# Patient Record
Sex: Male | Born: 1940
Health system: Southern US, Community
[De-identification: ages and names within clinical notes are randomized; demographics above are authoritative.]

## PROBLEM LIST (undated history)

## (undated) DIAGNOSIS — Z9989 Dependence on other enabling machines and devices: Secondary | ICD-10-CM

## (undated) DIAGNOSIS — IMO0002 Reserved for concepts with insufficient information to code with codable children: Secondary | ICD-10-CM

## (undated) DIAGNOSIS — C61 Malignant neoplasm of prostate: Secondary | ICD-10-CM

## (undated) DIAGNOSIS — K219 Gastro-esophageal reflux disease without esophagitis: Secondary | ICD-10-CM

## (undated) DIAGNOSIS — G4730 Sleep apnea, unspecified: Secondary | ICD-10-CM

## (undated) DIAGNOSIS — G7000 Myasthenia gravis without (acute) exacerbation: Secondary | ICD-10-CM

## (undated) DIAGNOSIS — I10 Essential (primary) hypertension: Secondary | ICD-10-CM

## (undated) HISTORY — DX: Myasthenia gravis without (acute) exacerbation: G70.00

## (undated) HISTORY — DX: Dependence on other enabling machines and devices: Z99.89

## (undated) HISTORY — DX: Essential (primary) hypertension: I10

## (undated) HISTORY — DX: Sleep apnea, unspecified: G47.30

## (undated) HISTORY — PX: COLONOSCOPY: SHX174

## (undated) HISTORY — PX: OTHER SURGICAL HISTORY: SHX169

## (undated) HISTORY — DX: Reserved for concepts with insufficient information to code with codable children: IMO0002

---

## 2011-07-10 DIAGNOSIS — Z125 Encounter for screening for malignant neoplasm of prostate: Secondary | ICD-10-CM | POA: Diagnosis not present

## 2011-07-10 DIAGNOSIS — E785 Hyperlipidemia, unspecified: Secondary | ICD-10-CM | POA: Diagnosis not present

## 2011-07-10 DIAGNOSIS — I10 Essential (primary) hypertension: Secondary | ICD-10-CM | POA: Diagnosis not present

## 2011-07-17 DIAGNOSIS — Z125 Encounter for screening for malignant neoplasm of prostate: Secondary | ICD-10-CM | POA: Diagnosis not present

## 2011-07-17 DIAGNOSIS — D126 Benign neoplasm of colon, unspecified: Secondary | ICD-10-CM | POA: Diagnosis not present

## 2011-07-17 DIAGNOSIS — Z0000 Encounter for general adult medical examination without abnormal findings: Secondary | ICD-10-CM | POA: Diagnosis not present

## 2011-07-17 DIAGNOSIS — I10 Essential (primary) hypertension: Secondary | ICD-10-CM | POA: Diagnosis not present

## 2011-07-17 DIAGNOSIS — E785 Hyperlipidemia, unspecified: Secondary | ICD-10-CM | POA: Diagnosis not present

## 2012-02-03 DIAGNOSIS — Z23 Encounter for immunization: Secondary | ICD-10-CM | POA: Diagnosis not present

## 2012-08-05 DIAGNOSIS — H02409 Unspecified ptosis of unspecified eyelid: Secondary | ICD-10-CM | POA: Diagnosis not present

## 2012-08-05 DIAGNOSIS — M6281 Muscle weakness (generalized): Secondary | ICD-10-CM | POA: Diagnosis not present

## 2012-08-05 DIAGNOSIS — G7000 Myasthenia gravis without (acute) exacerbation: Secondary | ICD-10-CM | POA: Diagnosis not present

## 2012-08-05 DIAGNOSIS — I10 Essential (primary) hypertension: Secondary | ICD-10-CM | POA: Diagnosis not present

## 2012-08-05 DIAGNOSIS — R29810 Facial weakness: Secondary | ICD-10-CM | POA: Diagnosis not present

## 2012-08-05 DIAGNOSIS — I499 Cardiac arrhythmia, unspecified: Secondary | ICD-10-CM | POA: Diagnosis not present

## 2012-08-06 ENCOUNTER — Encounter (INDEPENDENT_AMBULATORY_CARE_PROVIDER_SITE_OTHER): Payer: Medicare Other | Admitting: Radiology

## 2012-08-06 ENCOUNTER — Encounter: Payer: Self-pay | Admitting: Diagnostic Neuroimaging

## 2012-08-06 ENCOUNTER — Other Ambulatory Visit: Payer: Self-pay | Admitting: Internal Medicine

## 2012-08-06 ENCOUNTER — Ambulatory Visit
Admission: RE | Admit: 2012-08-06 | Discharge: 2012-08-06 | Disposition: A | Discharge status: Home / Self Care | Payer: Medicare Other | Source: Ambulatory Visit | Attending: Internal Medicine | Admitting: Internal Medicine

## 2012-08-06 ENCOUNTER — Ambulatory Visit (INDEPENDENT_AMBULATORY_CARE_PROVIDER_SITE_OTHER): Payer: Medicare Other | Admitting: Diagnostic Neuroimaging

## 2012-08-06 ENCOUNTER — Other Ambulatory Visit: Payer: Self-pay | Admitting: Diagnostic Neuroimaging

## 2012-08-06 VITALS — BP 154/88 | Ht 70.0 in | Wt 200.0 lb

## 2012-08-06 DIAGNOSIS — R5381 Other malaise: Secondary | ICD-10-CM

## 2012-08-06 DIAGNOSIS — G7000 Myasthenia gravis without (acute) exacerbation: Secondary | ICD-10-CM

## 2012-08-06 DIAGNOSIS — Z0289 Encounter for other administrative examinations: Secondary | ICD-10-CM

## 2012-08-06 DIAGNOSIS — R531 Weakness: Secondary | ICD-10-CM

## 2012-08-06 DIAGNOSIS — R5383 Other fatigue: Secondary | ICD-10-CM

## 2012-08-06 DIAGNOSIS — J984 Other disorders of lung: Secondary | ICD-10-CM | POA: Diagnosis not present

## 2012-08-06 MED ORDER — PYRIDOSTIGMINE BROMIDE 60 MG PO TABS: 30.0000 mg | ORAL_TABLET | Freq: Three times a day (TID) | ORAL | Status: DC

## 2012-08-06 MED ORDER — IOHEXOL 300 MG/ML  SOLN
50.0000 mL | Freq: Once | INTRAMUSCULAR | Status: AC | PRN
  Administered 2012-08-06: 50 mL via INTRAVENOUS

## 2012-08-06 NOTE — Progress Notes (Signed)
[facility]  PATIENT: Oscar Jacobs DOB: March 25, 1941  REFERRING CLINICIAN: Perini HISTORY FROM: patient and wife REASON FOR VISIT: new consult   HISTORICAL  CHIEF COMPLAINT:  Chief Complaint  Patient presents with  . Myasthenia Gravis    HISTORY OF PRESENT ILLNESS:   72 year old right-handed male with history of hypertension, seasonal allergies, here for evaluation of possible myasthenia gravis.  6 weeks ago patient noted left eyelid drooping. No double vision. Over the next few weeks he had increasing weakness in keeping his head upright as well as difficulty with talking and chewing. Since this past Saturday symptoms have been significantly worse. He denies any shortness of breath or choking problems. He has mild weakness in his arms and no weakness in his legs. No headache, confusion, chest pain. No similar symptoms in the past.  Patient does have diagnosis of sleep apnea, but he has not used his CPAP machine last 2 or 3 years.  REVIEW OF SYSTEMS: Full 14 system review of systems performed and notable only for fatigue, mild blurred vision, snoring, psoriasis, seasonal allergies.  ALLERGIES: Allergies  Allergen Reactions  . Penicillins     HOME MEDICATIONS: No outpatient prescriptions prior to visit.   No facility-administered medications prior to visit.    PAST MEDICAL HISTORY: Past Medical History  Diagnosis Date  . Sleep apnea   . Hypertension   . Squamous carcinoma     PAST SURGICAL HISTORY: No past surgical history on file.  FAMILY HISTORY: Family History  Problem Relation Age of Onset  . Heart disease Mother   . Cancer - Other Father   . Heart disease Sister   . Heart disease Brother   . Bipolar disorder Sister   . Bipolar disorder Sister     suicide  . Healthy Brother     SOCIAL HISTORY:  History   Social History  . Marital Status: Married    Spouse Name: Oscar Jacobs    Number of Children: 3  . Years of  Education: MD   Occupational History  . Retired     Radiology MD   Social History Main Topics  . Smoking status: Never Smoker   . Smokeless tobacco: Not on file  . Alcohol Use: Yes     Comment: seldom  . Drug Use: No  . Sexually Active: Not on file   Other Topics Concern  . Not on file   Social History Narrative      Pt lives at home with his spouse.   Caffeine Use- 4 cups daily.     PHYSICAL EXAM  Filed Vitals:   08/06/12 1404  BP: 154/88  Height: 5\' 10"  (1.778 m)  Weight: 200 lb (90.719 kg)   Body mass index is 28.7 kg/(m^2).  GENERAL EXAM: Patient is in no distress; PROGRESSIVE HEAD DROP DURING EXAM. LEFT > RIGHT PTOSIS. MILD DYSARTHRIA.  CARDIOVASCULAR: Regular rate and rhythm, no murmurs, no carotid bruits  NEUROLOGIC: MENTAL STATUS: awake, alert, language fluent, comprehension intact, naming intact CRANIAL NERVE: no papilledema on fundoscopic exam, pupils equal and reactive to light, visual fields full to confrontation, extraocular muscles intact, no nystagmus, facial sensation and strength symmetric, uvula midline, shoulder shrug symmetric, tongue midline. INCREASING LEFT PTOSIS AFTER SUSTAINED UPGAZE. MOTOR: BUE PROX WEAKNESS LEFT WORSE THAN RIGHT (DELT 4, BICEP 4, TRICEP 3). BUE DISTAL 5/5, BLE 5/5. NECK EXT WEAKNESS 2-3/5. NECK FLEX 4/5. SENSORY: normal and symmetric to light touch, pinprick, temperature, vibration and proprioception COORDINATION: finger-nose-finger, fine finger movements  normal REFLEXES: deep tendon reflexes present and symmetric; TRACE AT ANKLES. GAIT/STATION: narrow based gait; HEAD DROP.   DIAGNOSTIC DATA (LABS, IMAGING, TESTING) - I reviewed patient records, labs, notes, testing and imaging myself where available.  No results found for this basename: WBC, HGB, HCT, MCV, PLT   No results found for this basename: na, k, cl, co2, glucose, bun, creatinine, calcium, prot, albumin, ast, alt, alkphos, bilitot, gfrnonaa, gfraa   No  results found for this basename: CHOL, HDL, LDLCALC, LDLDIRECT, TRIG, CHOLHDL   No results found for this basename: HGBA1C   No results found for this basename: VITAMINB12   No results found for this basename: TSH   08/06/12 CT chest - no thymoma, small nodules within the thyroid gland are likely benign, left lower lobe pulmonary nodule, Bosniak I cyst of the right kidney is partially imaged, coronary artery calcifications.   ASSESSMENT AND PLAN  72 y.o. year old male  has a past medical history of Sleep apnea; Hypertension; and Squamous carcinoma. here with suspected new onset myasthenia gravis based on history and exam. Serum antibody testing pending.  I will try empiric mestinon. I will check EMG with repetitive stimulation testing. PFTs tomorrow. Low threshold for admission if symptoms progress. Patient's questions and concerns were addressed. Patient and spouse agree with the plan.   Orders Placed This Encounter  Procedures  . NCV with EMG(electromyography)    I reviewed records, ordered emergent testing and performed nerve conduction/EMG (which shows decremental response on repetitive stimulation), discussed with patient and other providers in the management of this high-risk medical condition, suspected myasthenia gravis exacerbation, requiring high complexity medical decision-making.   Suanne Marker, MD 08/06/2012, 4:51 PM Certified in Neurology, Neurophysiology and Neuroimaging  [facility] [address] [place], [place] [address] [phone]

## 2012-08-06 NOTE — Procedures (Signed)
   [facility]  NCS (NERVE CONDUCTION STUDY) WITH EMG (ELECTROMYOGRAPHY) REPORT   STUDY DATE: 08/06/12 PATIENT NAME: Oscar Jacobs DOB: [birth year] MRN: [id number]  ORDERING CLINICIAN: Joycelyn Schmid, MD   TECHNOLOGIST: Kaylyn Lim ELECTROMYOGRAPHER: Glenford Bayley. Penumalli, MD  CLINICAL INFORMATION: 72 year old male with progressive weakness, ptosis, dysarthria, head drop.  FINDINGS: NERVE CONDUCTION STUDY: Right median and right ulnar motor responses have normal distal latencies, amplitudes, conduction velocities and F-wave latencies. Right median, right ulnar, right radial sensory responses are normal.  Repetitive nerve stimulation of spinal accessory nerve and recording over the right trapezius muscle shows significant decremental response at baseline, immediately after exertion, and up to 90 seconds following exertion. Amplitude decrement of 30-36% is noted.  NEEDLE ELECTROMYOGRAPHY: Needle examination of selected muscles of the left upper extremity (deltoid, biceps, triceps, flexor carpi radialis, first as interosseous shows no abnormal spontaneous activity at rest and decreased motor unit recruitment of the left flexor carpi radialis muscle on exertion. No abnormal spontaneous activity and left C6-7 paraspinal muscles.  IMPRESSION:  This is an abnormal study demonstrating: 1. Decremental response on repetitive nerve stimulation study of greater than 30%, consistent with neuromuscular junction disorder such as myasthenia gravis. 2. No evidence of underlying myopathy or motor neuropathy. 3. Decreased motor unit recruitment of the left flexor carpi radialis muscle may indicate underlying cervical radiculopathy (C6, C7), but not confirmed on paraspinal muscle testing or nerve conduction studies.   INTERPRETING PHYSICIAN:  Suanne Marker, MD Certified in Neurology, Neurophysiology and Neuroimaging  [facility] [address], [address] [place], [place] [address] [phone]

## 2012-08-06 NOTE — Patient Instructions (Addendum)
Try Mestinon 30 mg 3 times a day; may increase to 60 mg 3 or 4 times per day as tolerated. Caution with shortness of breath, increasing weakness or trouble swallowing.

## 2012-08-06 NOTE — Patient Instructions (Signed)
Caution with shortness of breath. I will check EMG/NCS and try mestinon.

## 2012-08-07 ENCOUNTER — Ambulatory Visit (INDEPENDENT_AMBULATORY_CARE_PROVIDER_SITE_OTHER): Payer: Medicare Other | Admitting: Internal Medicine

## 2012-08-07 ENCOUNTER — Telehealth: Payer: Self-pay | Admitting: Diagnostic Neuroimaging

## 2012-08-07 DIAGNOSIS — R0602 Shortness of breath: Secondary | ICD-10-CM

## 2012-08-07 LAB — PULMONARY FUNCTION TEST

## 2012-08-07 NOTE — Telephone Encounter (Signed)
Called patient, and he is doing much better today on mestinon (30mg  every 6 hours). Will continue and next week we will discuss longer term immunosuppression.   -VRP

## 2012-08-07 NOTE — Progress Notes (Signed)
PFT done today. 

## 2012-08-13 ENCOUNTER — Encounter: Payer: Self-pay | Admitting: Diagnostic Neuroimaging

## 2012-08-13 ENCOUNTER — Ambulatory Visit (INDEPENDENT_AMBULATORY_CARE_PROVIDER_SITE_OTHER): Payer: Medicare Other | Admitting: Diagnostic Neuroimaging

## 2012-08-13 VITALS — BP 135/79 | HR 88 | Temp 97.5°F | Ht 70.0 in | Wt 193.0 lb

## 2012-08-13 DIAGNOSIS — G7000 Myasthenia gravis without (acute) exacerbation: Secondary | ICD-10-CM | POA: Insufficient documentation

## 2012-08-13 MED ORDER — PREDNISONE 10 MG PO TABS: ORAL_TABLET | ORAL | Status: DC

## 2012-08-13 MED ORDER — PYRIDOSTIGMINE BROMIDE 60 MG PO TABS: 30.0000 mg | ORAL_TABLET | Freq: Three times a day (TID) | ORAL | Status: DC

## 2012-08-13 NOTE — Progress Notes (Signed)
[facility]  PATIENT: Oscar Jacobs DOB: 12-25-1940  REFERRING CLINICIAN: Perini HISTORY FROM: patient and wife REASON FOR VISIT: follow up   HISTORICAL  CHIEF COMPLAINT:  Chief Complaint  Patient presents with  . Myasthenia Gravis    follow-up    HISTORY OF PRESENT ILLNESS:   UPDATE 08/13/12: Since last visit, EMG and lab testing confirm myasthenia gravis diagnosis. I prescribed Mestinon for patient. He has had a positive response. He tried 30 mg 3 times a day, but now takes 15 mg 6 times a day, do to see increasing secretions and diarrhea. Medications lasting approximately 4 hours. No shortness of breath. Continues to have left ptosis, some neck weakness and left arm weakness. Some mild slurred speech. No trouble swallowing.  PRIOR HPI (08/06/12): 72 year old right-handed male with history of hypertension, seasonal allergies, here for evaluation of possible myasthenia gravis.  6 weeks ago patient noted left eyelid drooping. No double vision. Over the next few weeks he had increasing weakness in keeping his head upright as well as difficulty with talking and chewing. Since this past Saturday symptoms have been significantly worse. He denies any shortness of breath or choking problems. He has mild weakness in his arms and no weakness in his legs. No headache, confusion, chest pain. No similar symptoms in the past.  Patient does have diagnosis of sleep apnea, but he has not used his CPAP machine last 2 or 3 years.  REVIEW OF SYSTEMS: Full 14 system review of systems performed and notable only for Weight loss, and increased secretions, diarrhea snoring cough cramps aching muscles Raynaud's slurred speech.  ALLERGIES: Allergies  Allergen Reactions  . Penicillins     HOME MEDICATIONS: Outpatient Prescriptions Prior to Visit  Medication Sig Dispense Refill  . aspirin 81 MG tablet Take 81 mg by mouth daily.      . cetirizine (ZYRTEC) 10 MG chewable tablet Chew 10  mg by mouth daily.      . Multiple Vitamins-Minerals (MENS 50+ MULTI VITAMIN/MIN) TABS Take 1 tablet by mouth.      Marland Kitchen omeprazole (PRILOSEC) 20 MG capsule Take 20 mg by mouth daily.      . ramipril (ALTACE) 10 MG capsule Take 10 mg by mouth daily.      Marland Kitchen pyridostigmine (MESTINON) 60 MG tablet Take 0.5 tablets (30 mg total) by mouth 3 (three) times daily.  90 tablet  3   No facility-administered medications prior to visit.    PAST MEDICAL HISTORY: Past Medical History  Diagnosis Date  . Sleep apnea   . Hypertension   . Squamous carcinoma   . Myasthenia gravis     PAST SURGICAL HISTORY: History reviewed. No pertinent past surgical history.  FAMILY HISTORY: Family History  Problem Relation Age of Onset  . Heart disease Mother   . Cancer - Other Father   . Heart disease Sister   . Heart disease Brother   . Bipolar disorder Sister   . Bipolar disorder Sister     suicide  . Healthy Brother     SOCIAL HISTORY:  History   Social History  . Marital Status: Married    Spouse Name: [name] Nature Vogelsang    Number of Children: 3  . Years of Education: MD   Occupational History  . Retired     Radiology MD   Social History Main Topics  . Smoking status: Never Smoker   . Smokeless tobacco: Not on file  . Alcohol Use: Yes  Comment: seldom  . Drug Use: No  . Sexually Active: Not on file   Other Topics Concern  . Not on file   Social History Narrative      Pt lives at home with his spouse.   Caffeine Use- 4 cups daily.     PHYSICAL EXAM  Filed Vitals:   08/13/12 1430  BP: 135/79  Pulse: 88  Temp: 97.5 F (36.4 C)  TempSrc: Oral  Height: 5\' 10"  (1.778 m)  Weight: 193 lb (87.544 kg)   Body mass index is 27.69 kg/(m^2).  GENERAL EXAM: Patient is in no distress  CARDIOVASCULAR: Regular rate and rhythm, no murmurs, no carotid bruits  NEUROLOGIC: MENTAL STATUS: awake, alert, language fluent, comprehension intact, naming intact CRANIAL NERVE: pupils  equal and reactive to light, visual fields full to confrontation, extraocular muscles intact, no nystagmus, facial sensation and strength symmetric, uvula midline, shoulder shrug symmetric, tongue midline. LEFT PTOSIS.  MOTOR: LUE PROX WEAKNESS (DELT 4, BICEP 4, TRICEP 4). ABLE TO COUNT UP TO 31 IN 1 BREATH. SENSORY: normal and symmetric to light touch. COORDINATION: finger-nose-finger, fine finger movements normal REFLEXES: deep tendon reflexes present and symmetric; TRACE AT ANKLES. GAIT/STATION: narrow based gait; MILD HEAD DROP.   DIAGNOSTIC DATA (LABS, IMAGING, TESTING) - I reviewed patient records, labs, notes, testing and imaging myself where available.  No results found for this basename: WBC,  HGB,  HCT,  MCV,  PLT   No results found for this basename: na,  k,  cl,  co2,  glucose,  bun,  creatinine,  calcium,  prot,  albumin,  ast,  alt,  alkphos,  bilitot,  gfrnonaa,  gfraa   No results found for this basename: CHOL,  HDL,  LDLCALC,  LDLDIRECT,  TRIG,  CHOLHDL   No results found for this basename: HGBA1C   No results found for this basename: VITAMINB12   No results found for this basename: TSH   08/06/12 CT chest - no thymoma, small nodules within the thyroid gland are likely benign, left lower lobe pulmonary nodule, Bosniak I cyst of the right kidney is partially imaged, coronary artery calcifications.  08/06/12 EMG/NCS  1. Decremental response on repetitive nerve stimulation study of greater than 30%, consistent with neuromuscular junction disorder such as myasthenia gravis. 2. No evidence of underlying myopathy or motor neuropathy. 3. Decreased motor unit recruitment of the left flexor carpi radialis muscle may indicate underlying cervical radiculopathy (C6, C7), but not confirmed on paraspinal muscle testing or nerve conduction studies.    ASSESSMENT AND PLAN  72 y.o. year old male  has a past medical history of Sleep apnea; Hypertension; and Squamous carcinoma. here  with new onset myasthenia gravis based on history and exam, EMG and lab testing (antibody testing positive per patient, but I don't have results myself). Patient having some positive response with Mestinon, but with persistent symptoms. Also having side effects of secretions and diarrhea(mild). Therefore I think patient will require long-term immunosuppression therapy. I recommend starting prednisone. I discussed possibility of starting concomitant Imuran or CellCept, but we will wait one month before deciding this.  PLAN: 1. Continue Mestinon 15 mg every 4 hours (6 doses per day) 2. Start prednisone 20 mg daily, increase by 5 mg per day every 3 days up to target dose of 60 mg per day; if symptoms are not well-controlled might have to continue up to 80 mg per day; I warned patient about small possibility of transient worsening as restart prednisone. This is why  I am using dose escalation strategy rather than immediate high-dose steroids 3. Daily calcium plus vitamin D supplement 4. Continue Nexium 5. Monitor caloric intake to avoid weight gain 6. followup PFT testing results  Meds ordered this encounter  Medications  . pyridostigmine (MESTINON) 60 MG tablet    Sig: Take 0.5 tablets (30 mg total) by mouth 3 (three) times daily.    Dispense:  90 tablet    Refill:  3  . predniSONE (DELTASONE) 10 MG tablet    Sig: Start 20mg  daily; every 3 days, increase by 5mg  per day, up to target dose of 60mg  daily    Dispense:  180 tablet    Refill:  12      Suanne Marker, MD 08/13/2012, 3:34 PM Certified in Neurology, Neurophysiology and Neuroimaging  [facility] [address] [place], [place] [address] [phone]

## 2012-08-13 NOTE — Patient Instructions (Signed)
Start prednisone 20mg  daily; every 3 days, increase by 5mg  per day, up to target dose of 60mg  daily.  Take over the counter calcium (1500mg ) + Vitamin D (400-800 IU) per day supplement.  Continue nexium  Watch calorie intake with steroid side effects.  Check CBC and BMP monthly while on prednisone.  For diarrhea, it is safe to try imodium (loperamide) which is a opioid-receptor agonist.  If any shortness of breath, go to ER.

## 2012-09-10 ENCOUNTER — Encounter: Payer: Self-pay | Admitting: Diagnostic Neuroimaging

## 2012-09-10 ENCOUNTER — Ambulatory Visit (INDEPENDENT_AMBULATORY_CARE_PROVIDER_SITE_OTHER): Payer: Medicare Other | Admitting: Diagnostic Neuroimaging

## 2012-09-10 VITALS — BP 138/82 | HR 74 | Ht 74.0 in | Wt 188.0 lb

## 2012-09-10 DIAGNOSIS — G7000 Myasthenia gravis without (acute) exacerbation: Secondary | ICD-10-CM | POA: Diagnosis not present

## 2012-09-10 MED ORDER — PREDNISONE 10 MG PO TABS: 60.0000 mg | ORAL_TABLET | Freq: Every day | ORAL | Status: DC

## 2012-09-10 NOTE — Progress Notes (Signed)
[facility]  PATIENT: Oscar Jacobs DOB: 1940-12-20  REFERRING CLINICIAN: Perini HISTORY FROM: patient and wife REASON FOR VISIT: follow up   HISTORICAL  CHIEF COMPLAINT:  No chief complaint on file.   HISTORY OF PRESENT ILLNESS:   UPDATE 09/10/12: Since last visit, doing much better on prednisone. Now on prednisone 60mg  daily. Taking calcium + Vit D, omeprazole. No side effects, except for slightly "buzzy" mood. Sleep apnea better. Strength improved. Almost back to normal. Some left ptosis persists. Some chewing difficulty towards end of meals.  UPDATE 08/13/12: Since last visit, EMG and lab testing confirm myasthenia gravis diagnosis. I prescribed Mestinon for patient. He has had a positive response. He tried 30 mg 3 times a day, but now takes 15 mg 6 times a day, do to see increasing secretions and diarrhea. Medications lasting approximately 4 hours. No shortness of breath. Continues to have left ptosis, some neck weakness and left arm weakness. Some mild slurred speech. No trouble swallowing.  PRIOR HPI (08/06/12): 72 year old right-handed male with history of hypertension, seasonal allergies, here for evaluation of possible myasthenia gravis.  6 weeks ago patient noted left eyelid drooping. No double vision. Over the next few weeks he had increasing weakness in keeping his head upright as well as difficulty with talking and chewing. Since this past Saturday symptoms have been significantly worse. He denies any shortness of breath or choking problems. He has mild weakness in his arms and no weakness in his legs. No headache, confusion, chest pain. No similar symptoms in the past.  Patient does have diagnosis of sleep apnea, but he has not used his CPAP machine last 2 or 3 years.  REVIEW OF SYSTEMS: Full 14 system review of systems performed and notable only for intermittent double vision, racing thoughts, mild weakness, mild slurred speech.  ALLERGIES: Allergies   Allergen Reactions  . Penicillins     HOME MEDICATIONS: Outpatient Prescriptions Prior to Visit  Medication Sig Dispense Refill  . aspirin 81 MG tablet Take 81 mg by mouth daily.      . cetirizine (ZYRTEC) 10 MG chewable tablet Chew 10 mg by mouth daily.      . Multiple Vitamins-Minerals (MENS 50+ MULTI VITAMIN/MIN) TABS Take 1 tablet by mouth.      Marland Kitchen omeprazole (PRILOSEC) 20 MG capsule Take 20 mg by mouth daily.      Marland Kitchen pyridostigmine (MESTINON) 60 MG tablet Take 0.5 tablets (30 mg total) by mouth 3 (three) times daily.  90 tablet  3  . ramipril (ALTACE) 10 MG capsule Take 10 mg by mouth daily.      . predniSONE (DELTASONE) 10 MG tablet Start 20mg  daily; every 3 days, increase by 5mg  per day, up to target dose of 60mg  daily  180 tablet  12   No facility-administered medications prior to visit.    PAST MEDICAL HISTORY: Past Medical History  Diagnosis Date  . Sleep apnea   . Hypertension   . Squamous carcinoma   . Myasthenia gravis     PAST SURGICAL HISTORY: No past surgical history on file.  FAMILY HISTORY: Family History  Problem Relation Age of Onset  . Heart disease Mother   . Cancer - Other Father   . Heart disease Sister   . Heart disease Brother   . Bipolar disorder Sister   . Bipolar disorder Sister     suicide  . Healthy Brother     SOCIAL HISTORY:  History   Social History  . Marital  Status: Married    Spouse Name: [name] Elven Laboy    Number of Children: 3  . Years of Education: MD   Occupational History  . Retired     Radiology MD   Social History Main Topics  . Smoking status: Never Smoker   . Smokeless tobacco: Not on file  . Alcohol Use: Yes     Comment: seldom  . Drug Use: No  . Sexually Active: Not on file   Other Topics Concern  . Not on file   Social History Narrative      Pt lives at home with his spouse.   Caffeine Use- 4 cups daily.     PHYSICAL EXAM  Filed Vitals:   09/10/12 1335  BP: 138/82  Pulse: 74  Height:  6\' 2"  (1.88 m)  Weight: 188 lb (85.276 kg)   Body mass index is 24.13 kg/(m^2).  GENERAL EXAM: Patient is in no distress  CARDIOVASCULAR: Regular rate and rhythm, no murmurs, no carotid bruits  NEUROLOGIC: MENTAL STATUS: awake, alert, language fluent, comprehension intact, naming intact CRANIAL NERVE: pupils equal and reactive to light, visual fields full to confrontation, extraocular muscles intact, no nystagmus, facial sensation and strength symmetric, uvula midline, shoulder shrug symmetric, tongue midline. LEFT PTOSIS.  MOTOR: LUE DELTOID 5-/5. RIGHT TRICEPS 5-/5. OTHERWISE FULL STRENGTH. SENSORY: normal and symmetric to light touch. COORDINATION: finger-nose-finger, fine finger movements normal REFLEXES: deep tendon reflexes present and symmetric; TRACE AT ANKLES. GAIT/STATION: narrow based gait   DIAGNOSTIC DATA (LABS, IMAGING, TESTING) - I reviewed patient records, labs, notes, testing and imaging myself where available.  No results found for this basename: WBC,  HGB,  HCT,  MCV,  PLT   No results found for this basename: na,  k,  cl,  co2,  glucose,  bun,  creatinine,  calcium,  prot,  albumin,  ast,  alt,  alkphos,  bilitot,  gfrnonaa,  gfraa   No results found for this basename: CHOL,  HDL,  LDLCALC,  LDLDIRECT,  TRIG,  CHOLHDL   No results found for this basename: HGBA1C   No results found for this basename: VITAMINB12   No results found for this basename: TSH   08/05/12 AchR binding 33.80 (h), AchR blocking 36% (h), AchR modulating 38% (h), anti-striational (1:80 (h)  08/05/12 B12 643, TSH 4.07, FT4 1.1, BUN/Cr 21/1.7  08/06/12 CT chest - no thymoma, small nodules within the thyroid gland are likely benign, left lower lobe pulmonary nodule, Bosniak I cyst of the right kidney is partially imaged, coronary artery calcifications.  08/06/12 EMG/NCS  1. Decremental response on repetitive nerve stimulation study of greater than 30%, consistent with neuromuscular junction  disorder such as myasthenia gravis. 2. No evidence of underlying myopathy or motor neuropathy. 3. Decreased motor unit recruitment of the left flexor carpi radialis muscle may indicate underlying cervical radiculopathy (C6, C7), but not confirmed on paraspinal muscle testing or nerve conduction studies.    ASSESSMENT AND PLAN  72 y.o. year old male  has a past medical history of Sleep apnea; Hypertension; Squamous carcinoma; and Myasthenia gravis. here with new onset myasthenia gravis based on history and exam, EMG and lab testing.  Doing well on mestinon +  Prednisone. Will continue and re-evaluate in 2 months. I think patient may require long-term immunosuppression therapy. I discussed possibility of starting concomitant Imuran or CellCept, but we will wait 2 more months before deciding this.  PLAN: 1. Continue Mestinon 30 mg 4x per 2. Continue prednisone 60mg  daily  3. Daily calcium plus vitamin D supplement 4. Continue Nexium 5. Monitor caloric intake to avoid weight gain     Suanne Marker, MD 09/10/2012, 2:07 PM Certified in Neurology, Neurophysiology and Neuroimaging  [facility] [address] [place], [place] [address] [phone]

## 2012-09-10 NOTE — Patient Instructions (Signed)
Continue current medications. 

## 2012-09-15 ENCOUNTER — Telehealth: Payer: Self-pay | Admitting: Neurology

## 2012-09-15 NOTE — Telephone Encounter (Signed)
I spoke with patient who expressed how well he was feeling and that he was very pleased with his care from our office.  His wife then got on the phone and felt that the patient was more hyper/excitable in the past week.  He is taking Prednisone 60mg  and is not sure if that has anything to do with his behavior, although the patient stated he doesn't feel any different.   I told her I would check with doctor.  They also mentioned that the patient was suppose to be taking Mestinon 90mg  daily but he took 120mg  daily, and realized that he was taking the incorrect dose.  He has started to take the 90mg  daily.

## 2012-09-17 ENCOUNTER — Emergency Department (HOSPITAL_COMMUNITY): Payer: Medicare Other

## 2012-09-17 ENCOUNTER — Telehealth: Payer: Self-pay | Admitting: *Deleted

## 2012-09-17 ENCOUNTER — Emergency Department (HOSPITAL_COMMUNITY)
Admission: EM | Admit: 2012-09-17 | Discharge: 2012-09-17 | Disposition: A | Discharge status: Home / Self Care | Payer: Medicare Other | Source: Home / Self Care | Attending: Emergency Medicine | Admitting: Emergency Medicine

## 2012-09-17 DIAGNOSIS — Z859 Personal history of malignant neoplasm, unspecified: Secondary | ICD-10-CM

## 2012-09-17 DIAGNOSIS — Z7982 Long term (current) use of aspirin: Secondary | ICD-10-CM

## 2012-09-17 DIAGNOSIS — Z880 Allergy status to penicillin: Secondary | ICD-10-CM

## 2012-09-17 DIAGNOSIS — F309 Manic episode, unspecified: Secondary | ICD-10-CM

## 2012-09-17 DIAGNOSIS — N179 Acute kidney failure, unspecified: Secondary | ICD-10-CM | POA: Diagnosis present

## 2012-09-17 DIAGNOSIS — G7000 Myasthenia gravis without (acute) exacerbation: Secondary | ICD-10-CM | POA: Diagnosis present

## 2012-09-17 DIAGNOSIS — D72829 Elevated white blood cell count, unspecified: Secondary | ICD-10-CM | POA: Diagnosis present

## 2012-09-17 DIAGNOSIS — Z79899 Other long term (current) drug therapy: Secondary | ICD-10-CM

## 2012-09-17 DIAGNOSIS — G4730 Sleep apnea, unspecified: Secondary | ICD-10-CM | POA: Diagnosis present

## 2012-09-17 DIAGNOSIS — K219 Gastro-esophageal reflux disease without esophagitis: Secondary | ICD-10-CM | POA: Diagnosis present

## 2012-09-17 DIAGNOSIS — I10 Essential (primary) hypertension: Secondary | ICD-10-CM | POA: Diagnosis present

## 2012-09-17 DIAGNOSIS — T380X5A Adverse effect of glucocorticoids and synthetic analogues, initial encounter: Secondary | ICD-10-CM | POA: Diagnosis present

## 2012-09-17 DIAGNOSIS — F3189 Other bipolar disorder: Secondary | ICD-10-CM | POA: Diagnosis present

## 2012-09-17 DIAGNOSIS — Z0000 Encounter for general adult medical examination without abnormal findings: Secondary | ICD-10-CM | POA: Diagnosis not present

## 2012-09-17 DIAGNOSIS — R7309 Other abnormal glucose: Secondary | ICD-10-CM | POA: Diagnosis present

## 2012-09-17 DIAGNOSIS — R062 Wheezing: Secondary | ICD-10-CM | POA: Diagnosis present

## 2012-09-17 DIAGNOSIS — F19982 Other psychoactive substance use, unspecified with psychoactive substance-induced sleep disorder: Principal | ICD-10-CM | POA: Diagnosis present

## 2012-09-17 LAB — ETHANOL: Alcohol, Ethyl (B): <11 mg/dL (ref 0–11)

## 2012-09-17 LAB — COMPREHENSIVE METABOLIC PANEL
ALT: 23 U/L (ref 0–53)
Alkaline Phosphatase: 54 U/L (ref 39–117)
BUN: 25 mg/dL — ABNORMAL HIGH (ref 6–23)
CO2: 25 mEq/L (ref 19–32)
Chloride: 103 mEq/L (ref 96–112)
GFR calc Af Amer: 61 mL/min — ABNORMAL LOW (ref >90)
GFR calc non Af Amer: 52 mL/min — ABNORMAL LOW (ref >90)
Glucose, Bld: 137 mg/dL — ABNORMAL HIGH (ref 70–99)
Potassium: 4.2 mEq/L (ref 3.5–5.1)
Sodium: 138 mEq/L (ref 135–145)
Total Bilirubin: 0.5 mg/dL (ref 0.3–1.2)

## 2012-09-17 LAB — ACETAMINOPHEN LEVEL: Acetaminophen (Tylenol), Serum: <15 ug/mL (ref 10–30)

## 2012-09-17 LAB — URINALYSIS, ROUTINE W REFLEX MICROSCOPIC
Glucose, UA: NEGATIVE mg/dL
Hgb urine dipstick: NEGATIVE
Leukocytes, UA: NEGATIVE
Protein, ur: NEGATIVE mg/dL
Specific Gravity, Urine: 1.023 (ref 1.005–1.030)
Urobilinogen, UA: 1 mg/dL (ref 0.0–1.0)

## 2012-09-17 LAB — CBC
HCT: 43.3 % (ref 39.0–52.0)
Hemoglobin: 14.9 g/dL (ref 13.0–17.0)
MCHC: 34.4 g/dL (ref 30.0–36.0)
RBC: 4.91 MIL/uL (ref 4.22–5.81)
WBC: 11.1 10*3/uL — ABNORMAL HIGH (ref 4.0–10.5)

## 2012-09-17 LAB — RAPID URINE DRUG SCREEN, HOSP PERFORMED: Opiates: NOT DETECTED

## 2012-09-17 MED ORDER — IBUPROFEN 200 MG PO TABS: 600.0000 mg | ORAL_TABLET | Freq: Three times a day (TID) | ORAL | Status: DC | PRN

## 2012-09-17 MED ORDER — ONDANSETRON HCL 4 MG PO TABS: 4.0000 mg | ORAL_TABLET | Freq: Three times a day (TID) | ORAL | Status: DC | PRN

## 2012-09-17 MED ORDER — NICOTINE 21 MG/24HR TD PT24: 21.0000 mg | MEDICATED_PATCH | Freq: Every day | TRANSDERMAL | Status: DC

## 2012-09-17 MED ORDER — ALUM & MAG HYDROXIDE-SIMETH 200-200-20 MG/5ML PO SUSP: 30.0000 mL | ORAL | Status: DC | PRN

## 2012-09-17 MED ORDER — ZOLPIDEM TARTRATE 5 MG PO TABS: 5.0000 mg | ORAL_TABLET | Freq: Every evening | ORAL | Status: DC | PRN

## 2012-09-17 MED ORDER — LORAZEPAM 1 MG PO TABS: 1.0000 mg | ORAL_TABLET | Freq: Three times a day (TID) | ORAL | Status: DC | PRN

## 2012-09-17 NOTE — ED Notes (Signed)
Tele psych being conducted at bedside.

## 2012-09-17 NOTE — Telephone Encounter (Signed)
I tried again and just reached wife and patient by cell phone. I recommend that they reduce prednisone to 50mg  daily starting tomorrow. After mania/psychosis subsides, will consider steroid sparing strategy again with imuran or cellcept.   Suanne Marker, MD 09/17/2012, 8:07 PM Certified in Neurology, Neurophysiology and Neuroimaging  [facility] [address] [place], [place] [address] [phone]

## 2012-09-17 NOTE — ED Provider Notes (Signed)
History     CSN: [id number]  Arrival date & time 09/17/12  1441   First MD Initiated Contact with Patient 09/17/12 1507      Chief Complaint  Patient presents with  . Medical Clearance    (Consider location/radiation/quality/duration/timing/severity/associated sxs/prior treatment) HPI Comments: 72 year old male with a past medical history of myasthenia gravis, sleep apnea and hypertension presents to the emergency department with his wife and niece with a complaint of acting more manic than normal. Wife and niece have noticed that patient has been in a manic state over the past week, worsening each day. Patient himself does not have a history of bipolar disorder, however there is a family history. He was diagnosed with myasthenia gravis on March 17 of this year, and since then he has been placed on both prednisone and Mestinon. Currently taking 60mg  prednisone daily along with 30mg  mestinon 4x daily. Prior to beginning these medications patient has not showed symptoms of a manic state. With the patient being confused, just increasingly manic. No other new medications. She is also unsure if patient is taking his Mestinon correctly. Denies suicidal or homicidal ideation, hallucinations. Denies drug or alcohol use. Denies fever, chills, chest pain, shortness of breath, lightheadedness, dizziness, gait difficulty, urinary or bowel changes, abdominal pain, nausea or vomiting. He saw his neurologist Dr. Marjory Lies 1 week ago who was please with how patient was doing at that time.  The history is provided by the patient, the spouse and a relative.    Past Medical History  Diagnosis Date  . Sleep apnea   . Hypertension   . Squamous carcinoma   . Myasthenia gravis     No past surgical history on file.  Family History  Problem Relation Age of Onset  . Heart disease Mother   . Cancer - Other Father   . Heart disease Sister   . Heart disease Brother   . Bipolar disorder Sister   . Bipolar  disorder Sister     suicide  . Healthy Brother     History  Substance Use Topics  . Smoking status: Never Smoker   . Smokeless tobacco: Not on file  . Alcohol Use: Yes     Comment: seldom      Review of Systems  Constitutional: Negative for fever and chills.  Respiratory: Negative for shortness of breath.   Cardiovascular: Negative for chest pain.  Gastrointestinal: Negative for nausea, vomiting and abdominal pain.  Genitourinary: Negative for dysuria, urgency, frequency and difficulty urinating.  Musculoskeletal: Negative for back pain.  Neurological: Negative for dizziness, speech difficulty, light-headedness, numbness and headaches.  Psychiatric/Behavioral: Positive for dysphoric mood. Negative for suicidal ideas, hallucinations and self-injury. The patient is hyperactive.   All other systems reviewed and are negative.    Allergies  Penicillins  Home Medications   Current Outpatient Rx  Name  Route  Sig  Dispense  Refill  . aspirin EC 81 MG tablet   Oral   Take 81 mg by mouth every morning.         . calcium-vitamin D (OSCAL WITH D) 500-200 MG-UNIT per tablet   Oral   Take 1 tablet by mouth 2 (two) times daily.         . cetirizine (ZYRTEC) 10 MG tablet   Oral   Take 10 mg by mouth daily.         . cholecalciferol (VITAMIN D) 1000 UNITS tablet   Oral   Take 1,000 Units by mouth daily.         Marland Kitchen  Multiple Vitamin (MULTIVITAMIN WITH MINERALS) TABS   Oral   Take 1 tablet by mouth daily.         Marland Kitchen omeprazole (PRILOSEC) 20 MG capsule   Oral   Take 20 mg by mouth every morning.          . predniSONE (DELTASONE) 10 MG tablet   Oral   Take 20 mg by mouth 3 (three) times daily.         Marland Kitchen pyridostigmine (MESTINON) 60 MG tablet   Oral   Take 15-30 mg by mouth 4 (four) times daily. 0.25 tab twice daily (am and afternoon), 0.5 tab twice daily (noon and night)         . ramipril (ALTACE) 10 MG capsule   Oral   Take 10 mg by mouth daily.            BP 195/87  Pulse 93  Temp(Src) 99.2 F (37.3 C) (Oral)  Resp 24  SpO2 96%  Physical Exam  Nursing note and vitals reviewed. Constitutional: He is oriented to person, place, and time. He appears well-developed and well-nourished. No distress. Face mask in place.  Sitting on exam bed with surgical cap, cloth over eyes, and face mask.  HENT:  Head: Normocephalic and atraumatic.  Mouth/Throat: Uvula is midline, oropharynx is clear and moist and mucous membranes are normal.  Eyes: Conjunctivae and EOM are normal. Pupils are equal, round, and reactive to light.  Neck: Normal range of motion. Neck supple.  Cardiovascular: Normal rate, regular rhythm and normal heart sounds.   Pulmonary/Chest: Effort normal and breath sounds normal. No respiratory distress. He has no wheezes. He has no rales.  Abdominal: Soft. Bowel sounds are normal. He exhibits no mass. There is no tenderness.  Musculoskeletal: Normal range of motion. He exhibits no edema.  Neurological: He is alert and oriented to person, place, and time. He has normal strength. He displays no atrophy. No cranial nerve deficit or sensory deficit. Coordination normal.  Skin: Skin is warm and dry. He is not diaphoretic.  Psychiatric: His speech is tangential. He is hyperactive. He expresses no homicidal and no suicidal ideation.  Manic mood.    ED Course  Procedures (including critical care time)  Labs Reviewed  CBC - Abnormal; Notable for the following:    WBC 11.1 (*)    All other components within normal limits  COMPREHENSIVE METABOLIC PANEL - Abnormal; Notable for the following:    Glucose, Bld 137 (*)    BUN 25 (*)    GFR calc non Af Amer 52 (*)    GFR calc Af Amer 61 (*)    All other components within normal limits  SALICYLATE LEVEL - Abnormal; Notable for the following:    Salicylate Lvl <2.0 (*)    All other components within normal limits  URINE RAPID DRUG SCREEN (HOSP PERFORMED)  URINALYSIS, ROUTINE W REFLEX  MICROSCOPIC  ETHANOL  ACETAMINOPHEN LEVEL    Date: 09/17/2012  Rate: 93  Rhythm: normal sinus rhythm  QRS Axis: normal  Intervals: normal  ST/T Wave abnormalities: normal  Conduction Disutrbances:none  Narrative Interpretation: no stemi  Old EKG Reviewed: none available   Dg Chest 2 View  09/17/2012   *RADIOLOGY REPORT*  Clinical Data:  Medical clearance, history squamous carcinoma, myasthenia gravis, hypertension  CHEST - 2 VIEW  Comparison: Chest CT 08/06/2012  Findings: Normal heart size, mediastinal contours, and pulmonary vascularity. Mildly tortuous thoracic aorta noted. Lungs hyperinflated but clear. No acute infiltrate, pleural effusion  or pneumothorax. Bones appear mildly demineralized.  IMPRESSION: Mildly hyperinflated lungs without acute infiltrate.   Original Report Authenticated By: Ulyses Southward, M.D.     1. Manic state       MDM  72 y/o male in manic state, possibly from prednisone/mestinon. No hx of manic episodes. No other symptoms present. Physical exam unremarkable other than hyperactive manic state. Obtaining labs including cbc, cmp, etoh, acetaminophen level, salicylate level, ua, uds, ekg. Will obtain telepsych and consult ACT team. Case discussed with Dr. Hyacinth Meeker who also evaluated patient and agrees with plan of care. 4:26 PM Spoke with ACT team who will evaluate patient. 5:16 PM [place] with ACT team evaluated patient and will try to have him placed in [place]. 6:00 PM [place] concerned psych changes are due to prednisone, will still try to place.  8:33 PM Telepsych complete, no admission. Does not feel as if this is psych related, rather due to prednisone. I spoke with Dr. Thad Ranger, neuro-hospitalist who does not recommend making any changes in his prednisone as it could cause worsening MG in a short period of time. She recommends patient call Dr. Richrd Humbles office first thing in the morning to schedule an appointment to discuss prednisone. I discussed  this with patient and family who state understanding of plan and are agreeable. Patient stable for discharge. Return precautions discussed.  Trevor Mace, PA-C 09/17/12 2038

## 2012-09-17 NOTE — ED Notes (Signed)
Patient is ambulatory and able to verbalize needs. Upset, however, due to the pulse ox being put on his finger with a hangnail. No obvious signs of infection noted in his left hand index finger. Pulse ox switched to another finger while wife applied hand sanitizer to it. PA in to eval patient. Triage information taken from wife.

## 2012-09-17 NOTE — BH Assessment (Addendum)
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 Patient was brought to the ED by both his spouse and oldest sister. They state that he was diagnosed with myasthenia gravis July 13, 2012. Since then he has been he has been placed on several new medications, including high doses of prednisone. He has been on Mestinon for the last 6 weeks as well as prednisone. Family feels that over the last week he has had increased erratic behaviors. He increasingly hyper, not sleeping for days at a time, and hyper religous. His sister took him on a 2 hour car ride to try and calm him down stating he became increasingly hyper religous. Says patient thought that God was calling him on a cell phone. He took the cell phone and held it to his ear for an hour waiting for God to give him "Elisabeth Pigeon powers". Family says that patient is confused at times but today he is oriented x4. Patient is also very talkative and has difficulty stopping himself from talking. However is very pleasant and making jokes with nursing staff calling her "Little Rascal". He is preoccupied during the assessment speaking of flying planes and says that everyone in the room is co-piloting. Patient also takes a surgical mask and places it over his mouth. He also takes a surgical bonnet and places it on his head. 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 Family is concerned as patient is not himself and has not been so over last week. He has no suicidal or homicidal thoughts. No history of psychosis or any mental health illness until 1 week ago. He has a strong family history of mental illness. No alcohol or drugs use reported.    Axis I: Psychotic Disorder NOS Axis II: Deferred Axis III:  Past Medical History  Diagnosis Date  . Sleep apnea   . Hypertension   . Squamous carcinoma   . Myasthenia gravis    Axis IV: other  psychosocial or environmental problems and problems related to social environment Axis V: 31-40 impairment in reality testing  Past Medical History:  Past Medical History  Diagnosis Date  . Sleep apnea   . Hypertension   . Squamous carcinoma   . Myasthenia gravis     No past surgical history on file.  Family History:  Family History  Problem Relation Age of Onset  . Heart disease Mother   . Cancer - Other Father   . Heart disease Sister   . Heart disease Brother   . Bipolar disorder Sister   . Bipolar disorder Sister     suicide  . Healthy Brother     Social History:  reports that he has never smoked. He does not have any smokeless tobacco history on file. He reports that  drinks alcohol. He reports that he does not use illicit drugs.  Additional Social History:  Alcohol / Drug Use Pain Medications: SEE MAR Prescriptions: SEE MAR Over the Counter: SEE MAR History of alcohol / drug use?: No history of alcohol / drug abuse  CIWA: CIWA-Ar BP: 195/87 mmHg Pulse Rate: 93 COWS:    Allergies:  Allergies  Allergen Reactions  . Penicillins Rash    Home Medications:  (Not in a hospital admission)  OB/GYN Status:  No LMP for male patient.  General Assessment Data Location of Assessment: [facility] Living Arrangements: Other (Comment) (  lives with spouse) Can pt return to current living arrangement?: Yes Admission Status: Voluntary Is patient capable of signing voluntary admission?: Yes Transfer from: Acute Hospital Referral Source: Self/Family/Friend     Risk to self Suicidal Ideation: No Suicidal Intent: No Is patient at risk for suicide?: No Suicidal Plan?: No Access to Means: No What has been your use of drugs/alcohol within the last 12 months?:  (no drug of alcohol use) Previous Attempts/Gestures: No How many times?:  (0) Other Self Harm Risks:  (n/a) Triggers for Past Attempts: Other (Comment) (none reported) Intentional Self Injurious Behavior:  None Family Suicide History: No Recent stressful life event(s): Other (Comment) (pt speaks of relationship issues with his spouse-communicati) Persecutory voices/beliefs?: No Depression: No Substance abuse history and/or treatment for substance abuse?: No Suicide prevention information given to non-admitted patients: Not applicable  Risk to Others Homicidal Ideation: No Thoughts of Harm to Others: No Current Homicidal Intent: No Current Homicidal Plan: No Access to Homicidal Means: No Identified Victim:  (n/a) History of harm to others?: No Assessment of Violence: None Noted Violent Behavior Description:  (patient is hyper, alert, and displays signs of mania) Does patient have access to weapons?: No Criminal Charges Pending?: No Does patient have a court date: No  Psychosis Hallucinations:  (Pt is hyper-religous and hyper-manic) Delusions: Grandiose;Unspecified (speaks of a conversation he had with Jesus)  Mental Status Report Appear/Hygiene: Disheveled Eye Contact: Fair Motor Activity: Freedom of movement Speech: Logical/coherent Level of Consciousness: Alert Mood: Depressed Affect: Appropriate to circumstance Anxiety Level: None Thought Processes: Coherent;Relevant Judgement: Impaired Orientation: Person;Time;Situation;Place Obsessive Compulsive Thoughts/Behaviors: None  Cognitive Functioning Concentration: Decreased Memory: Remote Intact;Recent Intact IQ: Average Insight: Good Impulse Control: Fair Appetite: Poor Weight Loss:  (none reported) Weight Gain:  (none reported) Sleep: Decreased Total Hours of Sleep:  (patient has not slept in 2-3 days) Vegetative Symptoms: None  ADLScreening [facility] Assessment Services) Patient's cognitive ability adequate to safely complete daily activities?: Yes Patient able to express need for assistance with ADLs?: Yes Independently performs ADLs?: Yes (appropriate for developmental age)  Abuse/Neglect [facility]) Physical Abuse:  Denies Verbal Abuse: Denies Sexual Abuse: Denies  Prior Inpatient Therapy Prior Inpatient Therapy: No Prior Therapy Dates:  (n/a) Prior Therapy Facilty/Provider(s):  (n/a) Reason for Treatment:  (n/a)  Prior Outpatient Therapy Prior Outpatient Therapy: No Prior Therapy Dates:  (n/a) Prior Therapy Facilty/Provider(s):  (n/a) Reason for Treatment:  (n/a)  ADL Screening (condition at time of admission) Patient's cognitive ability adequate to safely complete daily activities?: Yes Patient able to express need for assistance with ADLs?: Yes Independently performs ADLs?: Yes (appropriate for developmental age) Weakness of Legs: None Weakness of Arms/Hands: None  Home Assistive Devices/Equipment Home Assistive Devices/Equipment: None    Abuse/Neglect Assessment (Assessment to be complete while patient is alone) Physical Abuse: Denies Verbal Abuse: Denies Sexual Abuse: Denies Exploitation of patient/patient's resources: Denies Self-Neglect: Denies Values / Beliefs Cultural Requests During Hospitalization: None Spiritual Requests During Hospitalization: None   Advance Directives (For Healthcare) Advance Directive: Patient does not have advance directive Nutrition Screen- [facility] Patient's home diet: Regular  Additional Information 1:1 In Past 12 Months?: No CIRT Risk: No Elopement Risk: No Does patient have medical clearance?: Yes     Disposition:  Disposition Initial Assessment Completed for this Encounter: Yes Disposition of Patient: Inpatient treatment program Type of inpatient treatment program: Adult  On Site Evaluation by:   Reviewed with Physician:     Melynda Ripple [facility] 09/17/2012 5:32 PM

## 2012-09-17 NOTE — Telephone Encounter (Signed)
Wife calling for recommendations from Dr. Marjory Lies, pt is taking prednisone 60mg  po daily, mestinon 90mg  daily.  Since taking the prednisone, becomes more manic , will be taking to psychiatry. Please advise.

## 2012-09-17 NOTE — Telephone Encounter (Signed)
I called patient, but no answer. Then I reviewed hospital notes and appears that he is being evaluated in ER for psychosis. This could be related to the prednisone. I will discuss lowering the dose with patient's wife by phone, when I can reach her.  Suanne Marker, MD 09/17/2012, 7:56 PM Certified in Neurology, Neurophysiology and Neuroimaging  [facility] [address] [place], [place] [address] [phone]

## 2012-09-17 NOTE — ED Notes (Signed)
Patient has been incessantly talking about his family and his ancestors having psychosocial problems. Sister remains at the bedside. Telepsych set up at bedside.

## 2012-09-17 NOTE — ED Provider Notes (Signed)
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  He has no suicidal thoughts, no depression and no hx of same.    On exam heart, lungs, abdomena normal, MS clear but has pressured speech, tangential thoughts and slightly h yperreligious.  He is not a danger to himself or others.  Agree with psych w/u though could be electrolyte or prednisone related.  Medical screening examination/treatment/procedure(s) were conducted as a shared visit with non-physician practitioner(s) and myself.  I personally evaluated the patient during the encounter    Vida Roller, MD 09/17/12 [phone]

## 2012-09-17 NOTE — ED Notes (Signed)
Hx. myastenia gravis since July 13, 2012. Since then he has been he has been placed on several new medications, including high doses of prednisone.  Wife states he is not dx'd w/bipolar but there is a family hx. She states after the prednisone has been Rx'd he has shown signs of bipolar, esp in the last week.  States he is confused and manic at the present time.  Denies fevers, dysuria or any other s/s of infection.  Wife thinks he has also been take more of his pyridostigmine that he should be, but not to hurt himself.

## 2012-09-17 NOTE — BHH Counselor (Addendum)
Patient referred to [facility], [facility], [facility], and [facility].

## 2012-09-17 NOTE — BHH Counselor (Signed)
Pt referred to [facility], [facility], and [facility]. Patient to remain IVC at this time. He is pleasantly psychotic. He is oriented x4. He is in touch with reality and verbalized that he understands the process in order to remain voluntary. Family and patient made aware that if his state worsens or if ACT/SW feels that he is not in a position to make decisions for himself IVC could be initiated.

## 2012-09-18 NOTE — ED Provider Notes (Signed)
Medical screening examination/treatment/procedure(s) were conducted as a shared visit with non-physician practitioner(s) and myself.  I personally evaluated the patient during the encounter  Please see my separate respective documentation pertaining to this patient encounter   Vida Roller, MD 09/18/12 [phone]

## 2012-09-19 ENCOUNTER — Inpatient Hospital Stay (HOSPITAL_COMMUNITY)
Admission: EM | Admit: 2012-09-19 | Discharge: 2012-09-29 | DRG: 897 | Disposition: A | Discharge status: Home / Self Care | Payer: Medicare Other | Attending: Emergency Medicine | Admitting: Emergency Medicine

## 2012-09-19 ENCOUNTER — Encounter (HOSPITAL_COMMUNITY): Payer: Self-pay

## 2012-09-19 DIAGNOSIS — N179 Acute kidney failure, unspecified: Secondary | ICD-10-CM | POA: Diagnosis present

## 2012-09-19 DIAGNOSIS — T380X5A Adverse effect of glucocorticoids and synthetic analogues, initial encounter: Secondary | ICD-10-CM | POA: Diagnosis present

## 2012-09-19 DIAGNOSIS — G7000 Myasthenia gravis without (acute) exacerbation: Secondary | ICD-10-CM | POA: Diagnosis present

## 2012-09-19 DIAGNOSIS — F19988 Other psychoactive substance use, unspecified with other psychoactive substance-induced disorder: Secondary | ICD-10-CM

## 2012-09-19 DIAGNOSIS — R4182 Altered mental status, unspecified: Secondary | ICD-10-CM | POA: Diagnosis not present

## 2012-09-19 DIAGNOSIS — T38801A Poisoning by unspecified hormones and synthetic substitutes, accidental (unintentional), initial encounter: Secondary | ICD-10-CM | POA: Diagnosis present

## 2012-09-19 DIAGNOSIS — R000 Tachycardia, unspecified: Secondary | ICD-10-CM | POA: Diagnosis not present

## 2012-09-19 DIAGNOSIS — D72829 Elevated white blood cell count, unspecified: Secondary | ICD-10-CM | POA: Diagnosis present

## 2012-09-19 DIAGNOSIS — F3010 Manic episode without psychotic symptoms, unspecified: Secondary | ICD-10-CM

## 2012-09-19 DIAGNOSIS — R7309 Other abnormal glucose: Secondary | ICD-10-CM

## 2012-09-19 DIAGNOSIS — IMO0002 Reserved for concepts with insufficient information to code with codable children: Secondary | ICD-10-CM | POA: Diagnosis present

## 2012-09-19 DIAGNOSIS — I10 Essential (primary) hypertension: Secondary | ICD-10-CM | POA: Diagnosis present

## 2012-09-19 DIAGNOSIS — Z859 Personal history of malignant neoplasm, unspecified: Secondary | ICD-10-CM

## 2012-09-19 DIAGNOSIS — F3189 Other bipolar disorder: Secondary | ICD-10-CM | POA: Diagnosis present

## 2012-09-19 DIAGNOSIS — R739 Hyperglycemia, unspecified: Secondary | ICD-10-CM

## 2012-09-19 DIAGNOSIS — R062 Wheezing: Secondary | ICD-10-CM | POA: Diagnosis not present

## 2012-09-19 DIAGNOSIS — G4733 Obstructive sleep apnea (adult) (pediatric): Secondary | ICD-10-CM | POA: Diagnosis present

## 2012-09-19 DIAGNOSIS — Z818 Family history of other mental and behavioral disorders: Secondary | ICD-10-CM

## 2012-09-19 DIAGNOSIS — K219 Gastro-esophageal reflux disease without esophagitis: Secondary | ICD-10-CM | POA: Diagnosis present

## 2012-09-19 DIAGNOSIS — F309 Manic episode, unspecified: Secondary | ICD-10-CM | POA: Diagnosis not present

## 2012-09-19 DIAGNOSIS — F1999 Other psychoactive substance use, unspecified with unspecified psychoactive substance-induced disorder: Secondary | ICD-10-CM | POA: Diagnosis present

## 2012-09-19 HISTORY — DX: Gastro-esophageal reflux disease without esophagitis: K21.9

## 2012-09-19 LAB — CBC WITH DIFFERENTIAL/PLATELET
Basophils Relative: 0 % (ref 0–1)
HCT: 45.2 % (ref 39.0–52.0)
Hemoglobin: 15.8 g/dL (ref 13.0–17.0)
Lymphs Abs: 0.6 10*3/uL — ABNORMAL LOW (ref 0.7–4.0)
MCH: 30.8 pg (ref 26.0–34.0)
MCHC: 35 g/dL (ref 30.0–36.0)
Monocytes Absolute: 0.5 10*3/uL (ref 0.1–1.0)
Monocytes Relative: 4 % (ref 3–12)
Neutro Abs: 13.5 10*3/uL — ABNORMAL HIGH (ref 1.7–7.7)
RBC: 5.13 MIL/uL (ref 4.22–5.81)

## 2012-09-19 LAB — RAPID URINE DRUG SCREEN, HOSP PERFORMED
Amphetamines: NOT DETECTED
Barbiturates: NOT DETECTED
Benzodiazepines: NOT DETECTED
Cocaine: NOT DETECTED
Tetrahydrocannabinol: NOT DETECTED

## 2012-09-19 LAB — BASIC METABOLIC PANEL
BUN: 36 mg/dL — ABNORMAL HIGH (ref 6–23)
CO2: 25 mEq/L (ref 19–32)
Chloride: 99 mEq/L (ref 96–112)
Creatinine, Ser: 1.58 mg/dL — ABNORMAL HIGH (ref 0.50–1.35)
GFR calc Af Amer: 49 mL/min — ABNORMAL LOW (ref >90)
Glucose, Bld: 118 mg/dL — ABNORMAL HIGH (ref 70–99)

## 2012-09-19 MED ORDER — HYDROMORPHONE HCL PF 1 MG/ML IJ SOLN
1.0000 mg | INTRAMUSCULAR | Status: DC | PRN
  Filled 2012-09-19: qty 1

## 2012-09-19 MED ORDER — HYDRALAZINE HCL 20 MG/ML IJ SOLN: 10.0000 mg | INTRAMUSCULAR | Status: DC | PRN

## 2012-09-19 MED ORDER — ZIPRASIDONE MESYLATE 20 MG IM SOLR
10.0000 mg | Freq: Once | INTRAMUSCULAR | Status: AC
  Administered 2012-09-19: 10 mg via INTRAMUSCULAR
  Filled 2012-09-19: qty 20

## 2012-09-19 MED ORDER — ASPIRIN EC 81 MG PO TBEC
81.0000 mg | DELAYED_RELEASE_TABLET | Freq: Every morning | ORAL | Status: DC
  Administered 2012-09-20 – 2012-09-29 (×10): 81 mg via ORAL
  Filled 2012-09-19 (×10): qty 1

## 2012-09-19 MED ORDER — ENOXAPARIN SODIUM 40 MG/0.4ML ~~LOC~~ SOLN
40.0000 mg | SUBCUTANEOUS | Status: DC
  Administered 2012-09-19 – 2012-09-28 (×9): 40 mg via SUBCUTANEOUS
  Filled 2012-09-19 (×12): qty 0.4

## 2012-09-19 MED ORDER — ONDANSETRON HCL 4 MG PO TABS: 4.0000 mg | ORAL_TABLET | Freq: Four times a day (QID) | ORAL | Status: DC | PRN

## 2012-09-19 MED ORDER — LORATADINE 10 MG PO TABS
10.0000 mg | ORAL_TABLET | Freq: Every day | ORAL | Status: DC
  Administered 2012-09-19 – 2012-09-29 (×11): 10 mg via ORAL
  Filled 2012-09-19 (×11): qty 1

## 2012-09-19 MED ORDER — PYRIDOSTIGMINE BROMIDE 60 MG PO TABS
30.0000 mg | ORAL_TABLET | Freq: Three times a day (TID) | ORAL | Status: DC
  Administered 2012-09-19 – 2012-09-29 (×29): 30 mg via ORAL
  Filled 2012-09-19 (×34): qty 0.5

## 2012-09-19 MED ORDER — HYDROCODONE-ACETAMINOPHEN 5-325 MG PO TABS: 1.0000 | ORAL_TABLET | ORAL | Status: DC | PRN

## 2012-09-19 MED ORDER — POLYETHYLENE GLYCOL 3350 17 G PO PACK
17.0000 g | PACK | Freq: Every day | ORAL | Status: DC | PRN
  Filled 2012-09-19: qty 1

## 2012-09-19 MED ORDER — ACETAMINOPHEN 650 MG RE SUPP: 650.0000 mg | Freq: Four times a day (QID) | RECTAL | Status: DC | PRN

## 2012-09-19 MED ORDER — RISPERIDONE 0.5 MG PO TABS
0.5000 mg | ORAL_TABLET | Freq: Two times a day (BID) | ORAL | Status: DC
  Administered 2012-09-19 – 2012-09-28 (×18): 0.5 mg via ORAL
  Filled 2012-09-19 (×21): qty 1

## 2012-09-19 MED ORDER — CALCIUM CARBONATE-VITAMIN D 500-200 MG-UNIT PO TABS
1.0000 | ORAL_TABLET | Freq: Two times a day (BID) | ORAL | Status: DC
  Administered 2012-09-20 – 2012-09-29 (×18): 1 via ORAL
  Filled 2012-09-19 (×22): qty 1

## 2012-09-19 MED ORDER — PREDNISONE 50 MG PO TABS
50.0000 mg | ORAL_TABLET | Freq: Every day | ORAL | Status: DC
  Administered 2012-09-20: 50 mg via ORAL
  Filled 2012-09-19 (×4): qty 1

## 2012-09-19 MED ORDER — VITAMIN D3 25 MCG (1000 UNIT) PO TABS
1000.0000 [IU] | ORAL_TABLET | Freq: Every day | ORAL | Status: DC
  Administered 2012-09-20 – 2012-09-29 (×10): 1000 [IU] via ORAL
  Filled 2012-09-19 (×11): qty 1

## 2012-09-19 MED ORDER — ACETAMINOPHEN 325 MG PO TABS: 650.0000 mg | ORAL_TABLET | Freq: Four times a day (QID) | ORAL | Status: DC | PRN

## 2012-09-19 MED ORDER — PYRIDOSTIGMINE BROMIDE 60 MG PO TABS: 15.0000 mg | ORAL_TABLET | Freq: Four times a day (QID) | ORAL | Status: DC

## 2012-09-19 MED ORDER — ADULT MULTIVITAMIN W/MINERALS CH
1.0000 | ORAL_TABLET | Freq: Every day | ORAL | Status: DC
Start: 2012-09-19 — End: 2012-09-29
  Administered 2012-09-19 – 2012-09-29 (×11): 1 via ORAL
  Filled 2012-09-19 (×11): qty 1

## 2012-09-19 MED ORDER — PANTOPRAZOLE SODIUM 40 MG PO TBEC
40.0000 mg | DELAYED_RELEASE_TABLET | Freq: Every day | ORAL | Status: DC
  Administered 2012-09-19 – 2012-09-29 (×11): 40 mg via ORAL
  Filled 2012-09-19 (×8): qty 1

## 2012-09-19 MED ORDER — ONDANSETRON HCL 4 MG/2ML IJ SOLN: 4.0000 mg | Freq: Four times a day (QID) | INTRAMUSCULAR | Status: DC | PRN

## 2012-09-19 MED ORDER — ALUM & MAG HYDROXIDE-SIMETH 200-200-20 MG/5ML PO SUSP: 30.0000 mL | Freq: Four times a day (QID) | ORAL | Status: DC | PRN

## 2012-09-19 MED ORDER — SODIUM CHLORIDE 0.9 % IV SOLN
INTRAVENOUS | Status: DC
  Administered 2012-09-19: 21:00:00 via INTRAVENOUS

## 2012-09-19 NOTE — ED Notes (Signed)
Pt given urinal.

## 2012-09-19 NOTE — Progress Notes (Signed)
Oscar Jacobs [birth year] [id number]  Spoke with Arthor Captain, PA-C regarding patient.  Informed that I would like the issue of medication involvement to be cleared before putting a psychiatric diagnosis on a patient and having admitted to psychiatric hospital.  Provider will continue to taper prednisone and rule out behavior is related to medication.    Shuvon B. Rankin FNP-BC Family Nurse Practitioner, Board Certified

## 2012-09-19 NOTE — ED Notes (Signed)
Pt refusing geodon. PA notified

## 2012-09-19 NOTE — H&P (Signed)
[facility] History and Physical  Oscar Jacobs ZOX:[id number] DOB: 1940-05-24 DOA: 09/19/2012  Referring physician: Dr. Estell Harpin PCP: Ezequiel Kayser, MD   Chief Complaint: Psychosis   History of Present Illness: Oscar Jacobs is an 72 y.o. male with a PMH of myesthenia gravis, diagnosed 07/13/12, under the care of Dr. Danae Orleans, initially treated with Mestinon.  He subsequently was started on prednisone 20 mg daily on 08/13/12 with plans to steadily increase dose to a maintenance dose of 60 mg. The patient began to exhibit excitability with sleep disturbance, hyperreligiosity, confusion and hyperactivity approximately 7 days ago. He was evaluated by the ACT team on 09/17/12 for possible psychiatric hospitalization, but declined admission to [facility] as his symptoms were felt to be secondary to prednisone induced psychosis.  He was discharged home with instructions to follow up with Dr. Marjory Lies for dosage adjustment of his prednisone therapy.   He took prednisone this morning (dose reduced from 60 mg to 50 mg upon the advise of Dr. Marjory Lies, with plans for a slow taper).  The patient does not have a personal history of mental illnesses, but there is a strong family history of mental illnesses/bipolar disorder.  There has not been any suicidal or homicidal ideation, although the patient's wife feels unsafe in the home secondary to the patient's erratic behavior.    Review of Systems: Constitutional: No fever, no chills; Appetite normal; + weight loss of 25-30 lbs in 2 months (claims intentional), no weight gain.  HEENT: No blurry vision, no diplopia, no pharyngitis, no dysphagia CV: No chest pain, no palpitations.  Resp: No SOB, no cough. GI: No nausea, no vomiting, no diarrhea, no melena, no hematochezia.  GU: No dysuria, no hematuria.  MSK: no myalgias, no arthralgias.  Neuro:  No headache, + photophobia, no focal neurological deficits, + history of childhood seizures.  Psych: No depression, no anxiety.   Endo: No thyroid disease except incidentally discovered nodules, no DM, no heat intolerance, no cold intolerance, no polyuria, no polydipsia  Skin: No rashes, no skin lesions.  Heme: No easy bruising, no history of blood diseases.  Past Medical History Past Medical History  Diagnosis Date  . Sleep apnea   . Hypertension   . Squamous carcinoma   . Myasthenia gravis   . GERD (gastroesophageal reflux disease)      Past Surgical History Past Surgical History  Procedure Laterality Date  . Skin cancer removal       Social History: History   Social History  . Marital Status: Married    Spouse Name: [name] Gus Littler    Number of Children: 3  . Years of Education: MD   Occupational History  . Retired     Radiology MD   Social History Main Topics  . Smoking status: Never Smoker   . Smokeless tobacco: Not on file  . Alcohol Use: Yes     Comment: seldom  . Drug Use: No  . Sexually Active: Not on file   Other Topics Concern  . Not on file   Social History Narrative   Married.  Ambulates unassisted.   Pt lives at home with his spouse.   Caffeine Use- 4 cups daily.    Family History:  Family History  Problem Relation Age of Onset  . Heart disease Mother   . Cancer - Other Father   . Heart disease Sister   . Heart disease Brother   . Bipolar disorder Sister   . Bipolar disorder Sister  suicide  . Healthy Brother     Allergies: Penicillins  Meds: Prior to Admission medications   Medication Sig Start Date End Date Taking? Authorizing Provider  aspirin EC 81 MG tablet Take 81 mg by mouth every morning.   Yes Historical Provider, MD  calcium-vitamin D (OSCAL WITH D) 500-200 MG-UNIT per tablet Take 1 tablet by mouth 2 (two) times daily.   Yes Historical Provider, MD  cetirizine (ZYRTEC) 10 MG tablet Take 10 mg by mouth daily.   Yes Historical Provider, MD  cholecalciferol (VITAMIN D) 1000 UNITS tablet Take 1,000 Units by mouth daily.   Yes Historical  Provider, MD  Multiple Vitamin (MULTIVITAMIN WITH MINERALS) TABS Take 1 tablet by mouth daily.   Yes Historical Provider, MD  omeprazole (PRILOSEC) 20 MG capsule Take 20 mg by mouth every morning.    Yes Historical Provider, MD  predniSONE (DELTASONE) 10 MG tablet Take 20 mg by mouth 3 (three) times daily.   Yes Historical Provider, MD  pyridostigmine (MESTINON) 60 MG tablet Take 15-30 mg by mouth 4 (four) times daily. 0.25 tab twice daily (am and afternoon), 0.5 tab twice daily (noon and night)   Yes Historical Provider, MD  ramipril (ALTACE) 10 MG capsule Take 10 mg by mouth daily.   Yes Historical Provider, MD    Physical Exam: Filed Vitals:   09/19/12 1221 09/19/12 1230 09/19/12 1245 09/19/12 1300  BP: 184/92 169/90 144/80 136/76  Pulse: 104 107 99 100  Temp:      TempSrc:      Resp: 20     SpO2: 96% 97% 96% 94%     Physical Exam: Blood pressure 136/76, pulse 100, temperature 99 F (37.2 C), temperature source Oral, resp. rate 20, SpO2 94.00%. Gen: + Psychomotor agitation. Head: Normocephalic, atraumatic. Eyes: PERRL, EOMI, sclerae nonicteric. Mouth: Oropharynx clear, mucous membranes moist Neck: Supple, no thyromegaly, no lymphadenopathy, no jugular venous distention. Chest: Lungs CTAB. CV: Heart sounds are regular, no M/R/G. Abdomen: Soft, nontender, nondistended with normal active bowel sounds. Extremities: Extremities are without C/E/C. Skin: Warm and dry. Neuro: Alert and oriented times 3; cranial nerves II through XII grossly intact.  No motor deficits. Psych: + Tangentiality, +psychomotor agitation, but no frank psychosis or delusions illicited.  Labs on Admission:  Basic Metabolic Panel:  Recent Labs Lab 09/17/12 1530 09/19/12 1110  NA 138 137  K 4.2 4.1  CL 103 99  CO2 25 25  GLUCOSE 137* 118*  BUN 25* 36*  CREATININE 1.32 1.58*  CALCIUM 9.2 9.6   Liver Function Tests:  Recent Labs Lab 09/17/12 1530  AST 22  ALT 23  ALKPHOS 54  BILITOT 0.5   PROT 6.5  ALBUMIN 3.5   CBC:  Recent Labs Lab 09/17/12 1530 09/19/12 1110  WBC 11.1* 14.7*  NEUTROABS  --  13.5*  HGB 14.9 15.8  HCT 43.3 45.2  MCV 88.2 88.1  PLT 151 159   Radiological Exams on Admission: Dg Chest 2 View  09/17/2012   *RADIOLOGY REPORT*  Clinical Data:  Medical clearance, history squamous carcinoma, myasthenia gravis, hypertension  CHEST - 2 VIEW  Comparison: Chest CT 08/06/2012  Findings: Normal heart size, mediastinal contours, and pulmonary vascularity. Mildly tortuous thoracic aorta noted. Lungs hyperinflated but clear. No acute infiltrate, pleural effusion or pneumothorax. Bones appear mildly demineralized.  IMPRESSION: Mildly hyperinflated lungs without acute infiltrate.   Original Report Authenticated By: Ulyses Southward, M.D.    EKG: Independently reviewed. Done 09/17/12.  NSR.  93 beats per minute.  No ST-T wave abnormalities, poor R wave progression.  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 -May need anti-psychotics.  Will start Risperdal.  Titrate to resolution of symptoms.  Secondary school teacher. Active Problems:   GERD -Continue PPI therapy.   Myasthenia gravis -Dr. Cyril Mourning at bedside to evaluate patient. Management per neurology.   Leukocytosis -Non-toxic appearing, suspect de-margination effect of steroids.   Steroid-induced hyperglycemia -Mild.  Monitor fasting CBGs Q a.m.   AKI (acute kidney injury) -Likely from pre-renal causes.  Hold ACE-I.  Gently hydrate with NS x 12 hours.   HTN (hypertension) -Hold ACE-I secondary to AKI.  Can likely resume in 24-48 hours. -Hydralazine 10 mg IV PRN elevated BP.   Code Status: Full.   Family Communication: Niece at bedside. Disposition Plan: Home when stable.  Time spent: 1 hour.  Alyha Marines [facility] Pager [phone]  If 7PM-7AM, please contact night-coverage [email or web address] Password [email or web address] 09/19/2012, 2:15  PM

## 2012-09-19 NOTE — ED Notes (Signed)
He is quite animated and loud, protesting being brought here.  His family tell us they are concerned about pt's. Hyperactivity/manic behaviors, their ability to care for him, and his ability to care for himself.  Our tech. Recognizes him as having been seen here recently for similar S/S.  They further state this may be steroid-induced.  He is minimally cooperative with much coxing by his family two [employer] officers, and our security.

## 2012-09-19 NOTE — Consult Note (Signed)
NEURO HOSPITALIST CONSULT NOTE    Reason for Consult:  HPI:                                                                                                                                          Oscar Jacobs is an 72 y.o. male with a past medical history significant for hypertension, obstructive sleep apnea, GERD, and generalized myasthenia gravis, brought to [facility] due to progressive, worsening personality/behavioral changes. 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 Started on Mestinon but had subsequent development of head drop and weakness upper extremities and therefore prednisone 60 mg daily was added.  His wife is a retired Sports administrator and tells me that in the last week her husband had experienced a dramatic change in personality, very erratic behavior, hyper religiosity, doesn't sleep well, but no physically violent. Prednisone was lowered to 50 mg yesterday but no change in behavior noted. Evaluated by psychiatry who think patient's altered behavior is steroid induced. He is very hostile at the time of this assessment and is demanding to be allow to sleep and then he will decide whether or not he wants to speak with me. Stated that he feels threatened in this " extremely chaotic hospital environment". He has a very strong family history of psych disease, mainly bipolar disorder.       Past Medical History  Diagnosis Date  . Sleep apnea   . Hypertension   . Squamous carcinoma   . Myasthenia gravis   . GERD (gastroesophageal reflux disease)     Past Surgical History  Procedure Laterality Date  . Skin cancer removal      Family History  Problem Relation Age of Onset  . Heart disease Mother   . Cancer - Other Father   . Heart disease Sister   . Heart disease Brother   . Bipolar disorder Sister   . Bipolar disorder Sister     suicide  . Healthy Brother      Social History:  reports that he has  never smoked. He does not have any smokeless tobacco history on file. He reports that  drinks alcohol. He reports that he does not use illicit drugs.  Allergies  Allergen Reactions  . Penicillins Rash    MEDICATIONS:  I have reviewed the patient's current medications.   ROS:  Unable to obtain due to patient's altered mental status.                                                                                                                                     History obtained from chart review and wife.      Physical exam: agitated. Will defer physical exam.    Blood pressure 136/76, pulse 100, temperature 99 F (37.2 C), temperature source Oral, resp. rate 20, SpO2 94.00%.   Neurologic Examination:                                                                                                      Patient is very hostile and angry about the whole evaluation by different physicians and doesn't allow to be examined. Will postpone neuro-exam.  No results found for this basename: cbc, bmp, coags, chol, tri, ldl, hga1c    Results for orders placed during the hospital encounter of 09/19/12 (from the past 48 hour(s))  CBC WITH DIFFERENTIAL     Status: Abnormal   Collection Time    09/19/12 11:10 AM      Result Value Range   WBC 14.7 (*) 4.0 - 10.5 K/uL   RBC 5.13  4.22 - 5.81 MIL/uL   Hemoglobin 15.8  13.0 - 17.0 g/dL   HCT 16.1  09.6 - 04.5 %   MCV 88.1  78.0 - 100.0 fL   MCH 30.8  26.0 - 34.0 pg   MCHC 35.0  30.0 - 36.0 g/dL   RDW 40.9  81.1 - 91.4 %   Platelets 159  150 - 400 K/uL   Neutrophils Relative % 92 (*) 43 - 77 %   Neutro Abs 13.5 (*) 1.7 - 7.7 K/uL   Lymphocytes Relative 4 (*) 12 - 46 %   Lymphs Abs 0.6 (*) 0.7 - 4.0 K/uL   Monocytes Relative 4  3 - 12 %   Monocytes Absolute 0.5  0.1 - 1.0 K/uL   Eosinophils Relative 0  0 - 5 %   Eosinophils  Absolute 0.0  0.0 - 0.7 K/uL   Basophils Relative 0  0 - 1 %   Basophils Absolute 0.0  0.0 - 0.1 K/uL  BASIC METABOLIC PANEL     Status: Abnormal   Collection Time    09/19/12 11:10 AM      Result Value Range   Sodium 137  135 - 145 mEq/L  Potassium 4.1  3.5 - 5.1 mEq/L   Chloride 99  96 - 112 mEq/L   CO2 25  19 - 32 mEq/L   Glucose, Bld 118 (*) 70 - 99 mg/dL   BUN 36 (*) 6 - 23 mg/dL   Creatinine, Ser 9.60 (*) 0.50 - 1.35 mg/dL   Calcium 9.6  8.4 - 45.4 mg/dL   GFR calc non Af Amer 42 (*) >90 mL/min   GFR calc Af Amer 49 (*) >90 mL/min   Comment:            The eGFR has been calculated     using the CKD EPI equation.     This calculation has not been     validated in all clinical     situations.     eGFR's persistently     <90 mL/min signify     possible Chronic Kidney Disease.  ETHANOL     Status: None   Collection Time    09/19/12 11:10 AM      Result Value Range   Alcohol, Ethyl (B) <11  0 - 11 mg/dL   Comment:            LOWEST DETECTABLE LIMIT FOR     SERUM ALCOHOL IS 11 mg/dL     FOR MEDICAL PURPOSES ONLY  URINE RAPID DRUG SCREEN (HOSP PERFORMED)     Status: None   Collection Time    09/19/12  1:58 PM      Result Value Range   Opiates NONE DETECTED  NONE DETECTED   Cocaine NONE DETECTED  NONE DETECTED   Benzodiazepines NONE DETECTED  NONE DETECTED   Amphetamines NONE DETECTED  NONE DETECTED   Tetrahydrocannabinol NONE DETECTED  NONE DETECTED   Barbiturates NONE DETECTED  NONE DETECTED   Comment:            DRUG SCREEN FOR MEDICAL PURPOSES     ONLY.  IF CONFIRMATION IS NEEDED     FOR ANY PURPOSE, NOTIFY LAB     WITHIN 5 DAYS.                LOWEST DETECTABLE LIMITS     FOR URINE DRUG SCREEN     Drug Class       Cutoff (ng/mL)     Amphetamine      1000     Barbiturate      200     Benzodiazepine   200     Tricyclics       300     Opiates          300     Cocaine          300     THC              50    Dg Chest 2 View  09/17/2012   *RADIOLOGY  REPORT*  Clinical Data:  Medical clearance, history squamous carcinoma, myasthenia gravis, hypertension  CHEST - 2 VIEW  Comparison: Chest CT 08/06/2012  Findings: Normal heart size, mediastinal contours, and pulmonary vascularity. Mildly tortuous thoracic aorta noted. Lungs hyperinflated but clear. No acute infiltrate, pleural effusion or pneumothorax. Bones appear mildly demineralized.  IMPRESSION: Mildly hyperinflated lungs without acute infiltrate.   Original Report Authenticated By: Ulyses Southward, M.D.     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 I was not able to perform a neurological examination due  to patient's current agitation. Although he most likely has a steroid induced psychosis, a structural cause must be excluded and thus will suggest MRI brain and EEG. Can not abruptly stop prednisone, but will advise a rapid steroid taper over the following 5 days and use of IVIG in case of worsening MG symptomatology.   Switching to another immunosuppressant like Imuran is advisable, but this mediation will take at least 1-2 weeks to start working. Will continue to follow.  Ermalene Postin  [facility] [phone]  09/19/2012, 2:23 PM

## 2012-09-19 NOTE — ED Provider Notes (Signed)
History     CSN: [id number]  Arrival date & time 09/19/12  1039   First MD Initiated Contact with Patient 09/19/12 1055      Chief Complaint  Patient presents with  . Manic Behavior    His family state this may be steroid-induced    (Consider location/radiation/quality/duration/timing/severity/associated sxs/prior treatment) HPI  Oscar Jacobs is a 72 year old male with a history of Myasthenia Gravis who is brought to the emergency department for his erratic behavior.  Patient was seen here for the same complaint 2 days ago.  He had at [facility] psychiatry consult.  Psychiatrist felt that his behavior was related to his high dose prednisone use. He was instructed to follow up with his  Neurologist Dr. Marjory Lies.  The patient's wife states that she did call and speak with Dr. Elayne Snare mildly yesterday who recommended that the patient decrease his prednisone dose from 60 mg daily to 50 mg daily.  The patient is also taking pyridostigmine.  The patient's wife states that he has always been a bit hyper but over the past week his behavior has become increasingly erratic. he has not slept at all over the past 7 days.  He is up and doing all kinds of activities all the time.  The patient recently has become focused on spiritual ideology speaking about God".  The patient's wife is afraid that she may become the target of his aggression due to his erratic behavior, although she denies that he has been physically violent threatening homicide or suicidal ideation.  The patient's wife states that he has slowly increased his dose of prednisone since the end of March.  The patient has a very strong family history of bipolar disorder.  He has 2 sisters and a father with bipolar disorder.  One of his sisters committed suicide.  He also has a sixth first cousins with bipolar disorder.  Patient's wife states that he has never acted like this previously.  Past Medical History  Diagnosis Date  . Sleep apnea   .  Hypertension   . Squamous carcinoma   . Myasthenia gravis     History reviewed. No pertinent past surgical history.  Family History  Problem Relation Age of Onset  . Heart disease Mother   . Cancer - Other Father   . Heart disease Sister   . Heart disease Brother   . Bipolar disorder Sister   . Bipolar disorder Sister     suicide  . Healthy Brother     History  Substance Use Topics  . Smoking status: Never Smoker   . Smokeless tobacco: Not on file  . Alcohol Use: Yes     Comment: seldom      Review of Systems  Unable to perform ROS: Mental status change    Allergies  Penicillins  Home Medications   Current Outpatient Rx  Name  Route  Sig  Dispense  Refill  . aspirin EC 81 MG tablet   Oral   Take 81 mg by mouth every morning.         . calcium-vitamin D (OSCAL WITH D) 500-200 MG-UNIT per tablet   Oral   Take 1 tablet by mouth 2 (two) times daily.         . cetirizine (ZYRTEC) 10 MG tablet   Oral   Take 10 mg by mouth daily.         . cholecalciferol (VITAMIN D) 1000 UNITS tablet   Oral   Take 1,000 Units by mouth  daily.         . Multiple Vitamin (MULTIVITAMIN WITH MINERALS) TABS   Oral   Take 1 tablet by mouth daily.         Marland Kitchen omeprazole (PRILOSEC) 20 MG capsule   Oral   Take 20 mg by mouth every morning.          . predniSONE (DELTASONE) 10 MG tablet   Oral   Take 20 mg by mouth 3 (three) times daily.         Marland Kitchen pyridostigmine (MESTINON) 60 MG tablet   Oral   Take 15-30 mg by mouth 4 (four) times daily. 0.25 tab twice daily (am and afternoon), 0.5 tab twice daily (noon and night)         . ramipril (ALTACE) 10 MG capsule   Oral   Take 10 mg by mouth daily.           BP 154/102  Pulse 115  Temp(Src) 99 F (37.2 C) (Oral)  Resp 26  SpO2 94%  Physical Exam  Nursing note and vitals reviewed. Constitutional: He appears well-developed and well-nourished. No distress.  HENT:  Head: Normocephalic and atraumatic.   Eyes: Conjunctivae are normal. No scleral icterus.  Neck: Normal range of motion. Neck supple.  Cardiovascular: Normal rate, regular rhythm and normal heart sounds.   Pulmonary/Chest: Effort normal and breath sounds normal. No respiratory distress.  Abdominal: Soft. There is no tenderness.  Rectus diastasis  Musculoskeletal: He exhibits no edema.  Neurological: He is alert.  Skin: Skin is warm and dry. He is not diaphoretic.  Psychiatric: His affect is labile and inappropriate. His speech is rapid and/or pressured and tangential. He is hyperactive. Thought content is delusional. He expresses impulsivity.  Patient with a rapid tangential and delusional speech.  He's tells me that he can "make me younger or odor.  The patient speaks about God and the devil.  His sentence flow makes no sense.  Her speech is rapid and pressured.  He is appropriate and impulsive.    ED Course  Procedures (including critical care time)  Labs Reviewed  CBC WITH DIFFERENTIAL  BASIC METABOLIC PANEL  ETHANOL  URINE RAPID DRUG SCREEN (HOSP PERFORMED)   Dg Chest 2 View  09/17/2012   *RADIOLOGY REPORT*  Clinical Data:  Medical clearance, history squamous carcinoma, myasthenia gravis, hypertension  CHEST - 2 VIEW  Comparison: Chest CT 08/06/2012  Findings: Normal heart size, mediastinal contours, and pulmonary vascularity. Mildly tortuous thoracic aorta noted. Lungs hyperinflated but clear. No acute infiltrate, pleural effusion or pneumothorax. Bones appear mildly demineralized.  IMPRESSION: Mildly hyperinflated lungs without acute infiltrate.   Original Report Authenticated By: Ulyses Southward, M.D.     1. Manic behavior   2. Myasthenia gravis       MDM  11:47 AM Patient here with manic behavior.  It is unclear as to the etiology of his manic psychosis.  This may be a side effect of his prednisone however I am unsure if he is also having his first manic break due to his strong family history.  I spoke with Dr.  Leroy Kennedy , he states that he may need a rapid prednisone taper to come off of this-year-old lady and treat his myasthenia with the pyridostigmine alone.  However he asked that we speak again with psychiatry before we make it a medical admission..  I spoke in today with the psychiatric nurse practitioner on-call at our emergency department.  She states that she will consult on  the patient, she however feels uncomfortable until this is ruled out medically due to the fact that the patient will be diagnosed with a psychiatric disorder and started on medications.  The patient's lab work is currently pending.  1:10 PM BP 144/80  Pulse 99  Temp(Src) 99 F (37.2 C) (Oral)  Resp 20  SpO2 96% Patient with small elevation in his creatinine. UA was negative two days ago and I do not suspect infection as the cause of his underlying psychosis. I suspect leukocytosis is due to steroid use. I have spoken with Dr. Leroy Kennedy who will consult on the patient. Patient will be admitted to medicine for steroid taper to assess whether his condition is related to steroid use or psychiatric disorder.   1:32 PM Dr. Darnelle Catalan will admit the patient. Dr. Leroy Kennedy to consult.   Arthor Captain, PA-C 09/19/12 1333

## 2012-09-20 ENCOUNTER — Encounter (HOSPITAL_COMMUNITY): Payer: Self-pay

## 2012-09-20 DIAGNOSIS — F1999 Other psychoactive substance use, unspecified with unspecified psychoactive substance-induced disorder: Secondary | ICD-10-CM

## 2012-09-20 DIAGNOSIS — F309 Manic episode, unspecified: Secondary | ICD-10-CM | POA: Diagnosis not present

## 2012-09-20 DIAGNOSIS — T380X5A Adverse effect of glucocorticoids and synthetic analogues, initial encounter: Secondary | ICD-10-CM

## 2012-09-20 LAB — BASIC METABOLIC PANEL
BUN: 27 mg/dL — ABNORMAL HIGH (ref 6–23)
Calcium: 8.7 mg/dL (ref 8.4–10.5)
Creatinine, Ser: 1.41 mg/dL — ABNORMAL HIGH (ref 0.50–1.35)
GFR calc Af Amer: 56 mL/min — ABNORMAL LOW (ref >90)
GFR calc non Af Amer: 48 mL/min — ABNORMAL LOW (ref >90)
Potassium: 3.5 mEq/L (ref 3.5–5.1)

## 2012-09-20 MED ORDER — PREDNISONE 20 MG PO TABS
40.0000 mg | ORAL_TABLET | Freq: Every day | ORAL | Status: AC
  Administered 2012-09-21: 40 mg via ORAL
  Filled 2012-09-20: qty 2

## 2012-09-20 NOTE — Progress Notes (Signed)
NEURO HOSPITALIST PROGRESS NOTE   SUBJECTIVE:                                                                                                                        Less agitated today but still with pressure speech and racing thoughts. Psychiatry notes reviewed. He was too restless and agitated for MRI last night. Received 50 mg prednisone today. Denies shortness of breath, double vision, head weakness, worsening eye ptosis, difficulty swallowing or speaking.  OBJECTIVE:                                                                                                                           Vital signs in last 24 hours: Temp:  [97.4 F (36.3 C)-97.6 F (36.4 C)] 97.6 F (36.4 C) (05/25 1405) Pulse Rate:  [94-120] 120 (05/25 1405) Resp:  [16-20] 16 (05/25 1405) BP: (132-136)/(66-70) 132/66 mmHg (05/25 1405) SpO2:  [97 %-98 %] 97 % (05/25 1405) Weight:  [85.276 kg (188 lb)] 85.276 kg (188 lb) (05/25 0500)  Intake/Output from previous day: 05/24 0701 - 05/25 0700 In: 380 [P.O.:240; I.V.:140] Out: 1300 [Urine:1300] Intake/Output this shift:   Nutritional status: General  Past Medical History  Diagnosis Date  . Sleep apnea   . Hypertension   . Squamous carcinoma   . Myasthenia gravis   . GERD (gastroesophageal reflux disease)      Neurologic Exam:  Mental status: alert, awake, and oriented. Comprehension, naming, and repetition intact. No dysarthria or dysphasia. CN  2-12: significant for bilateral, left greater than right eyelid ptosis. Motor: moves all limbs symmetrically. Sensory: no tested. DTR's: no tested. Plantars: no tested. Coordination and gait: no tested.  Lab Results: No results found for this basename: cbc, bmp, coags, chol, tri, ldl, hga1c   Lipid Panel No results found for this basename: CHOL, TRIG, HDL, CHOLHDL, VLDL, LDLCALC,  in the last 72 hours  Studies/Results: No results found.  MEDICATIONS  I have reviewed the patient's current medications.  ASSESSMENT/PLAN:                                                                                                             72 y/o with subacute psychosis, likely steroid induced.  Recommend: 1) continue tapering down prednisone and closely monitor for evidence of MG worsening: will give 40 mg tomorrow am. 2) Will re attempt MRI brain in am. 3) EEG 4) Will follow up.   Wyatt Portela, MD [facility] [phone]  09/20/2012, 5:35 PM

## 2012-09-20 NOTE — Consult Note (Signed)
Reason for Consult:Psychosis NOS or secondary to Steroids Referring Physician: Dr. Barry Dienes Sarr is an 72 y.o. male.  HPI: Patient was seen and chart reviewed case discussed with this her spouse and daughter. Reportedly he was diagnosed with myasthenia gravis July 13, 2012 and  then he has been placed on several new medications, including high doses of prednisone and Mestinon for the last 6 weeks.  Patient family reports that over the last week he has had increased erratic behaviors, increasingly hyperactive,  decreased need for sleep, increased goal-directed activities and grandiose.  Patient reports racing thoughts, distractibility, and  decreased concentration. Patient is  needed repeated recurrent redirections from pressured speech and tangential and circumstantial thoughts. Patient Is  very pleasant without irritability and anger outbursts. Family is concerned as patient is not himself and has not been so over last week. He has no suicidal or homicidal thoughts. He has a strong family history of mental illness. No alcohol or drugs use reported.  Patient's family reported he was slept well last night after 48 hours. Patient and family wanted to continue his current medications risperidone and hoping to reduce the prednisone more to control his symptoms.  MSE: Patient is a wake, alert, oriented time, place, person, and situation. Patient has increased/pressured speech, loud than normal, elevated mood and animated euphoric affect. He has tangential and circumstantial thought process but denied suicidal or homicidal ideations intentions or plans. He has no evidence of delusions, paranoia, auditory and visual hallucinations. Patient has fair insight, and judgment are poor impulse control.  Past Medical History  Diagnosis Date  . Sleep apnea   . Hypertension   . Squamous carcinoma   . Myasthenia gravis   . GERD (gastroesophageal reflux disease)     Past Surgical History  Procedure Laterality  Date  . Skin cancer removal      Family History  Problem Relation Age of Onset  . Heart disease Mother   . Cancer - Other Father   . Heart disease Sister   . Heart disease Brother   . Bipolar disorder Sister   . Bipolar disorder Sister     suicide  . Healthy Brother     Social History:  reports that he has never smoked. He does not have any smokeless tobacco history on file. He reports that  drinks alcohol. He reports that he does not use illicit drugs.  Allergies:  Allergies  Allergen Reactions  . Penicillins Rash    Medications: I have reviewed the patient's current medications.  Results for orders placed during the hospital encounter of 09/19/12 (from the past 48 hour(s))  CBC WITH DIFFERENTIAL     Status: Abnormal   Collection Time    09/19/12 11:10 AM      Result Value Range   WBC 14.7 (*) 4.0 - 10.5 K/uL   RBC 5.13  4.22 - 5.81 MIL/uL   Hemoglobin 15.8  13.0 - 17.0 g/dL   HCT 16.1  09.6 - 04.5 %   MCV 88.1  78.0 - 100.0 fL   MCH 30.8  26.0 - 34.0 pg   MCHC 35.0  30.0 - 36.0 g/dL   RDW 40.9  81.1 - 91.4 %   Platelets 159  150 - 400 K/uL   Neutrophils Relative % 92 (*) 43 - 77 %   Neutro Abs 13.5 (*) 1.7 - 7.7 K/uL   Lymphocytes Relative 4 (*) 12 - 46 %   Lymphs Abs 0.6 (*) 0.7 - 4.0 K/uL  Monocytes Relative 4  3 - 12 %   Monocytes Absolute 0.5  0.1 - 1.0 K/uL   Eosinophils Relative 0  0 - 5 %   Eosinophils Absolute 0.0  0.0 - 0.7 K/uL   Basophils Relative 0  0 - 1 %   Basophils Absolute 0.0  0.0 - 0.1 K/uL  BASIC METABOLIC PANEL     Status: Abnormal   Collection Time    09/19/12 11:10 AM      Result Value Range   Sodium 137  135 - 145 mEq/L   Potassium 4.1  3.5 - 5.1 mEq/L   Chloride 99  96 - 112 mEq/L   CO2 25  19 - 32 mEq/L   Glucose, Bld 118 (*) 70 - 99 mg/dL   BUN 36 (*) 6 - 23 mg/dL   Creatinine, Ser 7.82 (*) 0.50 - 1.35 mg/dL   Calcium 9.6  8.4 - 95.6 mg/dL   GFR calc non Af Amer 42 (*) >90 mL/min   GFR calc Af Amer 49 (*) >90 mL/min    Comment:            The eGFR has been calculated     using the CKD EPI equation.     This calculation has not been     validated in all clinical     situations.     eGFR's persistently     <90 mL/min signify     possible Chronic Kidney Disease.  ETHANOL     Status: None   Collection Time    09/19/12 11:10 AM      Result Value Range   Alcohol, Ethyl (B) <11  0 - 11 mg/dL   Comment:            LOWEST DETECTABLE LIMIT FOR     SERUM ALCOHOL IS 11 mg/dL     FOR MEDICAL PURPOSES ONLY  URINE RAPID DRUG SCREEN (HOSP PERFORMED)     Status: None   Collection Time    09/19/12  1:58 PM      Result Value Range   Opiates NONE DETECTED  NONE DETECTED   Cocaine NONE DETECTED  NONE DETECTED   Benzodiazepines NONE DETECTED  NONE DETECTED   Amphetamines NONE DETECTED  NONE DETECTED   Tetrahydrocannabinol NONE DETECTED  NONE DETECTED   Barbiturates NONE DETECTED  NONE DETECTED   Comment:            DRUG SCREEN FOR MEDICAL PURPOSES     ONLY.  IF CONFIRMATION IS NEEDED     FOR ANY PURPOSE, NOTIFY LAB     WITHIN 5 DAYS.                LOWEST DETECTABLE LIMITS     FOR URINE DRUG SCREEN     Drug Class       Cutoff (ng/mL)     Amphetamine      1000     Barbiturate      200     Benzodiazepine   200     Tricyclics       300     Opiates          300     Cocaine          300     THC              50  BASIC METABOLIC PANEL     Status: Abnormal   Collection Time    09/20/12  4:53 AM      Result Value Range   Sodium 138  135 - 145 mEq/L   Potassium 3.5  3.5 - 5.1 mEq/L   Chloride 102  96 - 112 mEq/L   CO2 28  19 - 32 mEq/L   Glucose, Bld 82  70 - 99 mg/dL   BUN 27 (*) 6 - 23 mg/dL   Creatinine, Ser 1.61 (*) 0.50 - 1.35 mg/dL   Calcium 8.7  8.4 - 09.6 mg/dL   GFR calc non Af Amer 48 (*) >90 mL/min   GFR calc Af Amer 56 (*) >90 mL/min   Comment:            The eGFR has been calculated     using the CKD EPI equation.     This calculation has not been     validated in all clinical      situations.     eGFR's persistently     <90 mL/min signify     possible Chronic Kidney Disease.  PRO B NATRIURETIC PEPTIDE     Status: None   Collection Time    09/20/12  4:53 AM      Result Value Range   Pro B Natriuretic peptide (BNP) 117.2  0 - 125 pg/mL    No results found.  Positive for mood swings and hyperverbal, grandiose, and racing thoughts.  Blood pressure 136/70, pulse 94, temperature 97.4 F (36.3 C), temperature source Oral, resp. rate 20, height 6\' 2"  (1.88 m), weight 188 lb (85.276 kg), SpO2 98.00%.   Assessment/Plan: Psychosis secondary Steroid medication  Recommendation:  1. Patient does not meet criteria for acute psychiatric hospitalization  2. Continue current treatment plan and monitor for the adverse effects  3. Waiting for Steroid taper plans from neurologic consult  4. Psychiatric consultation service follow up as clinically required.   Reynalda Canny,JANARDHAHA R. 09/20/2012, 2:17 PM

## 2012-09-20 NOTE — ED Provider Notes (Signed)
Medical screening examination/treatment/procedure(s) were performed by non-physician practitioner and as supervising physician I was immediately available for consultation/collaboration.   Benny Lennert, MD 09/20/12 1039

## 2012-09-20 NOTE — Progress Notes (Signed)
[facility] PROGRESS NOTE  Oscar Jacobs ZOX:[id number] DOB: [birth year] DOA: 09/19/2012 PCP: Ezequiel Kayser, MD  Brief narrative: Oscar Jacobs is an 72 y.o. male with a past medical history of myasthenia gravis who was admitted on 09/19/2012 with concerns for prednisone-induced psychosis. He is being followed by neurology for recommendations on how to best taper her prednisone. Psychiatry consultation has also been requested for assistance with management of his acute psychosis.  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 IVIG may be needed to control myasthenia symptoms if recur with steroid taper. -MRI/EEG recommended by neurology. -Continue Risperdal. Psychiatry consultation requested to assist with management of psychosis.  -Patent attorney.  Active Problems:  Wheeze -No h/o asthma or COPD.  NSL IV.  Check proBNP. GERD  -Continue PPI therapy.  Leukocytosis  -Non-toxic appearing, suspect de-margination effect of steroids.  Steroid-induced hyperglycemia  -Mild. Monitor fasting CBGs Q a.m.  AKI (acute kidney injury)  -Likely from pre-renal causes. Hold ACE-I. Given gentle IVF x 12 hours.  HTN (hypertension)  -Hold ACE-I secondary to AKI. Can likely resume in 24-48 hours.  -Hydralazine 10 mg IV PRN elevated BP.   Code Status: Full.  Family Communication: Niece at bedside.  Disposition Plan: Home when stable.   Medical Consultants:  Dr. Wyatt Portela, Neurology.  Psychiatry.  Other Consultants:  None.  Anti-infectives:  None.  HPI/Subjective: Oscar Jacobs is still having periods of psychosis per nursing staff. He is appropriate with me today. The nurses tell me that he believes he is flying an airplane. He also has extreme germ phobia.  Objective: Filed Vitals:   09/19/12 1500 09/19/12 1649 09/19/12 2100 09/20/12 0500  BP: 135/69 169/85 136/70    Pulse:  112 94   Temp:  97.3 F (36.3 C) 97.4 F (36.3 C)   TempSrc:  Oral Oral   Resp:  16 20   Height:    6\' 2"  (1.88 m)  Weight:    85.276 kg (188 lb)  SpO2:  99% 98%     Intake/Output Summary (Last 24 hours) at 09/20/12 8119 Last data filed at 09/20/12 1478  Gross per 24 hour  Intake    380 ml  Output   1300 ml  Net   -920 ml    Exam: Gen:  NAD Cardiovascular:  RRR, No M/R/G Respiratory:  Lungs diminished with some high-pitched expiratory wheezes, new from yesterday. Gastrointestinal:  Abdomen soft, NT/ND, + BS Extremities:  No C/E/C  Data Reviewed: Basic Metabolic Panel:  Recent Labs Lab 09/17/12 1530 09/19/12 1110 09/20/12 0453  NA 138 137 138  K 4.2 4.1 3.5  CL 103 99 102  CO2 25 25 28   GLUCOSE 137* 118* 82  BUN 25* 36* 27*  CREATININE 1.32 1.58* 1.41*  CALCIUM 9.2 9.6 8.7   GFR Estimated Creatinine Clearance: 55.1 ml/min (by C-G formula based on Cr of 1.41). Liver Function Tests:  Recent Labs Lab 09/17/12 1530  AST 22  ALT 23  ALKPHOS 54  BILITOT 0.5  PROT 6.5  ALBUMIN 3.5   CBC:  Recent Labs Lab 09/17/12 1530 09/19/12 1110  WBC 11.1* 14.7*  NEUTROABS  --  13.5*  HGB 14.9 15.8  HCT 43.3 45.2  MCV 88.2 88.1  PLT 151 159   Microbiology No results found for this or any previous visit (from the past 240 hour(s)).   Procedures and Diagnostic Studies: Dg Chest 2 View  09/17/2012   *  RADIOLOGY REPORT*  Clinical Data:  Medical clearance, history squamous carcinoma, myasthenia gravis, hypertension  CHEST - 2 VIEW  Comparison: Chest CT 08/06/2012  Findings: Normal heart size, mediastinal contours, and pulmonary vascularity. Mildly tortuous thoracic aorta noted. Lungs hyperinflated but clear. No acute infiltrate, pleural effusion or pneumothorax. Bones appear mildly demineralized.  IMPRESSION: Mildly hyperinflated lungs without acute infiltrate.   Original Report Authenticated By: Ulyses Southward, M.D.    Scheduled Meds: . aspirin EC  81 mg  Oral q morning - 10a  . calcium-vitamin D  1 tablet Oral BID  . cholecalciferol  1,000 Units Oral Daily  . enoxaparin (LOVENOX) injection  40 mg Subcutaneous Q24H  . loratadine  10 mg Oral Daily  . multivitamin with minerals  1 tablet Oral Daily  . pantoprazole  40 mg Oral Daily  . predniSONE  50 mg Oral Q breakfast  . pyridostigmine  30 mg Oral Q8H  . risperiDONE  0.5 mg Oral BID   Continuous Infusions: . sodium chloride 75 mL/hr at 09/19/12 2043    Time spent: 35 minutes.   LOS: 1 day   Bree Heinzelman  [facility] Pager [phone].  If 8PM-8AM, please contact night-coverage at [email or web address], password [facility] 09/20/2012, 8:07 AM

## 2012-09-21 ENCOUNTER — Inpatient Hospital Stay (HOSPITAL_COMMUNITY): Payer: Medicare Other

## 2012-09-21 DIAGNOSIS — R062 Wheezing: Secondary | ICD-10-CM | POA: Diagnosis not present

## 2012-09-21 DIAGNOSIS — F309 Manic episode, unspecified: Secondary | ICD-10-CM | POA: Diagnosis not present

## 2012-09-21 DIAGNOSIS — R930 Abnormal findings on diagnostic imaging of skull and head, not elsewhere classified: Secondary | ICD-10-CM | POA: Diagnosis not present

## 2012-09-21 DIAGNOSIS — R000 Tachycardia, unspecified: Secondary | ICD-10-CM | POA: Diagnosis present

## 2012-09-21 LAB — BASIC METABOLIC PANEL
BUN: 27 mg/dL — ABNORMAL HIGH (ref 6–23)
Creatinine, Ser: 1.39 mg/dL — ABNORMAL HIGH (ref 0.50–1.35)
GFR calc Af Amer: 57 mL/min — ABNORMAL LOW (ref >90)
GFR calc non Af Amer: 49 mL/min — ABNORMAL LOW (ref >90)

## 2012-09-21 MED ORDER — RAMIPRIL 10 MG PO CAPS
10.0000 mg | ORAL_CAPSULE | Freq: Every day | ORAL | Status: DC
  Administered 2012-09-21 – 2012-09-29 (×9): 10 mg via ORAL
  Filled 2012-09-21 (×9): qty 1

## 2012-09-21 NOTE — Progress Notes (Signed)
I was asked to see Mr. Maneri s/p transfer from [facility] to [facility] room 4N-04. Mr. Calo is an 72 y.o. male with a past medical history of myasthenia gravis who was admitted to [facility] on 09/19/2012 with concerns for prednisone-induced psychosis. He is being followed by neurology for recommendations on how to best taper her prednisone. Psychiatry consultation has also been requested for assistance with management of his acute psychosis. Pt  transferred to [facility] neurology unit today for closer neurological monitoring. At bedside pt noted lying in bed in NAD. Wife and daughter are at bedside. Pt is alert. His speech is very pressured and tangential as he seems to stream unrelated topics together. Lots of statements about the [employer]. RN reports pt seems calm at times, manic at others. Current VSS. Cardiac telemetry reveals NSR. Will continue to monitor closely.   Leanne Chang. NP-C [facility] Pager [phone]

## 2012-09-21 NOTE — Progress Notes (Signed)
Pt refuses to do NIF and VC.  He states that he is "afraid that I will put something in it that will incapacitate him."  He did say that he would do the test when he goes to [facility] hospital later today.

## 2012-09-21 NOTE — Progress Notes (Signed)
Received from [facility] via [facility]; family arrived at bedside; patient oriented to the room and unit procedures; a Recruitment consultant will be present at 7pm per orders; vital signs stable; report given to on coming RN.

## 2012-09-21 NOTE — Progress Notes (Signed)
Report given to Seward Speck on [facility] at [facility]. Report given to [facility]. No change in pt condition since AM assessment. Pt to be transported via [facility] to [facility]. Eugene Garnet RN

## 2012-09-21 NOTE — Progress Notes (Signed)
Nutrition Brief Note  Patient identified on the Malnutrition Screening Tool (MST) Report  Body mass index is 22.49 kg/(m^2). Patient meets criteria for normal weight based on current BMI.   Current diet order is regular, patient is consuming approximately 100% of meals at this time per sister. Labs and medications reviewed. Pt admitted with psychosis. History obtained from pt and sister who was present at bedside. Met with pt who reports good appetite PTA. Pt reports 20 pound weight loss PTA which he thinks was intentional from trying not to gain too much weight being on steroids. Pt reports he was on meal replacement bars PTA which helped him get diagnosed with myesthenia gravis because he was having difficulty with facial muscles and jaw weakness while eating the bar. Pt denies any difficulty chewing currently. Sister reports pt ate 100% of a ham biscuit this morning.   No nutrition interventions warranted at this time. If nutrition issues arise, please consult RD.    Levon Hedger MS, RD, LDN [phone] Pager [phone] After Hours Pager

## 2012-09-21 NOTE — Progress Notes (Signed)
Clinical Social Work Department CLINICAL SOCIAL WORK PSYCHIATRY SERVICE LINE ASSESSMENT 09/21/2012  Patient:  Oscar Jacobs  Account:  [id number]  Admit Date:  09/19/2012  Clinical Social Worker:  Unk Lightning, LCSW  Date/Time:  09/21/2012 01:30 PM Referred by:  Physician  Date referred:  09/21/2012 Reason for Referral  Psychosocial assessment   Presenting Symptoms/Problems (In the person's/family's own words):   Psych consulted due to psychosis and agitation   Abuse/Neglect/Trauma History (check all that apply)  Denies history   Abuse/Neglect/Trauma Comments:   Psychiatric History (check all that apply)  Denies history   Psychiatric medications:  Risperdal 0.5 mg   Current Mental Health Hospitalizations/Previous Mental Health History:   Patient has no MH diagnosis but wife reports that bipolar "runs in the family."   Current provider:   None   Place and Date:   N/A   Current Medications:   acetaminophen, acetaminophen, alum & mag hydroxide-simeth, hydrALAZINE, HYDROcodone-acetaminophen, ondansetron (ZOFRAN) IV, ondansetron, polyethylene glycol            . aspirin EC  81 mg Oral q morning - 10a  . calcium-vitamin D  1 tablet Oral BID  . cholecalciferol  1,000 Units Oral Daily  . enoxaparin (LOVENOX) injection  40 mg Subcutaneous Q24H  . loratadine  10 mg Oral Daily  . multivitamin with minerals  1 tablet Oral Daily  . pantoprazole  40 mg Oral Daily  . pyridostigmine  30 mg Oral Q8H  . ramipril  10 mg Oral Daily  . risperiDONE  0.5 mg Oral BID   Previous Impatient Admission/Date/Reason:   None reported   Emotional Health / Current Symptoms    Suicide/Self Harm  None reported   Suicide attempt in the past:   Patient denies any SI or HI.   Other harmful behavior:   None reported   Psychotic/Dissociative Symptoms  Other - See comment   Other Psychotic/Dissociative Symptoms:   Patient speaks of "religious forces" that are ruling the world and changing the  rules.    Attention/Behavioral Symptoms  Inattentive   Other Attention / Behavioral Symptoms:   Patient restless throughout session with rapid, pressured speech.    Cognitive Impairment  Other - See comment   Other Cognitive Impairment:   Patient oriented to self and place but cannot recognize his wife of 30+ years in the room.    Mood and Adjustment  Anxious  Euphoric    Stress, Anxiety, Trauma, Any Recent Loss/Stressor  None reported   Anxiety (frequency):   N/A   Phobia (specify):   N/A   Compulsive behavior (specify):   N/A   Obsessive behavior (specify):   N/A   Other:   N/A   Substance Abuse/Use  None   SBIRT completed (please refer for detailed history):  N  Self-reported substance use:   Patient and wife deny that patient uses any substances.   Urinary Drug Screen Completed:  Y Alcohol level:   <11    Environmental/Housing/Living Arrangement  Stable housing   Who is in the home:   Wife   Emergency contact:  Oscar Jacobs-wife   Financial  Medicare   Patient's Strengths and Goals (patient's own words):   Patient has supportive wife.   Clinical Social Worker's Interpretive Summary:   CSW received referral to complete psychosocial assessment. CSW reviewed chart and met with patient and wife at bedside. CSW introduced myself and explained role.    Patient and wife have been married for 39 years and have 3 dtrs together.  Dtrs have moved away and two live in different states and one dtr lives in [place]. Patient reports he was in the [employer] and is a retired Development worker, community from the area. Wife reports that patient came to the hospital after becoming manic and "being too hyper." Wife and patient report that patient's behavior is reflective of side effects from medication.    Wife is able to provide some information but patient interrupts her several times. Patient has pressured, rapid speech and gives tangential information throughout the assessment.  Patient has grandiose ideas and struggles to remain focused. Patient is fixated on [employer] and contradicts himself throughout stories. It is unclear if patient served in [place] or if he was stationed in [place] for the war. Patient makes statements several times about being a POW but also reports he never had to go to [place].    Wife confirms no previous personal history of MH but reports significant family history for bipolar. Patient reports he is feeling more oriented and less manic today but wife reports that patient remains manic.    CSW will staff case with psych MD and follow any recommendations provided.   Disposition:  Recommend Psych CSW continuing to support while in hospital

## 2012-09-21 NOTE — Progress Notes (Signed)
NEURO HOSPITALIST PROGRESS NOTE   SUBJECTIVE:                                                                                                                         Family members in room, state he had a hard time sleeping last night.  They are concerned about room and placement as the sound kept patient awake. They are considering transferring patient to either [facility] or [facility]. They state patient had one episode where he needed to hold his jaw up due to weakness--the last time he needed to do so was about 3 weeks ago.  No other complaints of difficulty swallowing, diplopia, slurred speech or weakness.    Of note--patient is concerned he may have metal shavings in his eyes which would not be comparable with MRI.  OBJECTIVE:                                                                                                                           Vital signs in last 24 hours: Temp:  [97.6 F (36.4 C)-97.8 F (36.6 C)] 97.6 F (36.4 C) (05/26 0500) Pulse Rate:  [103-120] 109 (05/26 0500) Resp:  [16-20] 20 (05/26 0500) BP: (129-159)/(66-83) 159/83 mmHg (05/26 0500) SpO2:  [96 %-97 %] 97 % (05/26 0500) Weight:  [79.5 kg (175 lb 4.3 oz)] 79.5 kg (175 lb 4.3 oz) (05/25 2100)  Intake/Output from previous day: 05/25 0701 - 05/26 0700 In: 240 [P.O.:240] Out: 1 [Urine:1] Intake/Output this shift:   Nutritional status: General  Past Medical History  Diagnosis Date  . Sleep apnea   . Hypertension   . Squamous carcinoma   . Myasthenia gravis   . GERD (gastroesophageal reflux disease)    ROS: Muscle/skeletal--normal Neurological--ptosis   Neurologic Exam:  Mental Status: Alert, oriented, pressured thoughts with tangential thinking. Marland Kitchen  Speech fluent, pressured without evidence of aphasia.  Able to follow 3 step commands without difficulty. Cranial Nerves: ZO:XWRUEA fields grossly normal, pupils equal, round, reactive to light and accommodation III,IV,  VI: ptosis present bilaterally, extra-ocular motions intact bilaterally V,VII: smile symmetric, facial light touch sensation normal bilaterally VIII: hearing normal bilaterally IX,X: gag reflex present XI: bilateral shoulder shrug XII: midline tongue extension Motor: Right : Upper  extremity   5/5    Left:     Upper extremity   5/5  Lower extremity   5/5     Lower extremity   5/5 Tone and bulk:normal tone throughout; no atrophy noted Sensory: Pinprick and light touch intact throughout, bilaterally Deep Tendon Reflexes: 2+ and symmetric throughout Plantars: Right: downgoing   Left: downgoing Cerebellar: normal finger-to-nose,  normal heel-to-shin test CV: pulses palpable throughout    Lab Results: No results found for this basename: cbc, bmp, coags, chol, tri, ldl, hga1c   Lipid Panel No results found for this basename: CHOL, TRIG, HDL, CHOLHDL, VLDL, LDLCALC,  in the last 72 hours  Studies/Results: No results found.  MEDICATIONS                                                                                                                        Scheduled: . aspirin EC  81 mg Oral q morning - 10a  . calcium-vitamin D  1 tablet Oral BID  . cholecalciferol  1,000 Units Oral Daily  . enoxaparin (LOVENOX) injection  40 mg Subcutaneous Q24H  . loratadine  10 mg Oral Daily  . multivitamin with minerals  1 tablet Oral Daily  . pantoprazole  40 mg Oral Daily  . pyridostigmine  30 mg Oral Q8H  . risperiDONE  0.5 mg Oral BID    ASSESSMENT/PLAN:                                                                                                            72 y/o with subacute psychosis, likely steroid induced. Patient has known Myasthenia and started Prednisone in March.    Recommend:  1) Continue tapering down prednisone and closely monitor for evidence of MG worsening: will give 40 mg today and 5/27 then decrease to 30 mg for two days then 20 mg for two days.  2) CT head ---Patient is  concerned he has metal shavings in his eyes that were not picked up by CT. Will obtain CT head to evaluate intracranial pathology and also evaluate for metal in orbits.  If no metal will go forward for MRI.  3) EEG -pending 4) Will follow up.    One hour was spent with family discussing Dx, Tx, pathology and recent events leading to hospitalization.   Assessment and plan discussed with with attending physician and they are in agreement.    Felicie Morn PA-C [facility] [phone]  09/21/2012, 8:52 AM   Patient seen and examined together with physician assistant and I  concur with the assessment and plan.  Wyatt Portela, MD

## 2012-09-21 NOTE — Progress Notes (Signed)
[facility] PROGRESS NOTE  Oscar Jacobs YQM:[id number] DOB: 1940/05/10 DOA: 09/19/2012 PCP: Oscar Kayser, MD  Brief narrative: Oscar Jacobs is an 72 y.o. male with a past medical history of myasthenia gravis who was admitted on 09/19/2012 with concerns for prednisone-induced psychosis. He is being followed by neurology for recommendations on how to best taper her prednisone. Psychiatry consultation has also been requested for assistance with management of his acute psychosis. On 09/21/2012, the patient was transferred to [facility] neurology unit for closer neurological monitoring.  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 IVIG may be needed to control myasthenia symptoms if recur with steroid taper. -Transfer to [facility], tele bed, per patient/family request to go to neurology floor for closer monitoring. -Follow up MRI/EEG. -Continue Risperdal. Psychiatry consultation performed 09/20/2012 to assist with management of psychosis.  -Patent attorney, if available.  -Neurochecks every 4 hours requested per nursing staff. Active Problems:  Tachycardia -Likely from psychomotor agitation. Monitor on telemetry. Wheeze -No h/o asthma or COPD.  Pro BNP not elevated. Improved with discontinuation of IV fluids. GERD  -Continue PPI therapy.  Leukocytosis  -Non-toxic appearing, suspect de-margination effect of steroids. Monitor. Steroid-induced hyperglycemia  -Mild. Monitor fasting CBGs Q a.m.  AKI (acute kidney injury)  -Likely from pre-renal causes.  -Okay to resume ACE inhibitor. HTN (hypertension)  -Resume ACE. -Hydralazine 10 mg IV PRN elevated BP.   Code Status: Full.  Family Communication: Wife, daughter, sister at bedside.  Disposition Plan: Home when stable.   Medical Consultants:  Dr. Wyatt Jacobs, Neurology.  Dr. Tomasita Jacobs, Psychiatry.  Other  Consultants:  None.  Anti-infectives:  None.  HPI/Subjective: Oscar Jacobs is having quite a bit of psychomotor agitation. He has pressured speech. No frank hallucinations but behavior definitely is bizarre to the situation. He did not sleep well last night. He was having some groin pain earlier, but he says this has resolved.  Objective: Filed Vitals:   09/20/12 0500 09/20/12 1405 09/20/12 2100 09/21/12 0500  BP:  132/66 129/78 159/83  Pulse:  120 103 109  Temp:  97.6 F (36.4 C) 97.8 F (36.6 C) 97.6 F (36.4 C)  TempSrc:  Oral Oral Oral  Resp:  16 18 20   Height: 6\' 2"  (1.88 m)     Weight: 85.276 kg (188 lb)  79.5 kg (175 lb 4.3 oz)   SpO2:  97% 96% 97%    Intake/Output Summary (Last 24 hours) at 09/21/12 0818 Last data filed at 09/20/12 2254  Gross per 24 hour  Intake    240 ml  Output      1 ml  Net    239 ml    Exam: Gen/psych: Increased psychomotor agitation with pressured speech Cardiovascular:  RRR, No M/R/G Respiratory:  Lungs diminished with some high-pitched expiratory wheezes, new from yesterday. Gastrointestinal:  Abdomen soft, NT/ND, + BS Extremities:  No C/E/C Neuro: Alert and oriented to place, self, and date.  Nonfocal. Gait steady.  Data Reviewed: Basic Metabolic Panel:  Recent Labs Lab 09/17/12 1530 09/19/12 1110 09/20/12 0453 09/21/12 0421  NA 138 137 138 138  K 4.2 4.1 3.5 3.6  CL 103 99 102 102  CO2 25 25 28 25   GLUCOSE 137* 118* 82 91  BUN 25* 36* 27* 27*  CREATININE 1.32 1.58* 1.41* 1.39*  CALCIUM 9.2 9.6 8.7 8.8   GFR Estimated Creatinine Clearance: 54 ml/min (by C-G formula based on Cr of 1.39).  Liver Function Tests:  Recent Labs Lab 09/17/12 1530  AST 22  ALT 23  ALKPHOS 54  BILITOT 0.5  PROT 6.5  ALBUMIN 3.5   CBC:  Recent Labs Lab 09/17/12 1530 09/19/12 1110  WBC 11.1* 14.7*  NEUTROABS  --  13.5*  HGB 14.9 15.8  HCT 43.3 45.2  MCV 88.2 88.1  PLT 151 159   Microbiology No results found for this or any  previous visit (from the past 240 hour(s)).   Procedures and Diagnostic Studies: Dg Chest 2 View  09/17/2012   *RADIOLOGY REPORT*  Clinical Data:  Medical clearance, history squamous carcinoma, myasthenia gravis, hypertension  CHEST - 2 VIEW  Comparison: Chest CT 08/06/2012  Findings: Normal heart size, mediastinal contours, and pulmonary vascularity. Mildly tortuous thoracic aorta noted. Lungs hyperinflated but clear. No acute infiltrate, pleural effusion or pneumothorax. Bones appear mildly demineralized.  IMPRESSION: Mildly hyperinflated lungs without acute infiltrate.   Original Report Authenticated By: Oscar Jacobs, M.D.    Scheduled Meds: . aspirin EC  81 mg Oral q morning - 10a  . calcium-vitamin D  1 tablet Oral BID  . cholecalciferol  1,000 Units Oral Daily  . enoxaparin (LOVENOX) injection  40 mg Subcutaneous Q24H  . loratadine  10 mg Oral Daily  . multivitamin with minerals  1 tablet Oral Daily  . pantoprazole  40 mg Oral Daily  . pyridostigmine  30 mg Oral Q8H  . risperiDONE  0.5 mg Oral BID   Continuous Infusions:    Time spent: 60 minutes with more than 50% of the time spent counseling the family and the patient about the disease process, current management, and plan of care.   LOS: 2 days   Oscar Jacobs  [facility] Pager [phone].  If 8PM-8AM, please contact night-coverage at [email or web address], password [facility] 09/21/2012, 8:18 AM

## 2012-09-22 ENCOUNTER — Inpatient Hospital Stay (HOSPITAL_COMMUNITY): Payer: Medicare Other

## 2012-09-22 DIAGNOSIS — D72829 Elevated white blood cell count, unspecified: Secondary | ICD-10-CM

## 2012-09-22 DIAGNOSIS — G7000 Myasthenia gravis without (acute) exacerbation: Secondary | ICD-10-CM

## 2012-09-22 DIAGNOSIS — N179 Acute kidney failure, unspecified: Secondary | ICD-10-CM

## 2012-09-22 DIAGNOSIS — I10 Essential (primary) hypertension: Secondary | ICD-10-CM

## 2012-09-22 DIAGNOSIS — R4182 Altered mental status, unspecified: Secondary | ICD-10-CM | POA: Diagnosis not present

## 2012-09-22 DIAGNOSIS — R7309 Other abnormal glucose: Secondary | ICD-10-CM

## 2012-09-22 DIAGNOSIS — F19988 Other psychoactive substance use, unspecified with other psychoactive substance-induced disorder: Secondary | ICD-10-CM

## 2012-09-22 DIAGNOSIS — F309 Manic episode, unspecified: Secondary | ICD-10-CM | POA: Diagnosis not present

## 2012-09-22 LAB — GLUCOSE, CAPILLARY

## 2012-09-22 MED ORDER — PREDNISONE 20 MG PO TABS
40.0000 mg | ORAL_TABLET | ORAL | Status: AC
  Administered 2012-09-22: 40 mg via ORAL
  Filled 2012-09-22: qty 2

## 2012-09-22 MED ORDER — PREDNISONE 20 MG PO TABS
30.0000 mg | ORAL_TABLET | Freq: Every day | ORAL | Status: AC
  Administered 2012-09-23 – 2012-09-24 (×2): 30 mg via ORAL
  Filled 2012-09-22 (×2): qty 1

## 2012-09-22 MED ORDER — PREDNISONE 10 MG PO TABS
10.0000 mg | ORAL_TABLET | Freq: Every day | ORAL | Status: DC
  Administered 2012-09-27: 10 mg via ORAL
  Filled 2012-09-22 (×2): qty 1

## 2012-09-22 MED ORDER — PREDNISONE 20 MG PO TABS
20.0000 mg | ORAL_TABLET | Freq: Every day | ORAL | Status: AC
  Administered 2012-09-25 – 2012-09-26 (×2): 20 mg via ORAL
  Filled 2012-09-22 (×2): qty 1

## 2012-09-22 NOTE — Progress Notes (Signed)
[facility] PROGRESS NOTE  Oscar Jacobs ZOX:[id number] DOB: 05/02/1940 DOA: 09/19/2012 PCP: Ezequiel Kayser, MD  Brief narrative: Oscar Jacobs is an 72 y.o. male with a past medical history of myasthenia gravis who was admitted on 09/19/2012 with concerns for prednisone-induced psychosis. He is being followed by neurology for recommendations on how to best taper her prednisone. Psychiatry consultation has also been requested for assistance with management of his acute psychosis. On 09/21/2012, the patient was transferred to [facility] neurology unit for closer neurological monitoring.  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 IVIG may be needed to control myasthenia symptoms if recur with steroid taper. -Follow EEG. -MRI negative -Continue Risperdal. Psychiatry consultation performed 09/20/2012 to assist with management of psychosis.  -Patent attorney, if available.  -Neurochecks every 4 hours requested per nursing staff.  Tachycardia -Likely from psychomotor agitation. Monitor on telemetry.  Wheeze -No h/o asthma or COPD.  Pro BNP not elevated. Improved with discontinuation of IV fluids.  GERD  -Continue PPI therapy.   Leukocytosis  -Non-toxic appearing, suspect de-margination effect of steroids. Monitor.  Steroid-induced hyperglycemia  -Mild. Monitor fasting CBGs Q a.m.   AKI (acute kidney injury)  -Likely from pre-renal causes.  -Okay to resume ACE inhibitor.  HTN (hypertension)  -Resume ACE. -Hydralazine 10 mg IV PRN elevated BP.   Code Status: Full.  Family Communication: Wife, daughter, at bedside. Disposition Plan: Home when stable.   Medical Consultants:  Dr. Wyatt Portela, Neurology.  Dr. Tye Maryland, Psychiatry.  Other Consultants:  None.  Anti-infectives:  None.  HPI/Subjective: Oscar Jacobs is having quite a bit of psychomotor agitation. He  has pressured speech. No frank hallucinations but behavior definitely is bizarre to the situation. He did not sleep well last night..  Objective: Filed Vitals:   09/22/12 0157 09/22/12 0653 09/22/12 1034 09/22/12 1430  BP: 137/60 153/79 139/64 149/67  Pulse: 71 89 78 104  Temp: 97.9 F (36.6 C) 97.6 F (36.4 C) 97.6 F (36.4 C) 97.6 F (36.4 C)  TempSrc: Oral Oral Oral Oral  Resp: 18 20 18 18   Height:      Weight:      SpO2: 97% 98% 100% 99%    Intake/Output Summary (Last 24 hours) at 09/22/12 1649 Last data filed at 09/21/12 2300  Gross per 24 hour  Intake    240 ml  Output      0 ml  Net    240 ml    Exam: Gen/psych: Increased psychomotor agitation with pressured speech Cardiovascular:  RRR, No M/R/G Respiratory:  Lungs diminished with some high-pitched expiratory wheezes, new from yesterday. Gastrointestinal:  Abdomen soft, NT/ND, + BS Extremities:  No C/E/C Neuro: Alert and oriented to place, self, and date.  Nonfocal. Gait steady.  Data Reviewed: Basic Metabolic Panel:  Recent Labs Lab 09/17/12 1530 09/19/12 1110 09/20/12 0453 09/21/12 0421  NA 138 137 138 138  K 4.2 4.1 3.5 3.6  CL 103 99 102 102  CO2 25 25 28 25   GLUCOSE 137* 118* 82 91  BUN 25* 36* 27* 27*  CREATININE 1.32 1.58* 1.41* 1.39*  CALCIUM 9.2 9.6 8.7 8.8   GFR Estimated Creatinine Clearance: 54 ml/min (by C-G formula based on Cr of 1.39). Liver Function Tests:  Recent Labs Lab 09/17/12 1530  AST 22  ALT 23  ALKPHOS 54  BILITOT 0.5  PROT 6.5  ALBUMIN 3.5   CBC:  Recent Labs Lab 09/17/12 1530 09/19/12  1110  WBC 11.1* 14.7*  NEUTROABS  --  13.5*  HGB 14.9 15.8  HCT 43.3 45.2  MCV 88.2 88.1  PLT 151 159   Microbiology No results found for this or any previous visit (from the past 240 hour(s)).   Procedures and Diagnostic Studies: Dg Chest 2 View  09/17/2012   *RADIOLOGY REPORT*  Clinical Data:  Medical clearance, history squamous carcinoma, myasthenia gravis,  hypertension  CHEST - 2 VIEW  Comparison: Chest CT 08/06/2012  Findings: Normal heart size, mediastinal contours, and pulmonary vascularity. Mildly tortuous thoracic aorta noted. Lungs hyperinflated but clear. No acute infiltrate, pleural effusion or pneumothorax. Bones appear mildly demineralized.  IMPRESSION: Mildly hyperinflated lungs without acute infiltrate.   Original Report Authenticated By: Ulyses Southward, M.D.    Scheduled Meds: . aspirin EC  81 mg Oral q morning - 10a  . calcium-vitamin D  1 tablet Oral BID  . cholecalciferol  1,000 Units Oral Daily  . enoxaparin (LOVENOX) injection  40 mg Subcutaneous Q24H  . loratadine  10 mg Oral Daily  . multivitamin with minerals  1 tablet Oral Daily  . pantoprazole  40 mg Oral Daily  . [START ON 09/27/2012] predniSONE  10 mg Oral Q breakfast  . [START ON 09/25/2012] predniSONE  20 mg Oral Q breakfast  . [START ON 09/23/2012] predniSONE  30 mg Oral QAC breakfast  . pyridostigmine  30 mg Oral Q8H  . ramipril  10 mg Oral Daily  . risperiDONE  0.5 mg Oral BID   Continuous Infusions:    Time spent: 45 minutes.   LOS: 3 days   [facility]  [facility] Pager [phone].  If 8PM-8AM, please contact night-coverage at [email or web address], password [facility] 09/22/2012, 4:49 PM

## 2012-09-22 NOTE — Procedures (Addendum)
EEG report.  Brief clinical history:  72 years old male without known risk factors for epilepsy, admitted with acute psychosis.  Technique: this is a 17 channel routine scalp EEG performed at the bedside with bipolar and monopolar montages arranged in accordance to the international 10/20 system of electrode placement. One channel was dedicated to EKG recording.  The study was performed during wakefulness, drowsiness, and stage 2 sleep. No activating procedures utilized during the test.  Description:In the wakeful state, the best background consisted of low amplitude, posterior dominant, well sustained, symmetric and reactive 11 Hz rhythm Drowsiness demonstrated dropout of the alpha rhythm. Stage 2 sleep showed symmetric and synchronous sleep spindles without intermixed epileptiform discharges. No focal or generalized epileptiform discharges noted.  No pathologic areas of slowing seen.  EKG showed sinus rhythm.  Impression: this is a normal awake and asleep EEG. Please, be aware that a normal EEG does not exclude the possibility of epilepsy.  Clinical correlation is advised.  Wyatt Portela, MD

## 2012-09-22 NOTE — Progress Notes (Signed)
EEG completed.

## 2012-09-22 NOTE — Progress Notes (Addendum)
NEURO HOSPITALIST PROGRESS NOTE   SUBJECTIVE:                                                                                                                         Patient remains to have pressured speech, non tangential thinking, some paranoia.  OBJECTIVE:                                                                                                                           Vital signs in last 24 hours: Temp:  [97.6 F (36.4 C)-98.6 F (37 C)] 97.6 F (36.4 C) (05/27 1034) Pulse Rate:  [71-113] 78 (05/27 1034) Resp:  [16-20] 18 (05/27 1034) BP: (137-170)/(60-90) 139/64 mmHg (05/27 1034) SpO2:  [96 %-100 %] 100 % (05/27 1034)  Intake/Output from previous day: 05/26 0701 - 05/27 0700 In: 720 [P.O.:720] Out: -  Intake/Output this shift:   Nutritional status: General  Past Medical History  Diagnosis Date  . Sleep apnea   . Hypertension   . Squamous carcinoma   . Myasthenia gravis   . GERD (gastroesophageal reflux disease)      Neurologic Exam:  Mental Status: Alert, oriented, speech pressured, tangential and thoughts are not coherent.   Speech fluent without evidence of aphasia.  Able to follow 3 step commands without difficulty. Cranial Nerves: II: Discs flat bilaterally; Visual fields grossly normal, pupils equal, round, reactive to light and accommodation III,IV, VI: ptosis not present, extra-ocular motions intact bilaterally V,VII: smile symmetric, facial light touch sensation normal bilaterally VIII: hearing normal bilaterally IX,X: gag reflex present XI: bilateral shoulder shrug XII: midline tongue extension Motor: Right : Upper extremity   5/5    Left:     Upper extremity   5/5  Lower extremity   5/5     Lower extremity   5/5 Tone and bulk:normal tone throughout; no atrophy noted Sensory: Pinprick and light touch intact throughout, bilaterally Deep Tendon Reflexes: 2+ and symmetric throughout Plantars: Right:  downgoing   Left: downgoing Cerebellar: normal finger-to-nose,  normal heel-to-shin test CV: pulses palpable throughout    Lab Results: No results found for this basename: cbc, bmp, coags, chol, tri, ldl, hga1c   Lipid Panel No results found for this basename: CHOL,  TRIG, HDL, CHOLHDL, VLDL, LDLCALC,  in the last 72 hours  Studies/Results: Ct Head Wo Contrast  09/21/2012   *RADIOLOGY REPORT*  Clinical Data: Worsening confusion  CT HEAD WITHOUT CONTRAST  Technique:  Contiguous axial images were obtained from the base of the skull through the vertex without contrast.  Comparison: None.  Findings: The brain has a normal appearance without evidence of accelerated atrophy, old or acute infarction, mass lesion, hemorrhage, hydrocephalus or extra-axial collection.  No calvarial abnormality.  There is a small amount of fluid in the ethmoid and maxillary sinuses, probably subclinical.  IMPRESSION: Normal head CT for a person of this age.  No cause of confusion identified.   Original Report Authenticated By: Paulina Fusi, M.D.   Mr Brain Wo Contrast  09/22/2012   *RADIOLOGY REPORT*  Clinical Data: Altered mental status.  Myasthenia gravis  MRI HEAD WITHOUT CONTRAST  Technique:  Multiplanar, multiecho pulse sequences of the brain and surrounding structures were obtained according to standard protocol without intravenous contrast.  Comparison: CT 09/21/2012  Findings: Generalized atrophy, typical for age.  Negative for acute infarct.  No significant chronic ischemia.  Negative for intracranial hemorrhage.  No fluid collection or mass effect.  Negative for mass or edema.  No shift to the midline structures.  Vessels at the base the brain are patent.  Mucosal edema in the maxillary and ethmoid sinuses.  Air-fluid level left maxillary sinus.  IMPRESSION: No acute intracranial abnormality.  Sinusitis.   Original Report Authenticated By: Janeece Riggers, M.D.    MEDICATIONS                                                                                                                         Scheduled: . aspirin EC  81 mg Oral q morning - 10a  . calcium-vitamin D  1 tablet Oral BID  . cholecalciferol  1,000 Units Oral Daily  . enoxaparin (LOVENOX) injection  40 mg Subcutaneous Q24H  . loratadine  10 mg Oral Daily  . multivitamin with minerals  1 tablet Oral Daily  . pantoprazole  40 mg Oral Daily  . [START ON 09/27/2012] predniSONE  10 mg Oral Q breakfast  . [START ON 09/25/2012] predniSONE  20 mg Oral Q breakfast  . [START ON 09/23/2012] predniSONE  30 mg Oral QAC breakfast  . predniSONE  40 mg Oral NOW  . pyridostigmine  30 mg Oral Q8H  . ramipril  10 mg Oral Daily  . risperiDONE  0.5 mg Oral BID    ASSESSMENT/PLAN:  72 y/o with subacute psychosis, likely steroid induced. Patient has known Myasthenia and started Prednisone in March.  Recommend:  1) Continue tapering down prednisone and closely monitor for evidence of MG worsening: will give 40 mg today then decrease to 30 mg for two days then 20 mg for two days.  2) EEG -pending  3) Will follow up.   Discussed with Dr. Ardyth Harps   Assessment and plan discussed with with attending physician and they are in agreement.    Felicie Morn PA-C [facility] [phone]  09/22/2012, 10:54 AM   MRI brain reviewed and it is essentially unremarkable. EEG normal.  Wyatt Portela, MD

## 2012-09-23 DIAGNOSIS — G7000 Myasthenia gravis without (acute) exacerbation: Secondary | ICD-10-CM | POA: Diagnosis not present

## 2012-09-23 DIAGNOSIS — I10 Essential (primary) hypertension: Secondary | ICD-10-CM | POA: Diagnosis not present

## 2012-09-23 DIAGNOSIS — F309 Manic episode, unspecified: Secondary | ICD-10-CM | POA: Diagnosis not present

## 2012-09-23 MED ORDER — DIPHENHYDRAMINE HCL 50 MG/ML IJ SOLN
12.5000 mg | Freq: Once | INTRAMUSCULAR | Status: AC
  Administered 2012-09-23: 12.5 mg via INTRAVENOUS
  Filled 2012-09-23: qty 1

## 2012-09-23 MED ORDER — IMMUNE GLOBULIN (HUMAN) 5 GM/100ML IV SOLN
400.0000 mg/kg | INTRAVENOUS | Status: AC
  Administered 2012-09-23 – 2012-09-27 (×5): 35 g via INTRAVENOUS
  Filled 2012-09-23 (×6): qty 100

## 2012-09-23 NOTE — Progress Notes (Signed)
Family and patient refusing bed alarm and Recruitment consultant. Patient is steady on feet, and family is continuously at bedside monitoring the patient. Family notified to let RN/NT know if they need help with patient. Patient very insistent that staff leave him sleep tonight. Stated "people tell me to sleep, people tell me to stay awake, tell me to walk, but put that alarm on." RN/NT spoke with patient regarding the evening's plan and that we were required to assess neuro status and VS every 4 hours, and that we would enter room together at specific times so as not to wake him too much. Patient and family agreeable to plan. Will continue to monitor.

## 2012-09-23 NOTE — Progress Notes (Addendum)
Subjective: No distress overnight, wife feel sthat his psychosis is improving. Slept better.  States that he is under "deep cover"  Exam: Filed Vitals:   09/23/12 0624  BP: 169/85  Pulse: 71  Temp: 97.3 F (36.3 C)  Resp: 20   Gen: In bed, NAD MS: Awake, Alert. Mildy pressured speech. Oriented to person, place and year.  ZO:XWRUE, EOMI without diplopluia or ptosis.  Motor: 5/5 neck strength, no drift in arms. No fatigable weakness.  Sensory:intact to LT.   Impression: 72 yo M with MG and steroid induced mania. Currently tapering steroids without signs of MG. He would be at risk for worsening as steroids are reduced and if recurrence were to happen, then would proceed with IVIG.    Recommendations: 1) Continue tapering steroids.  2) If any symptoms, would increase mestion, currently only on 30mg  Q8H.  3) Also could consider IVIG if needed.   Ritta Slot, MD [facility] [phone]  If 7pm- 7am, please page neurology on call at [phone].    AddendumSherron Monday with outpatient neurologist and wife, patient had significant weakness including neck weakness prior to starting steroids, and I am concerned that stopping his immunosuppression would leave him at risk for a serious flare. In this setting, I would favor giving a dose of IVIG (2gm/kg total given over 5 days) as we stop his steroids  Will also discuss starting other disease modifying therapy such as imuran, though the duration to effect can be on the order of months.    Ritta Slot, MD [facility] [phone]  If 7pm- 7am, please page neurology on call at [phone].

## 2012-09-23 NOTE — Progress Notes (Signed)
[facility] PROGRESS NOTE  Oscar Jacobs QMV:[id number] DOB: Mar 05, 1941 DOA: 09/19/2012 PCP: Ezequiel Kayser, MD  Assessment/Plan: Psychosis due to steroid use / myesthenia gravis  -Slow taper of steroids, with neurological consultation/oversight. IVIG may be needed to control myasthenia symptoms if recur with steroid taper. Currently on 30 mg prednisone. -EEG normal -MRI negative  -Continue Risperdal. Psychiatry consultation performed 09/20/2012 to assist with management of psychosis.  -Patent attorney, if available.  -Neurochecks every 4 hours requested per nursing staff.  Tachycardia  -Likely from psychomotor agitation. Monitor on telemetry.  Wheeze  -No h/o asthma or COPD. Pro BNP not elevated. Improved with discontinuation of IV fluids.  GERD  -Continue PPI therapy.  Leukocytosis  -Non-toxic appearing, suspect de-margination effect of steroids. Monitor. Afebrile Steroid-induced hyperglycemia  -Mild. Monitor fasting CBGs Q a.m.  AKI (acute kidney injury)  -Likely from pre-renal causes.  -Okay to resume ACE inhibitor.  -Slowly improving HTN (hypertension)  -Resume ACE.  -Hydralazine 10 mg IV PRN elevated BP.  Code Status: Full.  Family Communication: Wife, sister, at bedside.  Disposition Plan: Home when stable.    Consultants: Dr. Wyatt Portela, Neurology. Dr. Tye Maryland, Psychiatry.   HPI/Subjective: Patient is without complaints this morning  Objective: Filed Vitals:   09/22/12 1430 09/22/12 2213 09/23/12 0248 09/23/12 0624  BP: 149/67 139/69 158/86 169/85  Pulse: 104 69 85 71  Temp: 97.6 F (36.4 C) 97.7 F (36.5 C) 98.4 F (36.9 C) 97.3 F (36.3 C)  TempSrc: Oral Oral Oral Oral  Resp: 18 16 16 20   Height:      Weight:    81.285 kg (179 lb 3.2 oz)  SpO2: 99% 99% 100% 100%   No intake or output data in the 24 hours ending 09/23/12 0839 Filed Weights   09/20/12 0500 09/20/12 2100 09/23/12 0624  Weight: 85.276 kg (188 lb) 79.5 kg (175 lb 4.3  oz) 81.285 kg (179 lb 3.2 oz)    Exam:   General:  Patient awake and apparent distress  Cardiovascular: Regular S1-S2  Respiratory: Normal respiratory effort, crackles no wheeze  Abdomen: Soft, positive bowel sounds  Musculoskeletal: Perfused, no clubbing or cyanosis   Data Reviewed: Basic Metabolic Panel:  Recent Labs Lab 09/17/12 1530 09/19/12 1110 09/20/12 0453 09/21/12 0421  NA 138 137 138 138  K 4.2 4.1 3.5 3.6  CL 103 99 102 102  CO2 25 25 28 25   GLUCOSE 137* 118* 82 91  BUN 25* 36* 27* 27*  CREATININE 1.32 1.58* 1.41* 1.39*  CALCIUM 9.2 9.6 8.7 8.8   Liver Function Tests:  Recent Labs Lab 09/17/12 1530  AST 22  ALT 23  ALKPHOS 54  BILITOT 0.5  PROT 6.5  ALBUMIN 3.5   No results found for this basename: LIPASE, AMYLASE,  in the last 168 hours No results found for this basename: AMMONIA,  in the last 168 hours CBC:  Recent Labs Lab 09/17/12 1530 09/19/12 1110  WBC 11.1* 14.7*  NEUTROABS  --  13.5*  HGB 14.9 15.8  HCT 43.3 45.2  MCV 88.2 88.1  PLT 151 159   Cardiac Enzymes: No results found for this basename: CKTOTAL, CKMB, CKMBINDEX, TROPONINI,  in the last 168 hours BNP (last 3 results)  Recent Labs  09/20/12 0453  PROBNP 117.2   CBG:  Recent Labs Lab 09/22/12 0915 09/22/12 1138 09/23/12 0754  GLUCAP 123* 94 131*    No results found for this or any previous visit (from the past 240 hour(s)).   Studies: Ct  Head Wo Contrast  09/21/2012   *RADIOLOGY REPORT*  Clinical Data: Worsening confusion  CT HEAD WITHOUT CONTRAST  Technique:  Contiguous axial images were obtained from the base of the skull through the vertex without contrast.  Comparison: None.  Findings: The brain has a normal appearance without evidence of accelerated atrophy, old or acute infarction, mass lesion, hemorrhage, hydrocephalus or extra-axial collection.  No calvarial abnormality.  There is a small amount of fluid in the ethmoid and maxillary sinuses, probably  subclinical.  IMPRESSION: Normal head CT for a person of this age.  No cause of confusion identified.   Original Report Authenticated By: Paulina Fusi, M.D.   Mr Brain Wo Contrast  09/22/2012   *RADIOLOGY REPORT*  Clinical Data: Altered mental status.  Myasthenia gravis  MRI HEAD WITHOUT CONTRAST  Technique:  Multiplanar, multiecho pulse sequences of the brain and surrounding structures were obtained according to standard protocol without intravenous contrast.  Comparison: CT 09/21/2012  Findings: Generalized atrophy, typical for age.  Negative for acute infarct.  No significant chronic ischemia.  Negative for intracranial hemorrhage.  No fluid collection or mass effect.  Negative for mass or edema.  No shift to the midline structures.  Vessels at the base the brain are patent.  Mucosal edema in the maxillary and ethmoid sinuses.  Air-fluid level left maxillary sinus.  IMPRESSION: No acute intracranial abnormality.  Sinusitis.   Original Report Authenticated By: Janeece Riggers, M.D.    Scheduled Meds: . aspirin EC  81 mg Oral q morning - 10a  . calcium-vitamin D  1 tablet Oral BID  . cholecalciferol  1,000 Units Oral Daily  . enoxaparin (LOVENOX) injection  40 mg Subcutaneous Q24H  . loratadine  10 mg Oral Daily  . multivitamin with minerals  1 tablet Oral Daily  . pantoprazole  40 mg Oral Daily  . [START ON 09/27/2012] predniSONE  10 mg Oral Q breakfast  . [START ON 09/25/2012] predniSONE  20 mg Oral Q breakfast  . predniSONE  30 mg Oral QAC breakfast  . pyridostigmine  30 mg Oral Q8H  . ramipril  10 mg Oral Daily  . risperiDONE  0.5 mg Oral BID   Continuous Infusions:   Principal Problem:   Psychosis due to steroid use Active Problems:   Myasthenia gravis   Leukocytosis   Steroid-induced hyperglycemia   AKI (acute kidney injury)   HTN (hypertension)   Tachycardia   Wheeze    Time spent: 25 minute    Jenniferlynn Saad K  [facility] Pager [phone]. If 7PM-7AM, please contact  night-coverage at [email or web address], password [facility] 09/23/2012, 8:39 AM  LOS: 4 days

## 2012-09-24 DIAGNOSIS — R7309 Other abnormal glucose: Secondary | ICD-10-CM

## 2012-09-24 DIAGNOSIS — F309 Manic episode, unspecified: Secondary | ICD-10-CM | POA: Diagnosis not present

## 2012-09-24 DIAGNOSIS — D72829 Elevated white blood cell count, unspecified: Secondary | ICD-10-CM | POA: Diagnosis not present

## 2012-09-24 DIAGNOSIS — N179 Acute kidney failure, unspecified: Secondary | ICD-10-CM | POA: Diagnosis not present

## 2012-09-24 DIAGNOSIS — G7000 Myasthenia gravis without (acute) exacerbation: Secondary | ICD-10-CM | POA: Diagnosis not present

## 2012-09-24 DIAGNOSIS — I10 Essential (primary) hypertension: Secondary | ICD-10-CM

## 2012-09-24 LAB — GLUCOSE, CAPILLARY: Glucose-Capillary: 148 mg/dL — ABNORMAL HIGH (ref 70–99)

## 2012-09-24 LAB — CREATININE, SERUM: GFR calc non Af Amer: 57 mL/min — ABNORMAL LOW (ref >90)

## 2012-09-24 MED ORDER — AZATHIOPRINE 50 MG PO TABS
50.0000 mg | ORAL_TABLET | Freq: Every day | ORAL | Status: DC
  Administered 2012-09-24 – 2012-09-29 (×6): 50 mg via ORAL
  Filled 2012-09-24 (×9): qty 1

## 2012-09-24 MED ORDER — DIPHENHYDRAMINE HCL 12.5 MG/5ML PO ELIX: 12.5000 mg | ORAL_SOLUTION | Freq: Every day | ORAL | Status: DC | PRN

## 2012-09-24 NOTE — Progress Notes (Signed)
Reviewed reportable signs and symptoms with PT, states he understands when he should call the nurse. Wife at bedside too.  D5W hanging in the event the patient has a reaction. New IV was started (refer to doc flowsheets). Oscar Jacobs

## 2012-09-24 NOTE — Progress Notes (Signed)
Subjective: Continues to have some signs of mania  Exam: Filed Vitals:   09/24/12 0935  BP: 137/65  Pulse: 96  Temp: 97.5 F (36.4 C)  Resp: 18   Gen: In bed, NAD MS: Awake, Alert. Oriented to person, place and year. Slightly disorganized thoughts [id number], EOMI without diplopluia or ptosis.  Motor: 5/5 neck strength, no drift in arms. No fatigable weakness.  Sensory:intact to LT.   Impression: 72 yo M with MG and steroid induced mania. Currently tapering steroids without signs of MG. Given the degree of weakness with which he presented, the decision was made to start IVIG while stopping steroids and start imuran to try to replace the steroid therapy.   Recommendations: 1) Continue tapering steroids. 2) Start imuran 50mg  qday for two weeks, will need weekly CBCs.  3) Continue daily creatinine while on IVIG.  4) Continue IVIG for toal 5 days.   Ritta Slot, MD [facility] [phone]  If 7pm- 7am, please page neurology on call at [phone].    AddendumSherron Monday with outpatient neurologist and wife, patient had significant weakness including neck weakness prior to starting steroids, and I am concerned that stopping his immunosuppression would leave him at risk for a serious flare. In this setting, I would favor giving a dose of IVIG (2gm/kg total given over 5 days) as we stop his steroids  Will also discuss starting other disease modifying therapy such as imuran, though the duration to effect can be on the order of months.    Ritta Slot, MD [facility] [phone]  If 7pm- 7am, please page neurology on call at [phone].

## 2012-09-24 NOTE — Progress Notes (Signed)
At the said time, patient's ivig got completed. Tolerated it well without any reactions observed. Vital signs checked and recorded in epic. Will continue to monitor.

## 2012-09-24 NOTE — Progress Notes (Signed)
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 - receiving IVIG to control myasthenia symptoms -EEG normal  -MRI negative  -Continue Risperdal. Psychiatry consultation performed 09/20/2012 to assist with management of psychosis.  -Patent attorney, if available.  -Neurochecks every 4 hours requested per nursing staff.  Tachycardia  -Likely from agitation. Monitor on telemetry.  Wheeze  -No h/o asthma or COPD. Pro BNP not elevated. Improved with discontinuation of IV fluids.  GERD  -Continue PPI therapy.  Leukocytosis  -Ssuspect de-margination effect of steroids. Monitor. Afebrile  Steroid-induced hyperglycemia  -Mild. Monitor fasting  AKI (acute kidney injury)  -Likely from pre-renal causes.   -Slowly improving  HTN (hypertension)  -Resume ACE.  -Hydralazine 10 mg IV PRN elevated BP.   Code Status: Full Family Communication: Family member in room (indicate person spoken with, relationship, and if by phone, the number) Disposition Plan: Pending   Consultants:  Neurology  Procedures:  IVIG infusion 09/23/12>>>  Antibiotics:  HPI/Subjective: No acute events are noted overnight  Objective: Filed Vitals:   09/23/12 2126 09/24/12 0214 09/24/12 0500 09/24/12 0935  BP: 162/83 154/75 150/73 137/65  Pulse: 103 78 71 96  Temp: 97.9 F (36.6 C)  97.4 F (36.3 C) 97.5 F (36.4 C)  TempSrc: Oral  Oral Oral  Resp: 22 20 18 18   Height:      Weight:      SpO2: 99% 95% 100% 99%    Intake/Output Summary (Last 24 hours) at 09/24/12 1218 Last data filed at 09/24/12 0830  Gross per 24 hour  Intake    900 ml  Output      0 ml  Net    900 ml   Filed Weights   09/20/12 0500 09/20/12 2100 09/23/12 0624  Weight: 85.276 kg (188 lb) 79.5 kg (175 lb 4.3 oz) 81.285 kg (179  lb 3.2 oz)    Exam:   General:  Patient is in no apparent distress  Cardiovascular: Perfused throughout  Respiratory: Normal respiratory effort, no wheezing  Abdomen: Nondistended nontender  Musculoskeletal: No clubbing or cyanosis   Data Reviewed: Basic Metabolic Panel:  Recent Labs Lab 09/17/12 1530 09/19/12 1110 09/20/12 0453 09/21/12 0421 09/24/12 0535  NA 138 137 138 138  --   K 4.2 4.1 3.5 3.6  --   CL 103 99 102 102  --   CO2 25 25 28 25   --   GLUCOSE 137* 118* 82 91  --   BUN 25* 36* 27* 27*  --   CREATININE 1.32 1.58* 1.41* 1.39* 1.23  CALCIUM 9.2 9.6 8.7 8.8  --    Liver Function Tests:  Recent Labs Lab 09/17/12 1530  AST 22  ALT 23  ALKPHOS 54  BILITOT 0.5  PROT 6.5  ALBUMIN 3.5   No results found for this basename: LIPASE, AMYLASE,  in the last 168 hours No results found for this basename: AMMONIA,  in the last 168 hours CBC:  Recent Labs Lab 09/17/12 1530 09/19/12 1110  WBC 11.1* 14.7*  NEUTROABS  --  13.5*  HGB 14.9 15.8  HCT 43.3 45.2  MCV 88.2 88.1  PLT 151 159   Cardiac Enzymes: No results found for this basename: CKTOTAL, CKMB, CKMBINDEX, TROPONINI,  in the last 168 hours BNP (last 3 results)  Recent Labs  09/20/12 0453  PROBNP 117.2   CBG:  Recent Labs Lab 09/22/12 0915 09/22/12 1138 09/23/12 0754 09/24/12 0741 09/24/12 1120  GLUCAP 123* 94 131* 91 107*    No results found for this or any previous visit (from the past 240 hour(s)).   Studies: No results found.  Scheduled Meds: . aspirin EC  81 mg Oral q morning - 10a  . azaTHIOprine  50 mg Oral Daily  . calcium-vitamin D  1 tablet Oral BID  . cholecalciferol  1,000 Units Oral Daily  . enoxaparin (LOVENOX) injection  40 mg Subcutaneous Q24H  . Immune Globulin 5%  400 mg/kg Intravenous Q24 Hr x 5  . loratadine  10 mg Oral Daily  . multivitamin with minerals  1 tablet Oral Daily  . pantoprazole  40 mg Oral Daily  . [START ON 09/27/2012] predniSONE  10 mg  Oral Q breakfast  . [START ON 09/25/2012] predniSONE  20 mg Oral Q breakfast  . pyridostigmine  30 mg Oral Q8H  . ramipril  10 mg Oral Daily  . 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 If 7PM-7AM, please contact night-coverage at [email or web address], password [facility] 09/24/2012, 12:18 PM  LOS: 5 days

## 2012-09-24 NOTE — Clinical Social Work Psych Note (Signed)
Psych CSW reviewed chart and psychiatry documentation.  Psychiatrist, Elsie Saas, MD made recommendations on 09/20/2012.  No other psych needs identified.  Please re-consult psych as necessary during hospital stay.  Vickii Penna, LCSWA [phone]  Clinical Social Work

## 2012-09-25 DIAGNOSIS — F309 Manic episode, unspecified: Secondary | ICD-10-CM | POA: Diagnosis not present

## 2012-09-25 DIAGNOSIS — R7309 Other abnormal glucose: Secondary | ICD-10-CM

## 2012-09-25 DIAGNOSIS — R000 Tachycardia, unspecified: Secondary | ICD-10-CM

## 2012-09-25 DIAGNOSIS — D72829 Elevated white blood cell count, unspecified: Secondary | ICD-10-CM

## 2012-09-25 DIAGNOSIS — F19988 Other psychoactive substance use, unspecified with other psychoactive substance-induced disorder: Secondary | ICD-10-CM

## 2012-09-25 DIAGNOSIS — R062 Wheezing: Secondary | ICD-10-CM

## 2012-09-25 LAB — CBC WITH DIFFERENTIAL/PLATELET
Basophils Absolute: 0 10*3/uL (ref 0.0–0.1)
Basophils Relative: 0 % (ref 0–1)
Eosinophils Absolute: 0 10*3/uL (ref 0.0–0.7)
MCH: 30.9 pg (ref 26.0–34.0)
MCHC: 34.3 g/dL (ref 30.0–36.0)
Neutrophils Relative %: 67 % (ref 43–77)
Platelets: 120 10*3/uL — ABNORMAL LOW (ref 150–400)
RBC: 4.27 MIL/uL (ref 4.22–5.81)
RDW: 14.5 % (ref 11.5–15.5)

## 2012-09-25 LAB — CREATININE, SERUM
Creatinine, Ser: 1.2 mg/dL (ref 0.50–1.35)
GFR calc non Af Amer: 59 mL/min — ABNORMAL LOW (ref >90)

## 2012-09-25 LAB — GLUCOSE, CAPILLARY: Glucose-Capillary: 74 mg/dL (ref 70–99)

## 2012-09-25 NOTE — Progress Notes (Signed)
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  - receiving IVIG to control myasthenia gravis -EEG normal  -MRI negative  -Continue Risperdal. Psychiatry consultation performed 09/20/2012 to assist with management of psychosis.  -Neurochecks every 4 hours requested per nursing staff.  Tachycardia  -Likely from agitation. Monitor on telemetry.  Wheeze  -No h/o asthma or COPD. Pro BNP not elevated. Improved with discontinuation of IV fluids.  GERD  -Continue PPI therapy.  Leukocytosis  -Ssuspect de-margination effect of steroids. Monitor. Afebrile  Steroid-induced hyperglycemia  -Mild. Monitor fasting  AKI (acute kidney injury)  -Likely from pre-renal causes.  -Slowly improving  HTN (hypertension)  -Resume ACE.  -Hydralazine 10 mg IV PRN elevated BP -Mildly elevated, but is likely secondary to aggitation/manic episodes.    Code Status: Full Family Communication: Patient and family in room (indicate person spoken with, relationship, and if by phone, the number) Disposition Plan: Pending   Consultants:  Neurology  Procedures:  IVIG infusion 09/23/12>>>  HPI/Subjective: Patient is without complaints. Staff reports patient does appear to be more manic with each IVIG infusion.  Objective: Filed Vitals:   09/24/12 1624 09/24/12 1756 09/24/12 2147 09/25/12 0546  BP: 160/86 163/83 148/55 155/71  Pulse: 108 96 81 102  Temp: 98.3 F (36.8 C) 98.3 F (36.8 C)  97.4 F (36.3 C)  TempSrc: Oral Oral  Oral  Resp:    20  Height:      Weight:      SpO2: 99% 99% 100% 100%    Intake/Output Summary (Last 24 hours) at 09/25/12 0745 Last data filed at 09/24/12 1300  Gross per 24 hour  Intake    600 ml  Output      0 ml  Net    600 ml   Filed Weights   09/20/12 0500 09/20/12  2100 09/23/12 0624  Weight: 85.276 kg (188 lb) 79.5 kg (175 lb 4.3 oz) 81.285 kg (179 lb 3.2 oz)    Exam:   General:  Patient awake in no apparent distress  Cardiovascular: Regular, S1-S2  Respiratory: Normal respiratory effort, no crackles no wheezing  Abdomen: Soft, positive bowel sounds  Musculoskeletal: Perfused distally, no clubbing or cyanosis   Data Reviewed: Basic Metabolic Panel:  Recent Labs Lab 09/19/12 1110 09/20/12 0453 09/21/12 0421 09/24/12 0535 09/25/12 0500  NA 137 138 138  --   --   K 4.1 3.5 3.6  --   --   CL 99 102 102  --   --   CO2 25 28 25   --   --   GLUCOSE 118* 82 91  --   --   BUN 36* 27* 27*  --   --   CREATININE 1.58* 1.41* 1.39* 1.23 1.20  CALCIUM 9.6 8.7 8.8  --   --    Liver Function Tests: No results found for this basename: AST, ALT, ALKPHOS, BILITOT, PROT, ALBUMIN,  in the last 168 hours No results found for this basename: LIPASE, AMYLASE,  in the last 168 hours No results found for this basename: AMMONIA,  in the last 168 hours CBC:  Recent Labs Lab 09/19/12 1110 09/25/12 0500  WBC 14.7* 6.5  NEUTROABS 13.5* 4.4  HGB 15.8 13.2  HCT 45.2 38.5*  MCV 88.1 90.2  PLT 159 120*   Cardiac Enzymes: No results found for this basename: CKTOTAL, CKMB,  CKMBINDEX, TROPONINI,  in the last 168 hours BNP (last 3 results)  Recent Labs  09/20/12 0453  PROBNP 117.2   CBG:  Recent Labs Lab 09/23/12 0754 09/24/12 0741 09/24/12 1120 09/24/12 1637 09/25/12 0645  GLUCAP 131* 91 107* 148* 74    No results found for this or any previous visit (from the past 240 hour(s)).   Studies: No results found.  Scheduled Meds: . aspirin EC  81 mg Oral q morning - 10a  . azaTHIOprine  50 mg Oral Daily  . calcium-vitamin D  1 tablet Oral BID  . cholecalciferol  1,000 Units Oral Daily  . enoxaparin (LOVENOX) injection  40 mg Subcutaneous Q24H  . Immune Globulin 5%  400 mg/kg Intravenous Q24 Hr x 5  . loratadine  10 mg Oral Daily  .  multivitamin with minerals  1 tablet Oral Daily  . pantoprazole  40 mg Oral Daily  . [START ON 09/27/2012] predniSONE  10 mg Oral Q breakfast  . predniSONE  20 mg Oral Q breakfast  . pyridostigmine  30 mg Oral Q8H  . ramipril  10 mg Oral Daily  . 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 If 7PM-7AM, please contact night-coverage at [email or web address], password [facility] 09/25/2012, 7:45 AM  LOS: 6 days

## 2012-09-25 NOTE — Progress Notes (Signed)
Subjective: Continues to be manic, showing signs of disorganized thoughts, exaggerated ideas of medication effects.   Exam: Filed Vitals:   09/25/12 0546  BP: 155/71  Pulse: 102  Temp: 97.4 F (36.3 C)  Resp: 20   Gen: In bed, NAD MS: Awake, Alert. Oriented to person, place and year. disorganized thoughts [id number], EOMI without diplopluia or ptosis.  Motor: 5/5 neck strength, no drift in arms. No fatigable weakness.  Sensory:intact to LT.   Impression: 72 yo M with MG and steroid induced mania. Currently tapering steroids without signs of MG. Given the degree of weakness with which he presented, the decision was made to start IVIG while stopping steroids and start imuran to try to replace the steroid therapy.   Given history of mania in the family, it may be reasonable to have psychiatry assist in management and ? Need for longer term medications.   Recommendations: 1) Continue tapering steroids. 2) Start imuran 50mg  qday for two weeks, will need weekly CBCs.  3) Continue daily creatinine while on IVIG.  4) Continue IVIG for toal 5 days.  5) Will need frequent CBCs for first month on imuran, will recheck prior to discharge.    Ritta Slot, MD [facility] [phone]  If 7pm- 7am, please page neurology on call at [phone].    AddendumSherron Monday with outpatient neurologist and wife, patient had significant weakness including neck weakness prior to starting steroids, and I am concerned that stopping his immunosuppression would leave him at risk for a serious flare. In this setting, I would favor giving a dose of IVIG (2gm/kg total given over 5 days) as we stop his steroids  Will also discuss starting other disease modifying therapy such as imuran, though the duration to effect can be on the order of months.    Ritta Slot, MD [facility] [phone]  If 7pm- 7am, please page neurology on call at [phone].

## 2012-09-26 DIAGNOSIS — F309 Manic episode, unspecified: Secondary | ICD-10-CM | POA: Diagnosis not present

## 2012-09-26 DIAGNOSIS — G7000 Myasthenia gravis without (acute) exacerbation: Secondary | ICD-10-CM | POA: Diagnosis not present

## 2012-09-26 DIAGNOSIS — I10 Essential (primary) hypertension: Secondary | ICD-10-CM | POA: Diagnosis not present

## 2012-09-26 LAB — CREATININE, SERUM
Creatinine, Ser: 1.15 mg/dL (ref 0.50–1.35)
GFR calc Af Amer: 72 mL/min — ABNORMAL LOW (ref >90)

## 2012-09-26 LAB — GLUCOSE, CAPILLARY: Glucose-Capillary: 74 mg/dL (ref 70–99)

## 2012-09-26 NOTE — Progress Notes (Signed)
Subjective: Continues to be manic, though improved from yesterday per sister.   Exam: Filed Vitals:   09/26/12 0950  BP: 145/79  Pulse: 78  Temp: 97.7 F (36.5 C)  Resp: 18   Gen: In bed, NAD MS: Awake, Alert, some pressured speech [id number], EOMI no ptosis or diploplia with upgaze Motor: 5/5 throughotu without fatiguable weakness.  Sensory:intact to LT   Impression: 72 yo M with MG and steroid induced mania. Currently tapering steroids without signs of MG. Given the degree of weakness with which he presented, the decision was made to start IVIG while stopping steroids and start imuran to try to replace the steroid therapy.    Recommendations: 1) Imuran 50mg  Qday 2) CBC tomorrow.  3) Day 4/5 IVIG today 4) mestinon 30 q8h  Ritta Slot, MD [facility] [phone]  If 7pm- 7am, please page neurology on call at [phone].

## 2012-09-26 NOTE — Progress Notes (Signed)
Occupational Therapy Note  Per PT report, pt has returned to baseline level and has no acute OT needs at this time. Will sign off.  09/26/2012 Cipriano Mile OTR/L Pager [phone] Office [phone]

## 2012-09-26 NOTE — Progress Notes (Signed)
[facility] PROGRESS NOTE  Oscar Jacobs WUJ:[id number] DOB: [birth year] DOA: 09/19/2012 PCP: Ezequiel Kayser, MD  Assessment/Plan: Psychosis due to steroid use / myesthenia gravis  -Slow taper of steroids, with neurological consultation/oversight.  - receiving IVIG to control myasthenia gravis  -EEG normal  -MRI negative  -Continue Risperdal. Psychiatry consultation performed 09/20/2012 to assist with management of psychosis.  GERD  -Continue PPI therapy.  Steroid-induced hyperglycemia  -Mild. Monitor fasting -Controlled AKI (acute kidney injury)  -Likely from pre-renal causes.  -Slowly improving  HTN (hypertension)  -Resume ACE.  -Hydralazine 10 mg IV PRN elevated BP  -Mildly elevated, but is likely secondary to aggitation/manic episodes.   Code Status: Full Family Communication: Pt and wife in room (indicate person spoken with, relationship, and if by phone, the number) Disposition Plan: Pending   Consultants:  Neurology  Procedures:  IVIG infusion 09/23/12>>>   HPI/Subjective: Pt reports feeling better. No complaints.  Objective: Filed Vitals:   09/25/12 2036 09/25/12 2119 09/26/12 0220 09/26/12 0552  BP: 121/65 140/59 142/70 141/75  Pulse: 78 86 64 76  Temp: 97.3 F (36.3 C) 98 F (36.7 C) 97.6 F (36.4 C) 97.8 F (36.6 C)  TempSrc: Oral Oral Oral Oral  Resp: 16 20 20 20   Height:      Weight:      SpO2: 97% 95% 96% 98%   No intake or output data in the 24 hours ending 09/26/12 0747 Filed Weights   09/20/12 0500 09/20/12 2100 09/23/12 0624  Weight: 85.276 kg (188 lb) 79.5 kg (175 lb 4.3 oz) 81.285 kg (179 lb 3.2 oz)    Exam:   General:  Awake, in NAD  Cardiovascular: regular, s1, s2  Respiratory: normal resp effort, no crackles or wheezing  Abdomen: soft, nondistended  Musculoskeletal: perfused distally, no clubbing   Data Reviewed: Basic Metabolic Panel:  Recent Labs Lab 09/19/12 1110 09/20/12 0453 09/21/12 0421 09/24/12 0535  09/25/12 0500 09/26/12 0458  NA 137 138 138  --   --   --   K 4.1 3.5 3.6  --   --   --   CL 99 102 102  --   --   --   CO2 25 28 25   --   --   --   GLUCOSE 118* 82 91  --   --   --   BUN 36* 27* 27*  --   --   --   CREATININE 1.58* 1.41* 1.39* 1.23 1.20 1.15  CALCIUM 9.6 8.7 8.8  --   --   --    Liver Function Tests: No results found for this basename: AST, ALT, ALKPHOS, BILITOT, PROT, ALBUMIN,  in the last 168 hours No results found for this basename: LIPASE, AMYLASE,  in the last 168 hours No results found for this basename: AMMONIA,  in the last 168 hours CBC:  Recent Labs Lab 09/19/12 1110 09/25/12 0500  WBC 14.7* 6.5  NEUTROABS 13.5* 4.4  HGB 15.8 13.2  HCT 45.2 38.5*  MCV 88.1 90.2  PLT 159 120*   Cardiac Enzymes: No results found for this basename: CKTOTAL, CKMB, CKMBINDEX, TROPONINI,  in the last 168 hours BNP (last 3 results)  Recent Labs  09/20/12 0453  PROBNP 117.2   CBG:  Recent Labs Lab 09/24/12 0741 09/24/12 1120 09/24/12 1637 09/25/12 0645 09/26/12 0637  GLUCAP 91 107* 148* 74 74    No results found for this or any previous visit (from the past 240 hour(s)).   Studies: No results  found.  Scheduled Meds: . aspirin EC  81 mg Oral q morning - 10a  . azaTHIOprine  50 mg Oral Daily  . calcium-vitamin D  1 tablet Oral BID  . cholecalciferol  1,000 Units Oral Daily  . enoxaparin (LOVENOX) injection  40 mg Subcutaneous Q24H  . Immune Globulin 5%  400 mg/kg Intravenous Q24 Hr x 5  . loratadine  10 mg Oral Daily  . multivitamin with minerals  1 tablet Oral Daily  . pantoprazole  40 mg Oral Daily  . [START ON 09/27/2012] predniSONE  10 mg Oral Q breakfast  . predniSONE  20 mg Oral Q breakfast  . pyridostigmine  30 mg Oral Q8H  . ramipril  10 mg Oral Daily  . risperiDONE  0.5 mg Oral BID   Continuous Infusions:   Principal Problem:   Psychosis due to steroid use Active Problems:   Myasthenia gravis   Leukocytosis   Steroid-induced  hyperglycemia   AKI (acute kidney injury)   HTN (hypertension)   Tachycardia   Wheeze  Time spent:  CHIU, STEPHEN K  [facility] Pager [phone]. If 7PM-7AM, please contact night-coverage at [email or web address], password [facility] 09/26/2012, 7:47 AM  LOS: 7 days

## 2012-09-26 NOTE — Evaluation (Signed)
Physical Therapy One Time Evaluation/Discharge Patient Details Name: Oscar Jacobs MRN: [id number] DOB: 02/21/1941 Today's Date: 09/26/2012 Time: 4098-1191 PT Time Calculation (min): 8 min  PT Assessment / Plan / Recommendation Clinical Impression  72 y.o. male admitted to [facility] with prednisone induced psychosis.  He presents today seemingly back to his normal baseline (wife confirms mentally and physically).  He does not need any assistance with mobility and his balance was tested via gross screen with no LOB.  He performs stairs well without assitance or difficult with use of one railing.  He has no current or follow up PT needs at this time.  PT to sign off.      PT Assessment  Patent does not need any further PT services    Follow Up Recommendations  No PT follow up    Does the patient have the potential to tolerate intense rehabilitation     NA  Barriers to Discharge    none       Equipment Recommendations  None recommended by PT          Precautions / Restrictions Precautions Precautions: None Restrictions Weight Bearing Restrictions: No   Pertinent Vitals/Pain See vitals flow sheet.       Mobility  Ambulation/Gait Ambulation/Gait Assistance: 7: Independent Ambulation Distance (Feet): 500 Feet Assistive device: None Ambulation/Gait Assistance Details: Pt walking with normal gait pattern, able to demonstrate 90 degree and 360 degree turns normal speed without LOB.  Pt was able to pick up object from floor safely and easily.   Gait Pattern: Within Functional Limits Stairs: Yes Stairs Assistance: 6: Modified independent (Device/Increase time) Stair Management Technique: One rail Right;Forwards Number of Stairs: 9        Visit Information  Last PT Received On: 09/26/12 Assistance Needed: +1    Subjective Data  Subjective: Pt reports that all of this was related to a reaction to steroids.  He does report some very mild sensation of imbalance while turning.    Patient Stated Goal: to go home   Prior Functioning  Home Living Lives With: Spouse Available Help at Discharge: Family;Friend(s);Available 24 hours/day Type of Home: House Home Access: Stairs to enter Home Layout: Multi-level Alternate Level Stairs-Number of Steps: 12 Alternate Level Stairs-Rails: Right Home Adaptive Equipment: None Prior Function Level of Independence: Independent Able to Take Stairs?: Yes Driving: Yes Vocation: Retired Musician: No difficulties    Copywriter, advertising Arousal/Alertness: Awake/alert Behavior During Therapy: WFL for tasks assessed/performed Overall Cognitive Status: Within Functional Limits for tasks assessed    Extremity/Trunk Assessment Right Upper Extremity Assessment RUE ROM/Strength/Tone: [facility] for tasks assessed Left Upper Extremity Assessment LUE ROM/Strength/Tone: WFL for tasks assessed Right Lower Extremity Assessment RLE ROM/Strength/Tone: [facility] for tasks assessed Left Lower Extremity Assessment LLE ROM/Strength/Tone: WFL for tasks assessed      End of Session PT - End of Session Activity Tolerance: Patient tolerated treatment well Patient left: with family/visitor present;Other (comment) (walking in hallway with wife) Nurse Communication: Mobility status    Lurena Joiner B. Lanissa Cashen, PT, DPT [phone]   09/26/2012, 11:38 AM

## 2012-09-26 NOTE — Progress Notes (Signed)
Pt's IVIG completed. Pt tolerated it well and vital signs have been recorded in Epic. RN will continue to monitor.

## 2012-09-27 DIAGNOSIS — I10 Essential (primary) hypertension: Secondary | ICD-10-CM | POA: Diagnosis not present

## 2012-09-27 DIAGNOSIS — F309 Manic episode, unspecified: Secondary | ICD-10-CM | POA: Diagnosis not present

## 2012-09-27 DIAGNOSIS — G7000 Myasthenia gravis without (acute) exacerbation: Secondary | ICD-10-CM | POA: Diagnosis not present

## 2012-09-27 LAB — CBC
HCT: 38.2 % — ABNORMAL LOW (ref 39.0–52.0)
MCV: 89.7 fL (ref 78.0–100.0)
Platelets: 128 10*3/uL — ABNORMAL LOW (ref 150–400)
RBC: 4.26 MIL/uL (ref 4.22–5.81)
WBC: 6.6 10*3/uL (ref 4.0–10.5)

## 2012-09-27 LAB — GLUCOSE, CAPILLARY

## 2012-09-27 LAB — CREATININE, SERUM: Creatinine, Ser: 1.17 mg/dL (ref 0.50–1.35)

## 2012-09-27 NOTE — Progress Notes (Signed)
Subjective: Had some symptoms concerning for mania overnight, but still seems improved from previous days.   Exam: Filed Vitals:   09/27/12 0538  BP: 137/74  Pulse: 70  Temp: 97.5 F (36.4 C)  Resp: 18   Gen: In bed, NAD MS: Awake, Alert, some pressured speech ZO:XWRUE, EOMI no ptosis or diploplia with upgaze Motor: 5/5 throughotu without fatiguable weakness.  Sensory:intact to LT   Impression: 72 yo M with MG and steroid induced mania. Currently tapering steroids without signs of MG. Given the degree of weakness with which he presented, the decision was made to start IVIG while stopping steroids and start imuran to try to replace the steroid therapy.   Recommendations: 1) Imuran 50mg  Qday 2) CBC again early this week. 3) Day 5/5 IVIG today 4) mestinon 30 q8h 5) received last dose of prednisone today. Can D/c at this time.   Ritta Slot, MD [facility] [phone]  If 7pm- 7am, please page neurology on call at [phone].

## 2012-09-27 NOTE — Progress Notes (Signed)
5th dose of IVIG completed at 6pm. Vitals documented in Epic. Pt tolerated well. Will continue to monitor.

## 2012-09-27 NOTE — Progress Notes (Signed)
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  - receiving IVIG to control myasthenia gravis  -Imuran x 2 weeks per Neuro (started 09/25/12) -EEG normal  -MRI negative  -Continue Risperdal. Psychiatry consultation performed 09/20/2012 to assist with management of psychosis.  GERD  -Continue PPI therapy.  Steroid-induced hyperglycemia  -Mild. Monitor fasting  -Controlled  AKI (acute kidney injury)  -Likely from pre-renal causes.  -Slowly improving  HTN (hypertension)  -Resume ACE.  -Hydralazine 10 mg IV PRN elevated BP  -Mildly elevated, but is likely secondary to aggitation/manic episodes.    Code Status: Full Family Communication: Pt and family in room (indicate person spoken with, relationship, and if by phone, the number) Disposition Plan: Pending   Consultants:  Neurology  Procedures: IVIG infusion 09/23/12>>>  HPI/Subjective: Pt reports feeling better. No complaints  Objective: Filed Vitals:   09/26/12 1700 09/26/12 1734 09/26/12 2123 09/27/12 0538  BP: 128/58 126/66 138/65 137/74  Pulse: 88 86 80 70  Temp: 98.2 F (36.8 C) 98 F (36.7 C) 97.6 F (36.4 C) 97.5 F (36.4 C)  TempSrc: Oral Oral Oral Oral  Resp: 16 16 18 18   Height:      Weight:      SpO2: 97% 98% 95% 99%    Intake/Output Summary (Last 24 hours) at 09/27/12 0807 Last data filed at 09/26/12 1500  Gross per 24 hour  Intake    360 ml  Output    350 ml  Net     10 ml   Filed Weights   09/20/12 0500 09/20/12 2100 09/23/12 0624  Weight: 85.276 kg (188 lb) 79.5 kg (175 lb 4.3 oz) 81.285 kg (179 lb 3.2 oz)    Exam:   General:  Awake, in NAD  Cardiovascular: regular, s1, s2  Respiratory: normal respiratory effort, no crackles or wheezing  Abdomen: soft,  nondistended  Musculoskeletal: perfused, no clubbing   Data Reviewed: Basic Metabolic Panel:  Recent Labs Lab 09/21/12 0421 09/24/12 0535 09/25/12 0500 09/26/12 0458 09/27/12 0500  NA 138  --   --   --   --   K 3.6  --   --   --   --   CL 102  --   --   --   --   CO2 25  --   --   --   --   GLUCOSE 91  --   --   --   --   BUN 27*  --   --   --   --   CREATININE 1.39* 1.23 1.20 1.15 1.17  CALCIUM 8.8  --   --   --   --    Liver Function Tests: No results found for this basename: AST, ALT, ALKPHOS, BILITOT, PROT, ALBUMIN,  in the last 168 hours No results found for this basename: LIPASE, AMYLASE,  in the last 168 hours No results found for this basename: AMMONIA,  in the last 168 hours CBC:  Recent Labs Lab 09/25/12 0500 09/27/12 0500  WBC 6.5 6.6  NEUTROABS 4.4  --   HGB 13.2 13.3  HCT 38.5* 38.2*  MCV 90.2 89.7  PLT 120* 128*   Cardiac Enzymes: No results found for this basename: CKTOTAL, CKMB, CKMBINDEX, TROPONINI,  in the last 168 hours BNP (last 3 results)  Recent Labs  09/20/12 0453  PROBNP 117.2  CBG:  Recent Labs Lab 09/24/12 1637 09/25/12 0645 09/26/12 0637 09/26/12 0744 09/27/12 0624  GLUCAP 148* 74 74 109* 73    No results found for this or any previous visit (from the past 240 hour(s)).   Studies: No results found.  Scheduled Meds: . aspirin EC  81 mg Oral q morning - 10a  . azaTHIOprine  50 mg Oral Daily  . calcium-vitamin D  1 tablet Oral BID  . cholecalciferol  1,000 Units Oral Daily  . enoxaparin (LOVENOX) injection  40 mg Subcutaneous Q24H  . Immune Globulin 5%  400 mg/kg Intravenous Q24 Hr x 5  . loratadine  10 mg Oral Daily  . multivitamin with minerals  1 tablet Oral Daily  . pantoprazole  40 mg Oral Daily  . predniSONE  10 mg Oral Q breakfast  . pyridostigmine  30 mg Oral Q8H  . ramipril  10 mg Oral Daily  . risperiDONE  0.5 mg Oral BID   Continuous Infusions:   Principal Problem:   Psychosis due to steroid  use Active Problems:   Myasthenia gravis   Leukocytosis   Steroid-induced hyperglycemia   AKI (acute kidney injury)   HTN (hypertension)   Tachycardia   Wheeze    Time spent:    Oscar Jacobs K  [facility] Pager [phone]. If 7PM-7AM, please contact night-coverage at [email or web address], password [facility] 09/27/2012, 8:07 AM  LOS: 8 days

## 2012-09-28 DIAGNOSIS — I10 Essential (primary) hypertension: Secondary | ICD-10-CM | POA: Diagnosis not present

## 2012-09-28 DIAGNOSIS — G7000 Myasthenia gravis without (acute) exacerbation: Secondary | ICD-10-CM | POA: Diagnosis not present

## 2012-09-28 DIAGNOSIS — F309 Manic episode, unspecified: Secondary | ICD-10-CM | POA: Diagnosis not present

## 2012-09-28 LAB — CBC
HCT: 41.8 % (ref 39.0–52.0)
MCH: 30.6 pg (ref 26.0–34.0)
MCHC: 33.7 g/dL (ref 30.0–36.0)
MCV: 90.7 fL (ref 78.0–100.0)
RDW: 14.3 % (ref 11.5–15.5)

## 2012-09-28 LAB — GLUCOSE, CAPILLARY: Glucose-Capillary: 93 mg/dL (ref 70–99)

## 2012-09-28 LAB — CREATININE, SERUM: Creatinine, Ser: 1.18 mg/dL (ref 0.50–1.35)

## 2012-09-28 MED ORDER — CLONAZEPAM 1 MG PO TABS
1.0000 mg | ORAL_TABLET | Freq: Every day | ORAL | Status: DC
  Administered 2012-09-28: 1 mg via ORAL
  Filled 2012-09-28: qty 1

## 2012-09-28 MED ORDER — RISPERIDONE 1 MG PO TABS
1.0000 mg | ORAL_TABLET | Freq: Two times a day (BID) | ORAL | Status: DC
  Administered 2012-09-29: 1 mg via ORAL
  Filled 2012-09-28 (×4): qty 1

## 2012-09-28 NOTE — Progress Notes (Signed)
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  -completed IVIG for myasthenia gravis  -Imuran x 2 weeks per Neuro (started 09/25/12)  -EEG normal  -MRI negative  -Continue Risperdal. Psychiatry consultation performed 09/20/2012 to assist with management of psychosis.  -Psychiatry re-consulted this AM for competence and management recs GERD  -Continue PPI therapy.  Steroid-induced hyperglycemia  -Mild. Monitor fasting  -Controlled  AKI (acute kidney injury)  -Likely from pre-renal causes.  -Slowly improving  HTN (hypertension)  -Resume ACE.  -Hydralazine 10 mg IV PRN elevated BP  -Mildly elevated, but is likely secondary to aggitation/manic episodes   Code Status: Full Family Communication: Pt in room (indicate person spoken with, relationship, and if by phone, the number) Disposition Plan: Pending   Consultants:  Neurology  Psychiatry  Procedures: IVIG infusion 09/23/12>>>6114  HPI/Subjective: Pt without complaints.   Objective: Filed Vitals:   09/27/12 1800 09/27/12 1829 09/27/12 2302 09/28/12 0649  BP: 141/69 155/76 123/53 158/75  Pulse: 81 82 71 89  Temp: 97.3 F (36.3 C) 97.2 F (36.2 C) 98.6 F (37 C) 97.8 F (36.6 C)  TempSrc: Oral Oral Oral Oral  Resp: 20 18 18 18   Height:      Weight:      SpO2: 97% 99% 100% 100%    Intake/Output Summary (Last 24 hours) at 09/28/12 1116 Last data filed at 09/28/12 0700  Gross per 24 hour  Intake    360 ml  Output    700 ml  Net   -340 ml   Filed Weights   09/20/12 0500 09/20/12 2100 09/23/12 0624  Weight: 85.276 kg (188 lb) 79.5 kg (175 lb 4.3 oz) 81.285 kg (179 lb 3.2 oz)    Exam:   General:  Awake, in nad  Cardiovascular: regular, s1, s2  Respiratory: normal resp effort, no crackles  Abdomen:  soft, nondistended  Musculoskeletal: perfused, no clubbing   Data Reviewed: Basic Metabolic Panel:  Recent Labs Lab 09/24/12 0535 09/25/12 0500 09/26/12 0458 09/27/12 0500 09/28/12 0524  CREATININE 1.23 1.20 1.15 1.17 1.18   Liver Function Tests: No results found for this basename: AST, ALT, ALKPHOS, BILITOT, PROT, ALBUMIN,  in the last 168 hours No results found for this basename: LIPASE, AMYLASE,  in the last 168 hours No results found for this basename: AMMONIA,  in the last 168 hours CBC:  Recent Labs Lab 09/25/12 0500 09/27/12 0500  WBC 6.5 6.6  NEUTROABS 4.4  --   HGB 13.2 13.3  HCT 38.5* 38.2*  MCV 90.2 89.7  PLT 120* 128*   Cardiac Enzymes: No results found for this basename: CKTOTAL, CKMB, CKMBINDEX, TROPONINI,  in the last 168 hours BNP (last 3 results)  Recent Labs  09/20/12 0453  PROBNP 117.2   CBG:  Recent Labs Lab 09/25/12 0645 09/26/12 0637 09/26/12 0744 09/27/12 0624 09/28/12 0643  GLUCAP 74 74 109* 73 93    No results found for this or any previous visit (from the past 240 hour(s)).   Studies: No results found.  Scheduled Meds: . aspirin EC  81 mg Oral q morning - 10a  . azaTHIOprine  50 mg Oral Daily  . calcium-vitamin D  1 tablet Oral BID  . cholecalciferol  1,000 Units Oral Daily  . enoxaparin (LOVENOX) injection  40 mg Subcutaneous Q24H  . loratadine  10 mg Oral Daily  .  multivitamin with minerals  1 tablet Oral Daily  . pantoprazole  40 mg Oral Daily  . pyridostigmine  30 mg Oral Q8H  . ramipril  10 mg Oral Daily  . 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 If 7PM-7AM, please contact night-coverage at  [email or web address], password [facility] 09/28/2012, 11:16 AM  LOS: 9 days

## 2012-09-28 NOTE — Progress Notes (Signed)
Subjective: Restless overnight  Exam: Filed Vitals:   09/28/12 1414  BP: 148/82  Pulse: 100  Temp: 98.3 F (36.8 C)  Resp: 18   Gen: In bed, NAD MS: Awake, Alert, some pressured speech ZO:XWRUE, EOMI no ptosis or diploplia with upgaze Motor: 5/5 throughotu without fatiguable weakness.  Sensory:intact to LT  CBC with low but stable platelet count.   Impression: 72 yo M with MG and steroid induced mania. Currently off steroids.  Given the degree of weakness with which he presented initially, he recieved  IVIG while stopping steroids and have started imuran to try to replace the steroid therapy.   Recommendations: 1) Imuran 50mg  Qday 2) CBC in one week. 3) IVIG completed 4) mestinon 30 q8h 5) No further steroids due to mania precipitated by them  Ritta Slot, MD [facility] [phone]  If 7pm- 7am, please page neurology on call at [phone].

## 2012-09-28 NOTE — Consult Note (Signed)
Reason for Consult: Capacity evaluation and history of Psychosis secondary to Steroids Referring Physician: Dr. [name] is an 72 y.o. male.  HPI: Patient is seen, chart reviewed, and case discussed with patient wife and older daughter who are at bed side. Patient medication has been tapered off is steroids and introduced IM IG for MG. Patient has been tolerating his treatment but continue to be hypomanic with increased energy, decreased need for sleep and excessive talking and tangential thought process. Patient family reports that over he has occasional erratic behaviors, hyperactive,  And increased goal-directed activities. Patient has racing thoughts, distractibility, and concentration deficits. Patient is  Easily redirectable. Patient is pleasant without irritability and anger outbursts during this evaluation. He has no suicidal or homicidal thoughts. He has a strong family history of mental illness especially bipolar disorder, family seems like thinking he has been bipolar II disorder but highly functioning throughout his carrier. No alcohol or drugs use reported.  Patient has poor sleeping and family has to remind him several times prior to him going bed. Patient and family agree  to adjust his medication for better control of his symptoms. He may still have residual affects of steroid in his system for another week.  MSE: Patient is a wake, alert, oriented time, place, person, and situation. Patient has increased speech without pressure, elevated mood and animated euphoric affect. He has tangential and circumstantial thought process but denied suicidal or homicidal ideations intentions or plans. He has no evidence of delusions, paranoia, auditory and visual hallucinations. Patient has fair insight, and judgment are poor impulse control.  Past Medical History  Diagnosis Date  . Sleep apnea   . Hypertension   . Squamous carcinoma   . Myasthenia gravis   . GERD (gastroesophageal reflux  disease)     Past Surgical History  Procedure Laterality Date  . Skin cancer removal      Family History  Problem Relation Age of Onset  . Heart disease Mother   . Cancer - Other Father   . Heart disease Sister   . Heart disease Brother   . Bipolar disorder Sister   . Bipolar disorder Sister     suicide  . Healthy Brother     Social History:  reports that he has never smoked. He does not have any smokeless tobacco history on file. He reports that  drinks alcohol. He reports that he does not use illicit drugs.  Allergies:  Allergies  Allergen Reactions  . Penicillins Rash    Medications: I have reviewed the patient's current medications.  Results for orders placed during the hospital encounter of 09/19/12 (from the past 48 hour(s))  CREATININE, SERUM     Status: Abnormal   Collection Time    09/27/12  5:00 AM      Result Value Range   Creatinine, Ser 1.17  0.50 - 1.35 mg/dL   GFR calc non Af Amer 60 (*) >90 mL/min   GFR calc Af Amer 70 (*) >90 mL/min   Comment:            The eGFR has been calculated     using the CKD EPI equation.     This calculation has not been     validated in all clinical     situations.     eGFR's persistently     <90 mL/min signify     possible Chronic Kidney Disease.  CBC     Status: Abnormal   Collection Time  09/27/12  5:00 AM      Result Value Range   WBC 6.6  4.0 - 10.5 K/uL   RBC 4.26  4.22 - 5.81 MIL/uL   Hemoglobin 13.3  13.0 - 17.0 g/dL   HCT 30.8 (*) 65.7 - 84.6 %   MCV 89.7  78.0 - 100.0 fL   MCH 31.2  26.0 - 34.0 pg   MCHC 34.8  30.0 - 36.0 g/dL   RDW 96.2  95.2 - 84.1 %   Platelets 128 (*) 150 - 400 K/uL  GLUCOSE, CAPILLARY     Status: None   Collection Time    09/27/12  6:24 AM      Result Value Range   Glucose-Capillary 73  70 - 99 mg/dL   Comment 1 Documented in Chart     Comment 2 Notify RN    CREATININE, SERUM     Status: Abnormal   Collection Time    09/28/12  5:24 AM      Result Value Range    Creatinine, Ser 1.18  0.50 - 1.35 mg/dL   GFR calc non Af Amer 60 (*) >90 mL/min   GFR calc Af Amer 69 (*) >90 mL/min   Comment:            The eGFR has been calculated     using the CKD EPI equation.     This calculation has not been     validated in all clinical     situations.     eGFR's persistently     <90 mL/min signify     possible Chronic Kidney Disease.  GLUCOSE, CAPILLARY     Status: None   Collection Time    09/28/12  6:43 AM      Result Value Range   Glucose-Capillary 93  70 - 99 mg/dL  CBC     Status: Abnormal   Collection Time    09/28/12 11:30 AM      Result Value Range   WBC 8.0  4.0 - 10.5 K/uL   RBC 4.61  4.22 - 5.81 MIL/uL   Hemoglobin 14.1  13.0 - 17.0 g/dL   HCT 32.4  40.1 - 02.7 %   MCV 90.7  78.0 - 100.0 fL   MCH 30.6  26.0 - 34.0 pg   MCHC 33.7  30.0 - 36.0 g/dL   RDW 25.3  66.4 - 40.3 %   Platelets 120 (*) 150 - 400 K/uL    No results found.  Positive for mood swings and hyperverbal, grandiose, and racing thoughts.  Blood pressure 158/75, pulse 89, temperature 97.8 F (36.6 C), temperature source Oral, resp. rate 18, height 6\' 2"  (1.88 m), weight 179 lb 3.2 oz (81.285 kg), SpO2 100.00%.   Assessment/Plan: Psychosis secondary Steroid medication Bipolar II disorder, MRE is hypomania  Recommendation:  1. Patient meet criteria for capacity to make his medical decision and living arrangement and does not meet criteria for acute psychiatric hospitalization  2. Will increase Risperidone 1 mg PO BID / psychosis and mania 3. Monitor for the adverse effects especially Dystonia and akathesia 4. Start Clonazepam 1 mg PO Qhs 5. Treatment for MG and IMIG treatment as per neurology 6. Psychiatric consultation service follow up as clinically required.   Oscar Jacobs,JANARDHAHA R. 09/28/2012, 1:16 PM

## 2012-09-29 ENCOUNTER — Other Ambulatory Visit: Payer: Self-pay | Admitting: Diagnostic Neuroimaging

## 2012-09-29 DIAGNOSIS — R000 Tachycardia, unspecified: Secondary | ICD-10-CM

## 2012-09-29 DIAGNOSIS — N179 Acute kidney failure, unspecified: Secondary | ICD-10-CM

## 2012-09-29 DIAGNOSIS — F19988 Other psychoactive substance use, unspecified with other psychoactive substance-induced disorder: Secondary | ICD-10-CM

## 2012-09-29 DIAGNOSIS — G7000 Myasthenia gravis without (acute) exacerbation: Secondary | ICD-10-CM

## 2012-09-29 DIAGNOSIS — D72829 Elevated white blood cell count, unspecified: Secondary | ICD-10-CM

## 2012-09-29 DIAGNOSIS — Z79899 Other long term (current) drug therapy: Secondary | ICD-10-CM

## 2012-09-29 DIAGNOSIS — I10 Essential (primary) hypertension: Secondary | ICD-10-CM

## 2012-09-29 LAB — CREATININE, SERUM: GFR calc non Af Amer: 58 mL/min — ABNORMAL LOW (ref >90)

## 2012-09-29 LAB — GLUCOSE, CAPILLARY

## 2012-09-29 MED ORDER — AZATHIOPRINE 50 MG PO TABS: 50.0000 mg | ORAL_TABLET | Freq: Every day | ORAL | Status: DC

## 2012-09-29 MED ORDER — RISPERIDONE 1 MG PO TABS: 1.0000 mg | ORAL_TABLET | Freq: Two times a day (BID) | ORAL | Status: DC

## 2012-09-29 MED ORDER — CLONAZEPAM 1 MG PO TABS: 1.0000 mg | ORAL_TABLET | Freq: Every day | ORAL | Status: DC

## 2012-09-29 MED ORDER — PYRIDOSTIGMINE BROMIDE 60 MG PO TABS: 30.0000 mg | ORAL_TABLET | Freq: Three times a day (TID) | ORAL | Status: DC

## 2012-09-29 NOTE — Care Management Note (Signed)
    Page 1 of 1   09/29/2012     3:05:22 PM   CARE MANAGEMENT NOTE 09/29/2012  Patient:  Monje,Thaine   Account Number:  [id number]  Date Initiated:  09/21/2012  Documentation initiated by:  Ezekiel Ina  Subjective/Objective Assessment:   pt admitted with cco Psychosis due to steroid use / myesthenia gravis     Action/Plan:   from home   Anticipated DC Date:  09/27/2012   Anticipated DC Plan:  HOME/SELF CARE  In-house referral  Clinical Social Worker      DC Planning Services  CM consult      Choice offered to / List presented to:             Status of service:  Completed, signed off Medicare Important Message given?   (If response is "NO", the following Medicare IM given date fields will be blank) Date Medicare IM given:   Date Additional Medicare IM given:    Discharge Disposition:  HOME/SELF CARE  Per UR Regulation:  Reviewed for med. necessity/level of care/duration of stay  If discussed at Long Length of Stay Meetings, dates discussed:    Comments:  09/21/12 MMcGibboney, RN BSN Chart reviewed. Plan to  transfer to [facility].

## 2012-09-29 NOTE — Discharge Summary (Signed)
Physician Discharge Summary  Oscar Jacobs EAV:[id number] DOB: [birth year] DOA: 09/19/2012  PCP: Ezequiel Kayser, MD  Admit date: 09/19/2012 Discharge date: 09/29/2012  Time spent: 35 minutes  Recommendations for Outpatient Follow-up:  1. 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 male with a PMH of myesthenia gravis, diagnosed 07/13/12, under the care of Dr. Danae Orleans, initially treated with Mestinon. He subsequently was started on prednisone 20 mg daily on 08/13/12 with plans to steadily increase dose to a maintenance dose of 60 mg. The patient began to exhibit excitability with sleep disturbance, hyperreligiosity, confusion and hyperactivity approximately 7 days ago. He was evaluated by the ACT team on 09/17/12 for possible psychiatric hospitalization, but declined admission to [facility] as his symptoms were felt to be secondary to prednisone induced psychosis. He was discharged home with instructions to follow up with Dr. Marjory Lies for dosage adjustment of his prednisone therapy. He took prednisone this morning (dose reduced from 60 mg to 50 mg upon the advise of Dr. Marjory Lies, with plans for a slow taper). The patient does not have a personal history of mental illnesses, but there is a strong family history of mental illnesses/bipolar disorder. There has not been any suicidal or homicidal ideation, although the patient's wife feels unsafe in the home secondary to the patient's erratic behavior.   Hospital Course:  Patient is  bit inpatient service. Given the patient's mania, psychiatry services consult. Patient was recommended to start on Risperdal for psychosis. In addition, the neurology service was consulted with recommendations for continued steroid wean. Ultimately, the patient did undergo 5 treatments with IVIG for myasthenia gravis with improvement. Patient's steroids were gradually tapered and his mental status did improve during his hospital course.Psychiatry was reconsulted later in the hospital course with recommendations for an increased dose of Risperdal. Also, neurology has recommended starting Imuran 50 mg daily for 2 weeks ( first dose on 09/25/2012).  Procedures: IVIG infusion 09/23/12>>>[id number]  Consultations:  Neurology  Psychiatry  Discharge Exam: Filed Vitals:   09/28/12 1414 09/28/12 2204 09/29/12 0512 09/29/12 0824  BP: 148/82 143/79 139/63 134/55  Pulse: 100 85 80 90  Temp: 98.3 F (36.8 C) 98.2 F (36.8 C) 97.4 F (36.3 C) 97.7 F (36.5 C)  TempSrc: Oral Oral Oral Oral  Resp: 18 16 18 18   Height:      Weight:      SpO2: 97% 94% 96% 98%    General: Awake in nad Cardiovascular: regular, s1, s2 Respiratory: normal resp effort, no crackles  Discharge Instructions     Medication List    ASK your doctor about these medications       aspirin EC 81 MG tablet  Take 81 mg by mouth every morning.     calcium-vitamin D 500-200 MG-UNIT per tablet  Commonly known as:  OSCAL WITH D  Take 1 tablet by mouth 2 (two) times daily.     cetirizine 10 MG tablet  Commonly known as:  ZYRTEC  Take 10 mg  by mouth daily.     cholecalciferol 1000 UNITS tablet  Commonly known as:  VITAMIN D  Take 1,000 Units by mouth daily.     multivitamin with minerals Tabs  Take 1 tablet by mouth daily.     omeprazole 20 MG capsule  Commonly known as:  PRILOSEC  Take 20 mg by mouth every morning.     predniSONE 10 MG tablet  Commonly known as:  DELTASONE  Take 20 mg by mouth 3 (three) times daily.      pyridostigmine 60 MG tablet  Commonly known as:  MESTINON  Take 15-30 mg by mouth 4 (four) times daily. 0.25 tab twice daily (am and afternoon), 0.5 tab twice daily (noon and night)     ramipril 10 MG capsule  Commonly known as:  ALTACE  Take 10 mg by mouth daily.       Allergies  Allergen Reactions  . Penicillins Rash      The results of significant diagnostics from this hospitalization (including imaging, microbiology, ancillary and laboratory) are listed below for reference.    Significant Diagnostic Studies: Dg Chest 2 View  09/17/2012   *RADIOLOGY REPORT*  Clinical Data:  Medical clearance, history squamous carcinoma, myasthenia gravis, hypertension  CHEST - 2 VIEW  Comparison: Chest CT 08/06/2012  Findings: Normal heart size, mediastinal contours, and pulmonary vascularity. Mildly tortuous thoracic aorta noted. Lungs hyperinflated but clear. No acute infiltrate, pleural effusion or pneumothorax. Bones appear mildly demineralized.  IMPRESSION: Mildly hyperinflated lungs without acute infiltrate.   Original Report Authenticated By: Ulyses Southward, M.D.   Ct Head Wo Contrast  09/21/2012   *RADIOLOGY REPORT*  Clinical Data: Worsening confusion  CT HEAD WITHOUT CONTRAST  Technique:  Contiguous axial images were obtained from the base of the skull through the vertex without contrast.  Comparison: None.  Findings: The brain has a normal appearance without evidence of accelerated atrophy, old or acute infarction, mass lesion, hemorrhage, hydrocephalus or extra-axial collection.  No calvarial abnormality.  There is a small amount of fluid in the ethmoid and maxillary sinuses, probably subclinical.  IMPRESSION: Normal head CT for a person of this age.  No cause of confusion identified.   Original Report Authenticated By: Paulina Fusi, M.D.   Mr Brain Wo Contrast  09/22/2012   *RADIOLOGY REPORT*  Clinical Data: Altered mental status.  Myasthenia gravis  MRI HEAD WITHOUT CONTRAST  Technique:   Multiplanar, multiecho pulse sequences of the brain and surrounding structures were obtained according to standard protocol without intravenous contrast.  Comparison: CT 09/21/2012  Findings: Generalized atrophy, typical for age.  Negative for acute infarct.  No significant chronic ischemia.  Negative for intracranial hemorrhage.  No fluid collection or mass effect.  Negative for mass or edema.  No shift to the midline structures.  Vessels at the base the brain are patent.  Mucosal edema in the maxillary and ethmoid sinuses.  Air-fluid level left maxillary sinus.  IMPRESSION: No acute intracranial abnormality.  Sinusitis.   Original Report Authenticated By: Janeece Riggers, M.D.    Microbiology: No results found for this or any previous visit (from the past 240 hour(s)).   Labs: Basic Metabolic Panel:  Recent Labs Lab 09/25/12 0500 09/26/12 0458 09/27/12 0500 09/28/12 0524 09/29/12 0610  CREATININE 1.20 1.15 1.17 1.18 1.21   Liver Function Tests: No results found for this basename: AST, ALT, ALKPHOS, BILITOT, PROT, ALBUMIN,  in the last 168 hours No results found for this basename: LIPASE, AMYLASE,  in the last 168  hours No results found for this basename: AMMONIA,  in the last 168 hours CBC:  Recent Labs Lab 09/25/12 0500 09/27/12 0500 09/28/12 1130  WBC 6.5 6.6 8.0  NEUTROABS 4.4  --   --   HGB 13.2 13.3 14.1  HCT 38.5* 38.2* 41.8  MCV 90.2 89.7 90.7  PLT 120* 128* 120*   Cardiac Enzymes: No results found for this basename: CKTOTAL, CKMB, CKMBINDEX, TROPONINI,  in the last 168 hours BNP: BNP (last 3 results)  Recent Labs  09/20/12 0453  PROBNP 117.2   CBG:  Recent Labs Lab 09/26/12 0637 09/26/12 0744 09/27/12 0624 09/28/12 0643 09/29/12 0752  GLUCAP 74 109* 73 93 94       Signed:  CHIU, STEPHEN K  [facility] 09/29/2012, 9:51 AM

## 2012-09-30 ENCOUNTER — Telehealth: Payer: Self-pay

## 2012-09-30 NOTE — Telephone Encounter (Signed)
Spoke to pt, scheduled brief OV on 10/05/12.

## 2012-10-05 ENCOUNTER — Ambulatory Visit: Payer: Medicare Other | Admitting: Neurology

## 2012-10-05 ENCOUNTER — Other Ambulatory Visit: Payer: Self-pay | Admitting: *Deleted

## 2012-10-05 ENCOUNTER — Ambulatory Visit (INDEPENDENT_AMBULATORY_CARE_PROVIDER_SITE_OTHER): Payer: Medicare Other | Admitting: Diagnostic Neuroimaging

## 2012-10-05 ENCOUNTER — Encounter: Payer: Self-pay | Admitting: Diagnostic Neuroimaging

## 2012-10-05 ENCOUNTER — Telehealth: Payer: Self-pay | Admitting: Diagnostic Neuroimaging

## 2012-10-05 VITALS — BP 113/64 | HR 76 | Temp 98.2°F | Ht 69.5 in | Wt 190.5 lb

## 2012-10-05 DIAGNOSIS — G7000 Myasthenia gravis without (acute) exacerbation: Secondary | ICD-10-CM

## 2012-10-05 MED ORDER — AZATHIOPRINE 50 MG PO TABS: 50.0000 mg | ORAL_TABLET | Freq: Every day | ORAL | Status: AC

## 2012-10-05 NOTE — Patient Instructions (Addendum)
We will check weekly blood tests over the next 1 month.   -VRP

## 2012-10-05 NOTE — Progress Notes (Signed)
[facility]  PATIENT: Oscar Jacobs DOB: 1941-03-12  REFERRING CLINICIAN:  HISTORY FROM: patient and wife REASON FOR VISIT: follow up   HISTORICAL  CHIEF COMPLAINT:  Chief Complaint  Patient presents with  . Follow-up    myasthenia gravis    HISTORY OF PRESENT ILLNESS:   UPDATE 10/05/12: Since last visit, course complicated by steroid psychosis/mania, to the point of needing hospitalization. He was kindly and expertly managed by medical and neurohospitalists, and tapered off prednisone, treated with IVIG, and then started on imuran. Doing well now. Mania is almost fully resolved. Still a bit "hyper" per wife, but much improved. Platelets trended down near end of hospital stay (last 120). Here for follow up labs as well.  UPDATE 09/10/12: Since last visit, doing much better on prednisone. Now on prednisone 60mg  daily. Taking calcium + Vit D, omeprazole. No side effects, except for slightly "buzzy" mood. Sleep apnea better. Strength improved. Almost back to normal. Some left ptosis persists. Some chewing difficulty towards end of meals.  UPDATE 08/13/12: Since last visit, EMG and lab testing confirm myasthenia gravis diagnosis. I prescribed Mestinon for patient. He has had a positive response. He tried 30 mg 3 times a day, but now takes 15 mg 6 times a day, do to see increasing secretions and diarrhea. Medications lasting approximately 4 hours. No shortness of breath. Continues to have left ptosis, some neck weakness and left arm weakness. Some mild slurred speech. No trouble swallowing.  PRIOR HPI (08/06/12): 72 year old right-handed male with history of hypertension, seasonal allergies, here for evaluation of possible myasthenia gravis.  6 weeks ago patient noted left eyelid drooping. No double vision. Over the next few weeks he had increasing weakness in keeping his head upright as well as difficulty with talking and chewing. Since this past Saturday symptoms have been  significantly worse. He denies any shortness of breath or choking problems. He has mild weakness in his arms and no weakness in his legs. No headache, confusion, chest pain. No similar symptoms in the past.  Patient does have diagnosis of sleep apnea, but he has not used his CPAP machine last 2 or 3 years.  REVIEW OF SYSTEMS: Full 14 system review of systems performed and notable only for insomnia snoring.  ALLERGIES: Allergies  Allergen Reactions  . Penicillins Rash    HOME MEDICATIONS:  Prior to Admission medications   Medication Sig Start Date End Date Taking? Authorizing Provider  aspirin EC 81 MG tablet Take 81 mg by mouth every morning.   Yes Historical Provider, MD  azaTHIOprine (IMURAN) 50 MG tablet Take 1 tablet (50 mg total) by mouth daily. 10/05/12 10/15/12 Yes Suanne Marker, MD  calcium-vitamin D (OSCAL WITH D) 500-200 MG-UNIT per tablet Take 1 tablet by mouth 2 (two) times daily.   Yes Historical Provider, MD  cetirizine (ZYRTEC) 10 MG tablet Take 10 mg by mouth daily.   Yes Historical Provider, MD  cholecalciferol (VITAMIN D) 1000 UNITS tablet Take 1,000 Units by mouth daily.   Yes Historical Provider, MD  diphenhydrAMINE (BENADRYL) 25 MG tablet Take 25 mg by mouth at bedtime.   Yes Historical Provider, MD  Multiple Vitamin (MULTIVITAMIN WITH MINERALS) TABS Take 1 tablet by mouth daily.   Yes Historical Provider, MD  omeprazole (PRILOSEC) 20 MG capsule Take 20 mg by mouth every morning.    Yes Historical Provider, MD  pyridostigmine (MESTINON) 60 MG tablet Take 0.5 tablets (30 mg total) by mouth every 8 (eight) hours. 09/29/12  Yes Jerald Kief, MD  ramipril (ALTACE) 10 MG capsule Take 10 mg by mouth daily.   Yes Historical Provider, MD    PAST MEDICAL HISTORY: Past Medical History  Diagnosis Date  . Sleep apnea   . Hypertension   . Squamous carcinoma   . Myasthenia gravis   . GERD (gastroesophageal reflux disease)     PAST SURGICAL HISTORY: Past Surgical  History  Procedure Laterality Date  . Skin cancer removal      FAMILY HISTORY: Family History  Problem Relation Age of Onset  . Heart disease Mother   . Cancer - Other Father   . Heart disease Sister   . Heart disease Brother   . Bipolar disorder Sister   . Bipolar disorder Sister     suicide  . Healthy Brother     SOCIAL HISTORY:  History   Social History  . Marital Status: Married    Spouse Name: [name] Emmit Oriley    Number of Children: 3  . Years of Education: MD   Occupational History  . Retired     Radiology MD   Social History Main Topics  . Smoking status: Never Smoker   . Smokeless tobacco: Never Used  . Alcohol Use: Yes     Comment: seldom  . Drug Use: No  . Sexually Active: Not on file   Other Topics Concern  . Not on file   Social History Narrative   Married.  Ambulates unassisted.   Pt lives at home with his spouse.   Caffeine Use- 4 cups daily.     PHYSICAL EXAM  Filed Vitals:   10/05/12 1353  BP: 113/64  Pulse: 76  Temp: 98.2 F (36.8 C)  TempSrc: Oral  Height: 5' 9.5" (1.765 m)  Weight: 190 lb 8 oz (86.41 kg)   Body mass index is 27.74 kg/(m^2).  GENERAL EXAM: Patient is in no distress  CARDIOVASCULAR: Regular rate and rhythm, no murmurs, no carotid bruits  NEUROLOGIC: MENTAL STATUS: awake, alert, language fluent, comprehension intact, naming intact CRANIAL NERVE: pupils equal and reactive to light, visual fields full to confrontation, extraocular muscles intact, no nystagmus, facial sensation and strength symmetric, uvula midline, shoulder shrug symmetric, tongue midline. LEFT PTOSIS.  MOTOR: 5/5 IN BUE AND BLE.  SENSORY: normal and symmetric to light touch. COORDINATION: finger-nose-finger, fine finger movements normal REFLEXES: deep tendon reflexes present and symmetric; TRACE AT ANKLES. GAIT/STATION: narrow based gait   DIAGNOSTIC DATA (LABS, IMAGING, TESTING) - I reviewed patient records, labs, notes, testing and  imaging myself where available.  Lab Results  Component Value Date   WBC 8.0 09/28/2012   HGB 14.1 09/28/2012   HCT 41.8 09/28/2012   MCV 90.7 09/28/2012   PLT 120* 09/28/2012   Lab Results  Component Value Date   CREATININE 1.21 09/29/2012   BUN 27* 09/21/2012   NA 138 09/21/2012   K 3.6 09/21/2012   CL 102 09/21/2012   CO2 25 09/21/2012   Lab Results  Component Value Date   ALT 23 09/17/2012   AST 22 09/17/2012   ALKPHOS 54 09/17/2012   BILITOT 0.5 09/17/2012     08/05/12 AchR binding 33.80 (h), AchR blocking 36% (h), AchR modulating 38% (h), anti-striational (1:80 (h)  08/05/12 B12 643, TSH 4.07, FT4 1.1, BUN/Cr 21/1.7  08/06/12 CT chest - no thymoma, small nodules within the thyroid gland are likely benign, left lower lobe pulmonary nodule, Bosniak I cyst of the right kidney is partially imaged, coronary artery  calcifications.  08/06/12 EMG/NCS  1. Decremental response on repetitive nerve stimulation study of greater than 30%, consistent with neuromuscular junction disorder such as myasthenia gravis. 2. No evidence of underlying myopathy or motor neuropathy. 3. Decreased motor unit recruitment of the left flexor carpi radialis muscle may indicate underlying cervical radiculopathy (C6, C7), but not confirmed on paraspinal muscle testing or nerve conduction studies.    ASSESSMENT AND PLAN  72 y.o. year old male  has a past medical history of Sleep apnea; Hypertension; Squamous carcinoma; Myasthenia gravis; and GERD (gastroesophageal reflux disease). here with new onset myasthenia gravis based on history and exam, EMG and lab testing. Did well with mestinon and prednisone until he developed steroid psychosis/mania. Now transitioning to imuran.   Problem List:  Myasthenia gravis - stable  Steroid psychosis/mania - resolving  Thrombocytopenia - needs followup   PLAN: 1. Continue Mestinon 30 mg TID 2. Continue imuran 50mg  daily 3. Weekly CBC and CMP; if platelets stabilize, then will  increase imuran dosing up to target of 150-200mg  daily; otherwise have to consider cellcept or cyclosporine 4. Referral to Dr. Evelene Croon at patient and wife's request    Suanne Marker, MD 10/05/2012, 2:43 PM Certified in Neurology, Neurophysiology and Neuroimaging  [facility] [address], [address] [place], [place] [address] [phone]

## 2012-10-06 LAB — COMPREHENSIVE METABOLIC PANEL
Albumin/Globulin Ratio: 1 — ABNORMAL LOW (ref 1.1–2.5)
Albumin: 3.3 g/dL — ABNORMAL LOW (ref 3.5–4.8)
BUN: 11 mg/dL (ref 8–27)
Calcium: 8.6 mg/dL (ref 8.6–10.2)
Creatinine, Ser: 1.15 mg/dL (ref 0.76–1.27)
GFR calc non Af Amer: 63 mL/min/{1.73_m2} (ref >59)
Globulin, Total: 3.4 g/dL (ref 1.5–4.5)
Glucose: 82 mg/dL (ref 65–99)
Total Protein: 6.7 g/dL (ref 6.0–8.5)

## 2012-10-06 LAB — CBC WITH DIFFERENTIAL
Basos: 1 % (ref 0–3)
Eos: 2 % (ref 0–5)
Eosinophils Absolute: 0.1 10*3/uL (ref 0.0–0.4)
HCT: 35.7 % — ABNORMAL LOW (ref 37.5–51.0)
Hemoglobin: 12.1 g/dL — ABNORMAL LOW (ref 12.6–17.7)
MCH: 30.1 pg (ref 26.6–33.0)
MCHC: 33.9 g/dL (ref 31.5–35.7)
Monocytes Absolute: 0.9 10*3/uL (ref 0.1–0.9)
Neutrophils Absolute: 3.6 10*3/uL (ref 1.4–7.0)
RBC: 4.02 x10E6/uL — ABNORMAL LOW (ref 4.14–5.80)

## 2012-10-06 NOTE — Telephone Encounter (Signed)
Spoke to pt. Sched 4 week OV follow up. Pt said having trouble getting Imuran filled b/c already has Rx not completed. Pt is going on vacation will original Rx will run out need additional. Spoke w/ pharmacy tech advised to have patient's pharmacy get vacation override. Explained to pt. Agreed.

## 2012-10-08 ENCOUNTER — Telehealth: Payer: Self-pay | Admitting: Diagnostic Neuroimaging

## 2012-10-08 NOTE — Telephone Encounter (Signed)
I called patient with results. Doing well. Continue current imuran (50mg  daily), repeat blood tests next week after he comes back in town, and then we will consider to increase to target dose of 150-200mg  daily. -VRP

## 2012-10-08 NOTE — Telephone Encounter (Addendum)
Calling to get test results.  Going out of town on Friday and would like to get a call back today.

## 2012-10-15 ENCOUNTER — Telehealth: Payer: Self-pay | Admitting: *Deleted

## 2012-10-15 DIAGNOSIS — G7000 Myasthenia gravis without (acute) exacerbation: Secondary | ICD-10-CM

## 2012-10-15 NOTE — Telephone Encounter (Signed)
Pt is back from trip.  Will be by tomorrow at 0800 for repeat lab drive.

## 2012-10-16 ENCOUNTER — Other Ambulatory Visit: Payer: Self-pay | Admitting: Diagnostic Neuroimaging

## 2012-10-16 DIAGNOSIS — G7000 Myasthenia gravis without (acute) exacerbation: Secondary | ICD-10-CM | POA: Diagnosis not present

## 2012-10-16 NOTE — Addendum Note (Signed)
Addended byHermenia Fiscal on: 10/16/2012 09:32 AM   Modules accepted: Orders

## 2012-10-17 LAB — COMPREHENSIVE METABOLIC PANEL
ALT: 31 IU/L (ref 0–44)
AST: 41 IU/L — ABNORMAL HIGH (ref 0–40)
Albumin/Globulin Ratio: 1.2 (ref 1.1–2.5)
Alkaline Phosphatase: 53 IU/L (ref 39–117)
BUN/Creatinine Ratio: 11 (ref 10–22)
Calcium: 8.9 mg/dL (ref 8.6–10.2)
Creatinine, Ser: 1.2 mg/dL (ref 0.76–1.27)
GFR calc non Af Amer: 60 mL/min/{1.73_m2} (ref >59)
Globulin, Total: 3.1 g/dL (ref 1.5–4.5)
Potassium: 4.1 mmol/L (ref 3.5–5.2)
Sodium: 138 mmol/L (ref 134–144)
Total Bilirubin: 0.2 mg/dL (ref 0.0–1.2)

## 2012-10-17 LAB — CBC WITH DIFFERENTIAL
Basophils Absolute: 0 10*3/uL (ref 0.0–0.2)
Basos: 0 % (ref 0–3)
HCT: 37.5 % (ref 37.5–51.0)
Hemoglobin: 12.8 g/dL (ref 12.6–17.7)
Lymphocytes Absolute: 1.9 10*3/uL (ref 0.7–3.1)
Lymphs: 21 % (ref 14–46)
MCHC: 34.1 g/dL (ref 31.5–35.7)
MCV: 88 fL (ref 79–97)
Monocytes: 8 % (ref 4–12)
Neutrophils Absolute: 6.2 10*3/uL (ref 1.4–7.0)
RDW: 15.1 % (ref 12.3–15.4)
WBC: 9 10*3/uL (ref 3.4–10.8)

## 2012-10-20 ENCOUNTER — Telehealth: Payer: Self-pay

## 2012-10-20 MED ORDER — AZATHIOPRINE 50 MG PO TABS: 100.0000 mg | ORAL_TABLET | Freq: Every day | ORAL | Status: DC

## 2012-10-20 NOTE — Telephone Encounter (Signed)
I called patient with results. Blood counts look good. And platelets have come up nicely up to 299. In retrospect this may represent heparin-induced trauma cytopenia while in the hospital. LFT is also are good except for borderline AST of 41. I will increase Imuran up to 100 mg daily and have patient followup in 2 weeks. At that time we'll continue checking blood tests.  MG symptoms stable. On mestinon 30mg  TID. Doing well.   Suanne Marker, MD 10/20/2012, 3:29 PM Certified in Neurology, Neurophysiology and Neuroimaging  [facility] [address] [place], [place] [address] [phone]

## 2012-10-20 NOTE — Telephone Encounter (Signed)
Patient called. He would like his most recent lab results and to be advised if he needs to change his medications. Please advise.

## 2012-10-28 DIAGNOSIS — Z6826 Body mass index (BMI) 26.0-26.9, adult: Secondary | ICD-10-CM | POA: Diagnosis not present

## 2012-10-28 DIAGNOSIS — F29 Unspecified psychosis not due to a substance or known physiological condition: Secondary | ICD-10-CM | POA: Diagnosis not present

## 2012-10-28 DIAGNOSIS — I10 Essential (primary) hypertension: Secondary | ICD-10-CM | POA: Diagnosis not present

## 2012-10-28 DIAGNOSIS — G7000 Myasthenia gravis without (acute) exacerbation: Secondary | ICD-10-CM | POA: Diagnosis not present

## 2012-10-28 DIAGNOSIS — K219 Gastro-esophageal reflux disease without esophagitis: Secondary | ICD-10-CM | POA: Diagnosis not present

## 2012-11-06 ENCOUNTER — Ambulatory Visit (INDEPENDENT_AMBULATORY_CARE_PROVIDER_SITE_OTHER): Payer: Medicare Other | Admitting: Diagnostic Neuroimaging

## 2012-11-06 ENCOUNTER — Encounter: Payer: Self-pay | Admitting: Diagnostic Neuroimaging

## 2012-11-06 ENCOUNTER — Telehealth: Payer: Self-pay | Admitting: Diagnostic Neuroimaging

## 2012-11-06 VITALS — BP 116/67 | HR 62 | Ht 69.5 in | Wt 184.0 lb

## 2012-11-06 DIAGNOSIS — G7000 Myasthenia gravis without (acute) exacerbation: Secondary | ICD-10-CM

## 2012-11-06 LAB — CBC WITH DIFFERENTIAL
Basophils Absolute: 0 10*3/uL (ref 0.0–0.2)
Eosinophils Absolute: 0.2 10*3/uL (ref 0.0–0.4)
HCT: 36.5 % — ABNORMAL LOW (ref 37.5–51.0)
Lymphocytes Absolute: 1.5 10*3/uL (ref 0.7–3.1)
Lymphs: 23 % (ref 14–46)
MCHC: 34.5 g/dL (ref 31.5–35.7)
Neutrophils Absolute: 4.2 10*3/uL (ref 1.4–7.0)
Platelets: 166 10*3/uL (ref 150–379)
RDW: 15.7 % — ABNORMAL HIGH (ref 12.3–15.4)

## 2012-11-06 LAB — COMPREHENSIVE METABOLIC PANEL
ALT: 37 IU/L (ref 0–44)
AST: 35 IU/L (ref 0–40)
CO2: 27 mmol/L (ref 18–29)
Calcium: 8.9 mg/dL (ref 8.6–10.2)
Creatinine, Ser: 1.21 mg/dL (ref 0.76–1.27)
Glucose: 93 mg/dL (ref 65–99)
Potassium: 3.6 mmol/L (ref 3.5–5.2)
Sodium: 141 mmol/L (ref 134–144)
Total Protein: 6.5 g/dL (ref 6.0–8.5)

## 2012-11-06 NOTE — Patient Instructions (Signed)
Will check labs today. Then may adjust imuran. We will call you.

## 2012-11-06 NOTE — Telephone Encounter (Signed)
I reviewed patient labs. Labs look good. I called patient and told him to increase Imuran to 150 mg per day. I will see him back in one month for revisit and peak lab testing.   Suanne Marker, MD 11/06/2012, 5:26 PM Certified in Neurology, Neurophysiology and Neuroimaging  [facility] [address] [place], [place] [address] [phone]

## 2012-11-06 NOTE — Progress Notes (Signed)
[facility]  PATIENT: Oscar Jacobs DOB: 03-08-1941  REFERRING CLINICIAN:  HISTORY FROM: patient and wife REASON FOR VISIT: follow up   HISTORICAL  CHIEF COMPLAINT:  Chief Complaint  Patient presents with  . Follow-up    HISTORY OF PRESENT ILLNESS:   UPDATE 11/06/12: Doing well on imuran 100mg  daily. No MG flares. Left ptosis is better. No mania or mood disturbances. Last labs were improved. Taking mestinon 30mg  TID. Sometimes has diarrhea, and then takes 15mg  dose.  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 He was kindly and expertly managed by medical and neurohospitalists, and tapered off prednisone, treated with IVIG, and then started on imuran. Doing well now. Mania is almost fully resolved. Still a bit "hyper" per wife, but much improved. Platelets trended down near end of hospital stay (last 120). Here for follow up labs as well.  UPDATE 09/10/12: Since last visit, doing much better on prednisone. Now on prednisone 60mg  daily. Taking calcium + Vit D, omeprazole. No side effects, except for slightly "buzzy" mood. Sleep apnea better. Strength improved. Almost back to normal. Some left ptosis persists. Some chewing difficulty towards end of meals.  UPDATE 08/13/12: Since last visit, EMG and lab testing confirm myasthenia gravis diagnosis. I prescribed Mestinon for patient. He has had a positive response. He tried 30 mg 3 times a day, but now takes 15 mg 6 times a day, do to see increasing secretions and diarrhea. Medications lasting approximately 4 hours. No shortness of breath. Continues to have left ptosis, some neck weakness and left arm weakness. Some mild slurred speech. No trouble swallowing.  PRIOR HPI (08/06/12): 72 year old right-handed male with history of hypertension, seasonal allergies, here for evaluation of possible myasthenia gravis.  6 weeks ago patient noted left eyelid  drooping. No double vision. Over the next few weeks he had increasing weakness in keeping his head upright as well as difficulty with talking and chewing. Since this past Saturday symptoms have been significantly worse. He denies any shortness of breath or choking problems. He has mild weakness in his arms and no weakness in his legs. No headache, confusion, chest pain. No similar symptoms in the past.  Patient does have diagnosis of sleep apnea, but he has not used his CPAP machine last 2 or 3 years.  REVIEW OF SYSTEMS: Full 14 system review of systems performed and notable only for occ diarrhea.  ALLERGIES: Allergies  Allergen Reactions  . Penicillins Rash    HOME MEDICATIONS:  Prior to Admission medications   Medication Sig Start Date End Date Taking? Authorizing Provider  aspirin EC 81 MG tablet Take 81 mg by mouth every morning.   Yes Historical Provider, MD  azaTHIOprine (IMURAN) 50 MG tablet Take 1 tablet (50 mg total) by mouth daily. 10/05/12 10/15/12 Yes Suanne Marker, MD  calcium-vitamin D (OSCAL WITH D) 500-200 MG-UNIT per tablet Take 1 tablet by mouth 2 (two) times daily.   Yes Historical Provider, MD  cetirizine (ZYRTEC) 10 MG tablet Take 10 mg by mouth daily.   Yes Historical Provider, MD  cholecalciferol (VITAMIN D) 1000 UNITS tablet Take 1,000 Units by mouth daily.   Yes Historical Provider, MD  diphenhydrAMINE (BENADRYL) 25 MG tablet Take 25 mg by mouth at bedtime.   Yes Historical Provider, MD  Multiple Vitamin (MULTIVITAMIN WITH MINERALS) TABS Take 1 tablet by mouth daily.   Yes Historical Provider, MD  omeprazole (PRILOSEC) 20 MG capsule Take 20  mg by mouth every morning.    Yes Historical Provider, MD  pyridostigmine (MESTINON) 60 MG tablet Take 0.5 tablets (30 mg total) by mouth every 8 (eight) hours. 09/29/12  Yes Jerald Kief, MD  ramipril (ALTACE) 10 MG capsule Take 10 mg by mouth daily.   Yes Historical Provider, MD    PAST MEDICAL HISTORY: Past Medical  History  Diagnosis Date  . Sleep apnea   . Hypertension   . Squamous carcinoma   . Myasthenia gravis   . GERD (gastroesophageal reflux disease)     PAST SURGICAL HISTORY: Past Surgical History  Procedure Laterality Date  . Skin cancer removal      FAMILY HISTORY: Family History  Problem Relation Age of Onset  . Heart disease Mother   . Cancer - Other Father   . Heart disease Sister   . Heart disease Brother   . Bipolar disorder Sister   . Bipolar disorder Sister     suicide  . Healthy Brother     SOCIAL HISTORY:  History   Social History  . Marital Status: Married    Spouse Name: [name] Gabor Lusk    Number of Children: 3  . Years of Education: MD   Occupational History  . Retired     Radiology MD   Social History Main Topics  . Smoking status: Never Smoker   . Smokeless tobacco: Never Used  . Alcohol Use: Yes     Comment: seldom  . Drug Use: No  . Sexually Active: Not on file   Other Topics Concern  . Not on file   Social History Narrative   Married.  Ambulates unassisted.   Pt lives at home with his spouse.   Caffeine Use- 4 cups daily.     PHYSICAL EXAM  Filed Vitals:   11/06/12 1129  BP: 116/67  Pulse: 62  Height: 5' 9.5" (1.765 m)  Weight: 184 lb (83.462 kg)   Body mass index is 26.79 kg/(m^2).  GENERAL EXAM: Patient is in no distress  CARDIOVASCULAR: Regular rate and rhythm, no murmurs, no carotid bruits  NEUROLOGIC: MENTAL STATUS: awake, alert, language fluent, comprehension intact, naming intact CRANIAL NERVE: pupils equal and reactive to light, visual fields full to confrontation, extraocular muscles intact, no nystagmus, facial sensation and strength symmetric, uvula midline, shoulder shrug symmetric, tongue midline. SUBTLE LEFT PTOSIS.  MOTOR: 5/5 IN BUE AND BLE.  SENSORY: normal and symmetric to light touch. COORDINATION: finger-nose-finger, fine finger movements normal REFLEXES: deep tendon reflexes present and  symmetric; TRACE AT ANKLES. GAIT/STATION: narrow based gait   DIAGNOSTIC DATA (LABS, IMAGING, TESTING) - I reviewed patient records, labs, notes, testing and imaging myself where available.  Lab Results  Component Value Date   WBC 9.0 10/16/2012   HGB 12.8 10/16/2012   HCT 37.5 10/16/2012   MCV 88 10/16/2012   PLT 299 10/16/2012   Lab Results  Component Value Date   CREATININE 1.20 10/16/2012   BUN 13 10/16/2012   NA 138 10/16/2012   K 4.1 10/16/2012   CL 105 10/16/2012   CO2 22 10/16/2012   Lab Results  Component Value Date   ALT 31 10/16/2012   AST 41* 10/16/2012   ALKPHOS 53 10/16/2012   BILITOT 0.2 10/16/2012     08/05/12 AchR binding 33.80 (h), AchR blocking 36% (h), AchR modulating 38% (h), anti-striational (1:80 (h)  08/05/12 B12 643, TSH 4.07, FT4 1.1, BUN/Cr 21/1.7  08/06/12 CT chest - no thymoma, small nodules within the  thyroid gland are likely benign, left lower lobe pulmonary nodule, Bosniak I cyst of the right kidney is partially imaged, coronary artery calcifications.  08/06/12 EMG/NCS  1. Decremental response on repetitive nerve stimulation study of greater than 30%, consistent with neuromuscular junction disorder such as myasthenia gravis. 2. No evidence of underlying myopathy or motor neuropathy. 3. Decreased motor unit recruitment of the left flexor carpi radialis muscle may indicate underlying cervical radiculopathy (C6, C7), but not confirmed on paraspinal muscle testing or nerve conduction studies.    ASSESSMENT AND PLAN  72 y.o. year old male  has a past medical history of Sleep apnea; Hypertension; Squamous carcinoma; Myasthenia gravis; and GERD (gastroesophageal reflux disease). here with myasthenia gravis based on history and exam, EMG and lab testing. Did well with mestinon and prednisone until he developed steroid psychosis/mania. Now transitioning to imuran.   Problem List:  Myasthenia gravis - stable  Steroid psychosis/mania -  RESOLVED  Thrombocytopenia - RESOLVED   PLAN: 1. Continue Mestinon 30 mg TID 2. Continue imuran 100mg  daily 3. Repeat CBC and CMP; if stable, then will increase imuran dosing up to target of 150mg  daily    Suanne Marker, MD 11/06/2012, 12:11 PM Certified in Neurology, Neurophysiology and Neuroimaging  [facility] [address], [address] [place], [place] [address] [phone]

## 2012-12-02 ENCOUNTER — Other Ambulatory Visit: Payer: Self-pay

## 2012-12-11 ENCOUNTER — Telehealth: Payer: Self-pay | Admitting: Diagnostic Neuroimaging

## 2012-12-11 NOTE — Telephone Encounter (Signed)
Received email from pt about a f/u w/Dr. Marjory Lies. Per Dr. Richrd Humbles notes pt needs to f/u in one mth 12/07/12 but the next apt is 01/21/13, called pt and pt states per Dr. Marjory Lies he needs to get in before that date and needs someone to call him concerning this matter and states it has to do with medication. Thanks

## 2012-12-14 NOTE — Telephone Encounter (Signed)
Spoke to patient. Sched OV for 12/29/12. Patient agreed.

## 2012-12-21 ENCOUNTER — Encounter: Payer: Self-pay | Admitting: Diagnostic Neuroimaging

## 2012-12-22 ENCOUNTER — Other Ambulatory Visit: Payer: Self-pay | Admitting: Diagnostic Neuroimaging

## 2012-12-22 ENCOUNTER — Other Ambulatory Visit: Payer: Self-pay | Admitting: *Deleted

## 2012-12-22 ENCOUNTER — Telehealth: Payer: Self-pay | Admitting: *Deleted

## 2012-12-22 ENCOUNTER — Other Ambulatory Visit: Payer: Self-pay

## 2012-12-22 DIAGNOSIS — G7000 Myasthenia gravis without (acute) exacerbation: Secondary | ICD-10-CM | POA: Diagnosis not present

## 2012-12-22 MED ORDER — AZATHIOPRINE 50 MG PO TABS: 150.0000 mg | ORAL_TABLET | Freq: Every day | ORAL | Status: DC

## 2012-12-22 NOTE — Telephone Encounter (Signed)
Suanne Marker, MD at 11/06/2012 5:26 PM   Status: Signed            I reviewed patient labs. Labs look good. I called patient and told him to increase Imuran to 150 mg per day. I will see him back in one month for revisit and peak lab testing.   Patient called saying he needs a new Rx with current directions sent to the pharmacy.  Rx has been updated and sent.

## 2012-12-22 NOTE — Addendum Note (Signed)
Addended byHermenia Fiscal on: 12/22/2012 02:39 PM   Modules accepted: Orders

## 2012-12-22 NOTE — Telephone Encounter (Signed)
Pt walked in for labs. Consulted Dr. Marjory Lies, and will need cbc d/p and CMP for appt next week.

## 2012-12-23 LAB — CBC WITH DIFFERENTIAL
Basos: 0 % (ref 0–3)
Eos: 2 % (ref 0–5)
Eosinophils Absolute: 0.1 10*3/uL (ref 0.0–0.4)
Hemoglobin: 13.1 g/dL (ref 12.6–17.7)
Lymphs: 30 % (ref 14–46)
Monocytes: 11 % (ref 4–12)
Neutrophils Relative %: 57 % (ref 40–74)
RBC: 4.13 x10E6/uL — ABNORMAL LOW (ref 4.14–5.80)

## 2012-12-23 LAB — COMPREHENSIVE METABOLIC PANEL
Albumin: 4.1 g/dL (ref 3.5–4.8)
Alkaline Phosphatase: 53 IU/L (ref 39–117)
BUN/Creatinine Ratio: 16 (ref 10–22)
BUN: 20 mg/dL (ref 8–27)
CO2: 27 mmol/L (ref 18–29)
Creatinine, Ser: 1.25 mg/dL (ref 0.76–1.27)
GFR calc Af Amer: 66 mL/min/{1.73_m2} (ref >59)
GFR calc non Af Amer: 57 mL/min/{1.73_m2} — ABNORMAL LOW (ref >59)
Globulin, Total: 2.3 g/dL (ref 1.5–4.5)

## 2012-12-29 ENCOUNTER — Ambulatory Visit (INDEPENDENT_AMBULATORY_CARE_PROVIDER_SITE_OTHER): Payer: Medicare Other | Admitting: Diagnostic Neuroimaging

## 2012-12-29 ENCOUNTER — Encounter: Payer: Self-pay | Admitting: Diagnostic Neuroimaging

## 2012-12-29 VITALS — BP 127/73 | HR 57 | Temp 98.0°F | Ht 69.5 in | Wt 191.0 lb

## 2012-12-29 DIAGNOSIS — Z79899 Other long term (current) drug therapy: Secondary | ICD-10-CM

## 2012-12-29 DIAGNOSIS — G7000 Myasthenia gravis without (acute) exacerbation: Secondary | ICD-10-CM

## 2012-12-29 NOTE — Progress Notes (Signed)
[facility]  PATIENT: Oscar Jacobs DOB: 03/16/1941  REFERRING CLINICIAN:  HISTORY FROM: patient and wife REASON FOR VISIT: follow up   HISTORICAL  CHIEF COMPLAINT:  Chief Complaint  Patient presents with  . Follow-up    Myasthenia gravis    HISTORY OF PRESENT ILLNESS:   UPDATE 12/29/12: Doing well on imuran 150mg  daily. No new sxs. No SOB. No weakness. Working in the yard.  UPDATE 11/06/12: Doing well on imuran 100mg  daily. No MG flares. Left ptosis is better. No mania or mood disturbances. Last labs were improved. Taking mestinon 30mg  TID. Sometimes has diarrhea, and then takes 15mg  dose.  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 He was kindly and expertly managed by medical and neurohospitalists, and tapered off prednisone, treated with IVIG, and then started on imuran. Doing well now. Mania is almost fully resolved. Still a bit "hyper" per wife, but much improved. Platelets trended down near end of hospital stay (last 120). Here for follow up labs as well.  UPDATE 09/10/12: Since last visit, doing much better on prednisone. Now on prednisone 60mg  daily. Taking calcium + Vit D, omeprazole. No side effects, except for slightly "buzzy" mood. Sleep apnea better. Strength improved. Almost back to normal. Some left ptosis persists. Some chewing difficulty towards end of meals.  UPDATE 08/13/12: Since last visit, EMG and lab testing confirm myasthenia gravis diagnosis. I prescribed Mestinon for patient. He has had a positive response. He tried 30 mg 3 times a day, but now takes 15 mg 6 times a day, do to see increasing secretions and diarrhea. Medications lasting approximately 4 hours. No shortness of breath. Continues to have left ptosis, some neck weakness and left arm weakness. Some mild slurred speech. No trouble swallowing.  PRIOR HPI (08/06/12): 72 year old right-handed male with history of  hypertension, seasonal allergies, here for evaluation of possible myasthenia gravis.  6 weeks ago patient noted left eyelid drooping. No double vision. Over the next few weeks he had increasing weakness in keeping his head upright as well as difficulty with talking and chewing. Since this past Saturday symptoms have been significantly worse. He denies any shortness of breath or choking problems. He has mild weakness in his arms and no weakness in his legs. No headache, confusion, chest pain. No similar symptoms in the past.  Patient does have diagnosis of sleep apnea, but he has not used his CPAP machine last 2 or 3 years.  REVIEW OF SYSTEMS: Full 14 system review of systems performed and notable snoring.  ALLERGIES: Allergies  Allergen Reactions  . Prednisone Other (See Comments)    manic  . Penicillins Rash  . Risperidone And Related Other (See Comments)    hyperactivity    HOME MEDICATIONS:  Prior to Admission medications   Medication Sig Start Date End Date Taking? Authorizing Provider  aspirin EC 81 MG tablet Take 81 mg by mouth every morning.   Yes Historical Provider, MD  azaTHIOprine (IMURAN) 50 MG tablet Take 1 tablet (50 mg total) by mouth daily. 10/05/12 10/15/12 Yes Suanne Marker, MD  calcium-vitamin D (OSCAL WITH D) 500-200 MG-UNIT per tablet Take 1 tablet by mouth 2 (two) times daily.   Yes Historical Provider, MD  cetirizine (ZYRTEC) 10 MG tablet Take 10 mg by mouth daily.   Yes Historical Provider, MD  cholecalciferol (VITAMIN D) 1000 UNITS tablet Take 1,000 Units by mouth daily.   Yes Historical Provider, MD  diphenhydrAMINE (  BENADRYL) 25 MG tablet Take 25 mg by mouth at bedtime.   Yes Historical Provider, MD  Multiple Vitamin (MULTIVITAMIN WITH MINERALS) TABS Take 1 tablet by mouth daily.   Yes Historical Provider, MD  omeprazole (PRILOSEC) 20 MG capsule Take 20 mg by mouth every morning.    Yes Historical Provider, MD  pyridostigmine (MESTINON) 60 MG tablet Take 0.5  tablets (30 mg total) by mouth every 8 (eight) hours. 09/29/12  Yes Jerald Kief, MD  ramipril (ALTACE) 10 MG capsule Take 10 mg by mouth daily.   Yes Historical Provider, MD    PAST MEDICAL HISTORY: Past Medical History  Diagnosis Date  . Sleep apnea   . Hypertension   . Squamous carcinoma   . Myasthenia gravis   . GERD (gastroesophageal reflux disease)     PAST SURGICAL HISTORY: Past Surgical History  Procedure Laterality Date  . Skin cancer removal      FAMILY HISTORY: Family History  Problem Relation Age of Onset  . Heart disease Mother   . Cancer - Other Father   . Heart disease Sister   . Heart disease Brother   . Bipolar disorder Sister   . Bipolar disorder Sister     suicide  . Healthy Brother     SOCIAL HISTORY:  History   Social History  . Marital Status: Married    Spouse Name: [name] Sparsh Callens    Number of Children: 3  . Years of Education: MD   Occupational History  . Retired     Radiology MD   Social History Main Topics  . Smoking status: Never Smoker   . Smokeless tobacco: Never Used  . Alcohol Use: Yes     Comment: seldom  . Drug Use: No  . Sexual Activity: Not on file   Other Topics Concern  . Not on file   Social History Narrative   Married.  Ambulates unassisted.   Pt lives at home with his spouse.   Caffeine Use- 4 cups daily.     PHYSICAL EXAM  Filed Vitals:   12/29/12 1340  BP: 127/73  Pulse: 57  Temp: 98 F (36.7 C)  TempSrc: Oral  Height: 5' 9.5" (1.765 m)  Weight: 191 lb (86.637 kg)   Body mass index is 27.81 kg/(m^2).  GENERAL EXAM: Patient is in no distress  CARDIOVASCULAR: Regular rate and rhythm, no murmurs, no carotid bruits  NEUROLOGIC: MENTAL STATUS: awake, alert, language fluent, comprehension intact, naming intact CRANIAL NERVE: pupils equal and reactive to light, visual fields full to confrontation, extraocular muscles intact, no nystagmus, facial sensation and strength symmetric, uvula  midline, shoulder shrug symmetric, tongue midline. SUBTLE LEFT PTOSIS.  MOTOR: 5/5 IN BUE AND BLE.  SENSORY: normal and symmetric to light touch. COORDINATION: finger-nose-finger, fine finger movements normal REFLEXES: deep tendon reflexes present and symmetric; TRACE AT ANKLES. GAIT/STATION: narrow based gait; STOOPED POSTURE.   DIAGNOSTIC DATA (LABS, IMAGING, TESTING) - I reviewed patient records, labs, notes, testing and imaging myself where available.  Lab Results  Component Value Date   WBC 5.6 12/22/2012   HGB 13.1 12/22/2012   HCT 38.0 12/22/2012   MCV 92 12/22/2012   PLT 175 12/22/2012   Lab Results  Component Value Date   CREATININE 1.25 12/22/2012   BUN 20 12/22/2012   NA 142 12/22/2012   K 4.5 12/22/2012   CL 105 12/22/2012   CO2 27 12/22/2012   Lab Results  Component Value Date   ALT 15 12/22/2012  AST 23 12/22/2012   ALKPHOS 53 12/22/2012   BILITOT 0.5 12/22/2012     08/05/12 AchR binding 33.80 (h), AchR blocking 36% (h), AchR modulating 38% (h), anti-striational (1:80 (h)  08/05/12 B12 643, TSH 4.07, FT4 1.1, BUN/Cr 21/1.7  08/06/12 CT chest - no thymoma, small nodules within the thyroid gland are likely benign, left lower lobe pulmonary nodule, Bosniak I cyst of the right kidney is partially imaged, coronary artery calcifications.  08/06/12 EMG/NCS  1. Decremental response on repetitive nerve stimulation study of greater than 30%, consistent with neuromuscular junction disorder such as myasthenia gravis. 2. No evidence of underlying myopathy or motor neuropathy. 3. Decreased motor unit recruitment of the left flexor carpi radialis muscle may indicate underlying cervical radiculopathy (C6, C7), but not confirmed on paraspinal muscle testing or nerve conduction studies.    ASSESSMENT AND PLAN  72 y.o. year old male  has a past medical history of Sleep apnea; Hypertension; Squamous carcinoma; Myasthenia gravis; and GERD (gastroesophageal reflux disease). here with  myasthenia gravis based on history and exam, EMG and lab testing. Did well with mestinon and prednisone until he developed steroid psychosis/mania. Now transitioned to imuran and doing well.  Problem List:  Myasthenia gravis - stable  Steroid psychosis/mania - RESOLVED  Thrombocytopenia - RESOLVED   PLAN: 1. Continue pyridostigmine 30 mg TID 2. Continue imuran 150mg  daily 3. Follow up in 3 months with repeat CBC and CMP    Suanne Marker, MD 12/29/2012, 2:10 PM Certified in Neurology, Neurophysiology and Neuroimaging  [facility] [address], [address] [place], [place] [address] [phone]

## 2012-12-29 NOTE — Patient Instructions (Signed)
Continue current medications. 

## 2013-01-14 DIAGNOSIS — Z125 Encounter for screening for malignant neoplasm of prostate: Secondary | ICD-10-CM | POA: Diagnosis not present

## 2013-01-14 DIAGNOSIS — I10 Essential (primary) hypertension: Secondary | ICD-10-CM | POA: Diagnosis not present

## 2013-01-14 DIAGNOSIS — E785 Hyperlipidemia, unspecified: Secondary | ICD-10-CM | POA: Diagnosis not present

## 2013-01-18 DIAGNOSIS — R3129 Other microscopic hematuria: Secondary | ICD-10-CM | POA: Diagnosis not present

## 2013-01-18 DIAGNOSIS — G7000 Myasthenia gravis without (acute) exacerbation: Secondary | ICD-10-CM | POA: Diagnosis not present

## 2013-01-18 DIAGNOSIS — Z0000 Encounter for general adult medical examination without abnormal findings: Secondary | ICD-10-CM | POA: Diagnosis not present

## 2013-01-18 DIAGNOSIS — E785 Hyperlipidemia, unspecified: Secondary | ICD-10-CM | POA: Diagnosis not present

## 2013-01-18 DIAGNOSIS — I10 Essential (primary) hypertension: Secondary | ICD-10-CM | POA: Diagnosis not present

## 2013-01-18 DIAGNOSIS — Z125 Encounter for screening for malignant neoplasm of prostate: Secondary | ICD-10-CM | POA: Diagnosis not present

## 2013-01-18 DIAGNOSIS — Z1331 Encounter for screening for depression: Secondary | ICD-10-CM | POA: Diagnosis not present

## 2013-01-18 DIAGNOSIS — Z23 Encounter for immunization: Secondary | ICD-10-CM | POA: Diagnosis not present

## 2013-01-18 DIAGNOSIS — Z6826 Body mass index (BMI) 26.0-26.9, adult: Secondary | ICD-10-CM | POA: Diagnosis not present

## 2013-01-18 DIAGNOSIS — G4730 Sleep apnea, unspecified: Secondary | ICD-10-CM | POA: Diagnosis not present

## 2013-01-19 DIAGNOSIS — D485 Neoplasm of uncertain behavior of skin: Secondary | ICD-10-CM | POA: Diagnosis not present

## 2013-01-19 DIAGNOSIS — L439 Lichen planus, unspecified: Secondary | ICD-10-CM | POA: Diagnosis not present

## 2013-01-19 DIAGNOSIS — D0470 Carcinoma in situ of skin of unspecified lower limb, including hip: Secondary | ICD-10-CM | POA: Diagnosis not present

## 2013-01-19 DIAGNOSIS — L821 Other seborrheic keratosis: Secondary | ICD-10-CM | POA: Diagnosis not present

## 2013-01-19 DIAGNOSIS — L570 Actinic keratosis: Secondary | ICD-10-CM | POA: Diagnosis not present

## 2013-01-19 DIAGNOSIS — Z85828 Personal history of other malignant neoplasm of skin: Secondary | ICD-10-CM | POA: Diagnosis not present

## 2013-01-19 DIAGNOSIS — L408 Other psoriasis: Secondary | ICD-10-CM | POA: Diagnosis not present

## 2013-01-22 DIAGNOSIS — Z1212 Encounter for screening for malignant neoplasm of rectum: Secondary | ICD-10-CM | POA: Diagnosis not present

## 2013-02-25 DIAGNOSIS — Z23 Encounter for immunization: Secondary | ICD-10-CM | POA: Diagnosis not present

## 2013-03-04 ENCOUNTER — Other Ambulatory Visit: Payer: Self-pay

## 2013-04-06 ENCOUNTER — Encounter: Payer: Self-pay | Admitting: Diagnostic Neuroimaging

## 2013-04-07 ENCOUNTER — Other Ambulatory Visit (INDEPENDENT_AMBULATORY_CARE_PROVIDER_SITE_OTHER): Payer: Self-pay

## 2013-04-07 DIAGNOSIS — G7000 Myasthenia gravis without (acute) exacerbation: Secondary | ICD-10-CM

## 2013-04-07 DIAGNOSIS — Z0289 Encounter for other administrative examinations: Secondary | ICD-10-CM

## 2013-04-07 NOTE — Telephone Encounter (Signed)
Pt here for labs  

## 2013-04-07 NOTE — Addendum Note (Signed)
Addended byHermenia Fiscal on: 04/07/2013 03:44 PM   Modules accepted: Orders

## 2013-04-08 LAB — COMPREHENSIVE METABOLIC PANEL
Albumin: 3.9 g/dL (ref 3.5–4.8)
Alkaline Phosphatase: 55 IU/L (ref 39–117)
BUN/Creatinine Ratio: 14 (ref 10–22)
BUN: 18 mg/dL (ref 8–27)
Chloride: 104 mmol/L (ref 97–108)
GFR calc Af Amer: 63 mL/min/{1.73_m2} (ref >59)
Globulin, Total: 2.5 g/dL (ref 1.5–4.5)
Total Bilirubin: 0.4 mg/dL (ref 0.0–1.2)

## 2013-04-08 LAB — CBC WITH DIFFERENTIAL
Basophils Absolute: 0 10*3/uL (ref 0.0–0.2)
Eosinophils Absolute: 0.2 10*3/uL (ref 0.0–0.4)
Immature Granulocytes: 0 %
Lymphocytes Absolute: 1.6 10*3/uL (ref 0.7–3.1)
MCH: 33.2 pg — ABNORMAL HIGH (ref 26.6–33.0)
MCHC: 34.8 g/dL (ref 31.5–35.7)
MCV: 96 fL (ref 79–97)
Monocytes Absolute: 0.3 10*3/uL (ref 0.1–0.9)
Platelets: 164 10*3/uL (ref 150–379)
RDW: 15.8 % — ABNORMAL HIGH (ref 12.3–15.4)
WBC: 5.6 10*3/uL (ref 3.4–10.8)

## 2013-04-09 ENCOUNTER — Other Ambulatory Visit: Payer: Self-pay | Admitting: Diagnostic Neuroimaging

## 2013-04-09 ENCOUNTER — Ambulatory Visit (INDEPENDENT_AMBULATORY_CARE_PROVIDER_SITE_OTHER): Payer: Medicare Other | Admitting: Diagnostic Neuroimaging

## 2013-04-09 ENCOUNTER — Encounter: Payer: Self-pay | Admitting: Diagnostic Neuroimaging

## 2013-04-09 ENCOUNTER — Telehealth: Payer: Self-pay

## 2013-04-09 VITALS — BP 147/80 | HR 66 | Temp 98.6°F | Ht 69.5 in | Wt 196.0 lb

## 2013-04-09 DIAGNOSIS — Z7960 Long term (current) use of unspecified immunomodulators and immunosuppressants: Secondary | ICD-10-CM

## 2013-04-09 DIAGNOSIS — G7000 Myasthenia gravis without (acute) exacerbation: Secondary | ICD-10-CM

## 2013-04-09 DIAGNOSIS — Z79899 Other long term (current) drug therapy: Secondary | ICD-10-CM | POA: Diagnosis not present

## 2013-04-09 DIAGNOSIS — G4733 Obstructive sleep apnea (adult) (pediatric): Secondary | ICD-10-CM | POA: Insufficient documentation

## 2013-04-09 MED ORDER — PYRIDOSTIGMINE BROMIDE 60 MG PO TABS: 30.0000 mg | ORAL_TABLET | Freq: Three times a day (TID) | ORAL | Status: DC

## 2013-04-09 MED ORDER — AZATHIOPRINE 50 MG PO TABS: 150.0000 mg | ORAL_TABLET | Freq: Every day | ORAL | Status: DC

## 2013-04-09 NOTE — Telephone Encounter (Signed)
Called patient. Scheduled sleep consult visit per request.

## 2013-04-09 NOTE — Patient Instructions (Signed)
Repeat blood testing in 3 months.  Follow up in 4 months.

## 2013-04-09 NOTE — Progress Notes (Signed)
[facility]  PATIENT: Oscar Jacobs DOB: Jun 29, 1940  REFERRING CLINICIAN:  HISTORY FROM: patient and wife REASON FOR VISIT: follow up   HISTORICAL  CHIEF COMPLAINT:  Chief Complaint  Patient presents with  . Follow-up    HISTORY OF PRESENT ILLNESS:   UPDATE 04/09/13: Continues to do well on imuran 150mg  daily. No side effects. Has been to PCP (Dr. Waynard Edwards) for annual physical; PSA slightly elevated and has follow up. Patient has gained some weight (he thinks due to inactivity). Otherwise no concerns. No SOB, weakness, numbness.  UPDATE 12/29/12: Doing well on imuran 150mg  daily. No new sxs. No SOB. No weakness. Working in the yard.  UPDATE 11/06/12: Doing well on imuran 100mg  daily. No MG flares. Left ptosis is better. No mania or mood disturbances. Last labs were improved. Taking mestinon 30mg  TID. Sometimes has diarrhea, and then takes 15mg  dose.  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 He was kindly and expertly managed by medical and neurohospitalists, and tapered off prednisone, treated with IVIG, and then started on imuran. Doing well now. Mania is almost fully resolved. Still a bit "hyper" per wife, but much improved. Platelets trended down near end of hospital stay (last 120). Here for follow up labs as well.  UPDATE 09/10/12: Since last visit, doing much better on prednisone. Now on prednisone 60mg  daily. Taking calcium + Vit D, omeprazole. No side effects, except for slightly "buzzy" mood. Sleep apnea better. Strength improved. Almost back to normal. Some left ptosis persists. Some chewing difficulty towards end of meals.  UPDATE 08/13/12: Since last visit, EMG and lab testing confirm myasthenia gravis diagnosis. I prescribed Mestinon for patient. He has had a positive response. He tried 30 mg 3 times a day, but now takes 15 mg 6 times a day, do to see increasing secretions and  diarrhea. Medications lasting approximately 4 hours. No shortness of breath. Continues to have left ptosis, some neck weakness and left arm weakness. Some mild slurred speech. No trouble swallowing.  PRIOR HPI (08/06/12): 72 year old right-handed male with history of hypertension, seasonal allergies, here for evaluation of possible myasthenia gravis.  6 weeks ago patient noted left eyelid drooping. No double vision. Over the next few weeks he had increasing weakness in keeping his head upright as well as difficulty with talking and chewing. Since this past Saturday symptoms have been significantly worse. He denies any shortness of breath or choking problems. He has mild weakness in his arms and no weakness in his legs. No headache, confusion, chest pain. No similar symptoms in the past.  Patient does have diagnosis of sleep apnea, but he has not used his CPAP machine last 2 or 3 years.  REVIEW OF SYSTEMS: Full 14 system review of systems performed and notable snoring sleep apnea, runny nose.  ALLERGIES: Allergies  Allergen Reactions  . Prednisone Other (See Comments)    manic  . Penicillins Rash  . Risperidone And Related Other (See Comments)    hyperactivity    HOME MEDICATIONS:  Outpatient Encounter Prescriptions as of 04/09/2013  Medication Sig  . aspirin EC 81 MG tablet Take 81 mg by mouth every morning.  Marland Kitchen azaTHIOprine (IMURAN) 50 MG tablet Take 3 tablets (150 mg total) by mouth daily.  . calcium-vitamin D (OSCAL WITH D) 500-200 MG-UNIT per tablet Take 1 tablet by mouth 2 (two) times daily.  . cetirizine (ZYRTEC) 10 MG tablet Take 10 mg by mouth daily.  Marland Kitchen  cholecalciferol (VITAMIN D) 1000 UNITS tablet Take 500 Units by mouth daily.   . diphenhydrAMINE (BENADRYL) 25 MG tablet Take 25 mg by mouth at bedtime.  . Multiple Vitamin (MULTIVITAMIN WITH MINERALS) TABS Take 1 tablet by mouth daily.  Marland Kitchen omeprazole (PRILOSEC) 20 MG capsule Take 20 mg by mouth every morning.   . pyridostigmine  (MESTINON) 60 MG tablet Take 0.5 tablets (30 mg total) by mouth every 8 (eight) hours.  . ramipril (ALTACE) 10 MG capsule Take 10 mg by mouth daily.  . [DISCONTINUED] azaTHIOprine (IMURAN) 50 MG tablet Take 3 tablets (150 mg total) by mouth daily.  . [DISCONTINUED] pyridostigmine (MESTINON) 60 MG tablet Take 0.5 tablets (30 mg total) by mouth every 8 (eight) hours.     PAST MEDICAL HISTORY: Past Medical History  Diagnosis Date  . Sleep apnea   . Hypertension   . Squamous carcinoma   . Myasthenia gravis   . GERD (gastroesophageal reflux disease)     PAST SURGICAL HISTORY: Past Surgical History  Procedure Laterality Date  . Skin cancer removal      FAMILY HISTORY: Family History  Problem Relation Age of Onset  . Heart disease Mother   . Cancer - Other Father   . Heart disease Sister   . Heart disease Brother   . Bipolar disorder Sister   . Bipolar disorder Sister     suicide  . Healthy Brother     SOCIAL HISTORY:  History   Social History  . Marital Status: Married    Spouse Name: [name] Equan Cogbill    Number of Children: 3  . Years of Education: MD   Occupational History  . Retired     Radiology MD   Social History Main Topics  . Smoking status: Never Smoker   . Smokeless tobacco: Never Used  . Alcohol Use: Yes     Comment: seldom  . Drug Use: No  . Sexual Activity: Not on file   Other Topics Concern  . Not on file   Social History Narrative   Married.  Ambulates unassisted.   Pt lives at home with his spouse.   Caffeine Use- 4 cups daily.     PHYSICAL EXAM  Filed Vitals:   04/09/13 0838  BP: 147/80  Pulse: 66  Temp: 98.6 F (37 C)  TempSrc: Oral  Height: 5' 9.5" (1.765 m)  Weight: 196 lb (88.905 kg)   Body mass index is 28.54 kg/(m^2).  GENERAL EXAM: Patient is in no distress  CARDIOVASCULAR: Regular rate and rhythm, no murmurs, no carotid bruits  NEUROLOGIC: MENTAL STATUS: awake, alert, language fluent, comprehension intact,  naming intact CRANIAL NERVE: pupils equal and reactive to light, visual fields full to confrontation, extraocular muscles intact, no nystagmus, facial sensation and strength symmetric, uvula midline, shoulder shrug symmetric, tongue midline. SUBTLE LEFT PTOSIS. NO DIPLOPIA OR WORSENING PTOSIS AFTER 30 SEC UPGAZE. ABLE TO COUNT TO 27 IN 1 BREATH. MOTOR: 5/5 IN BUE AND BLE.  SENSORY: normal and symmetric to light touch. COORDINATION: finger-nose-finger, fine finger movements normal REFLEXES: deep tendon reflexes present and symmetric; TRACE AT ANKLES. GAIT/STATION: narrow based gait; STOOPED POSTURE.   DIAGNOSTIC DATA (LABS, IMAGING, TESTING) - I reviewed patient records, labs, notes, testing and imaging myself where available.  Lab Results  Component Value Date   WBC 5.6 04/07/2013   HGB 12.8 04/07/2013   HCT 36.8* 04/07/2013   MCV 96 04/07/2013   PLT 164 04/07/2013   Lab Results  Component Value Date  CREATININE 1.30* 04/07/2013   BUN 18 04/07/2013   NA 143 04/07/2013   K 4.2 04/07/2013   CL 104 04/07/2013   CO2 23 04/07/2013   Lab Results  Component Value Date   ALT 21 04/07/2013   AST 23 04/07/2013   ALKPHOS 55 04/07/2013   BILITOT 0.4 04/07/2013     08/05/12 AchR binding 33.80 (h), AchR blocking 36% (h), AchR modulating 38% (h), anti-striational (1:80 (h)  08/05/12 B12 643, TSH 4.07, FT4 1.1, BUN/Cr 21/1.7  08/06/12 CT chest - no thymoma, small nodules within the thyroid gland are likely benign, left lower lobe pulmonary nodule, Bosniak I cyst of the right kidney is partially imaged, coronary artery calcifications.  08/06/12 EMG/NCS  1. Decremental response on repetitive nerve stimulation study of greater than 30%, consistent with neuromuscular junction disorder such as myasthenia gravis. 2. No evidence of underlying myopathy or motor neuropathy. 3. Decreased motor unit recruitment of the left flexor carpi radialis muscle may indicate underlying cervical radiculopathy  (C6, C7), but not confirmed on paraspinal muscle testing or nerve conduction studies.    ASSESSMENT AND PLAN  72 y.o. year old male  has a past medical history of Sleep apnea; Hypertension; Squamous carcinoma; Myasthenia gravis; and GERD (gastroesophageal reflux disease). here with myasthenia gravis based on history and exam, EMG and lab testing. Did well with mestinon and prednisone until he developed steroid psychosis/mania. Now transitioned to imuran and doing well.  Problem List:  Myasthenia gravis - stable  Steroid psychosis/mania - RESOLVED  Thrombocytopenia - RESOLVED   PLAN: 1. Continue pyridostigmine 30 mg TID 2. Continue imuran 150mg  daily 3. Continue CBC, CMP monitoring (for imuran) q105months  Return in about 4 months (around 08/08/2013).   Suanne Marker, MD 04/09/2013, 9:19 AM Certified in Neurology, Neurophysiology and Neuroimaging  [facility] [address] [place], [place] [address] [phone]

## 2013-04-30 ENCOUNTER — Ambulatory Visit: Payer: Self-pay | Admitting: Neurology

## 2013-07-16 DIAGNOSIS — R972 Elevated prostate specific antigen [PSA]: Secondary | ICD-10-CM | POA: Diagnosis not present

## 2013-07-19 DIAGNOSIS — G7000 Myasthenia gravis without (acute) exacerbation: Secondary | ICD-10-CM | POA: Diagnosis not present

## 2013-07-19 DIAGNOSIS — Z6827 Body mass index (BMI) 27.0-27.9, adult: Secondary | ICD-10-CM | POA: Diagnosis not present

## 2013-07-19 DIAGNOSIS — R972 Elevated prostate specific antigen [PSA]: Secondary | ICD-10-CM | POA: Diagnosis not present

## 2013-07-19 DIAGNOSIS — G4730 Sleep apnea, unspecified: Secondary | ICD-10-CM | POA: Diagnosis not present

## 2013-07-19 DIAGNOSIS — I10 Essential (primary) hypertension: Secondary | ICD-10-CM | POA: Diagnosis not present

## 2013-07-27 ENCOUNTER — Other Ambulatory Visit: Payer: Self-pay

## 2013-07-27 ENCOUNTER — Encounter: Payer: Self-pay | Admitting: Diagnostic Neuroimaging

## 2013-07-27 MED ORDER — AZATHIOPRINE 50 MG PO TABS: 150.0000 mg | ORAL_TABLET | Freq: Every day | ORAL | Status: DC

## 2013-08-12 DIAGNOSIS — R972 Elevated prostate specific antigen [PSA]: Secondary | ICD-10-CM | POA: Diagnosis not present

## 2013-08-12 DIAGNOSIS — N400 Enlarged prostate without lower urinary tract symptoms: Secondary | ICD-10-CM | POA: Diagnosis not present

## 2013-11-19 ENCOUNTER — Encounter: Payer: Self-pay | Admitting: Neurology

## 2013-11-19 ENCOUNTER — Ambulatory Visit (INDEPENDENT_AMBULATORY_CARE_PROVIDER_SITE_OTHER): Payer: Medicare Other | Admitting: Neurology

## 2013-11-19 VITALS — BP 124/73 | HR 68 | Ht 70.5 in | Wt 191.0 lb

## 2013-11-19 DIAGNOSIS — R0609 Other forms of dyspnea: Secondary | ICD-10-CM | POA: Diagnosis not present

## 2013-11-19 DIAGNOSIS — R0683 Snoring: Secondary | ICD-10-CM

## 2013-11-19 DIAGNOSIS — R0989 Other specified symptoms and signs involving the circulatory and respiratory systems: Secondary | ICD-10-CM | POA: Diagnosis not present

## 2013-11-19 DIAGNOSIS — G4733 Obstructive sleep apnea (adult) (pediatric): Secondary | ICD-10-CM | POA: Diagnosis not present

## 2013-11-19 DIAGNOSIS — G7000 Myasthenia gravis without (acute) exacerbation: Secondary | ICD-10-CM | POA: Diagnosis not present

## 2013-11-19 DIAGNOSIS — G4700 Insomnia, unspecified: Secondary | ICD-10-CM | POA: Diagnosis not present

## 2013-11-19 NOTE — Patient Instructions (Signed)
Sleep Apnea  Sleep apnea is a sleep disorder characterized by abnormal pauses in breathing while you sleep. When your breathing pauses, the level of oxygen in your blood decreases. This causes you to move out of deep sleep and into light sleep. As a result, your quality of sleep is poor, and the system that carries your blood throughout your body (cardiovascular system) experiences stress. If sleep apnea remains untreated, the following conditions can develop:  High blood pressure (hypertension).  Coronary artery disease.  Inability to achieve or maintain an erection (impotence).  Impairment of your thought process (cognitive dysfunction). There are three types of sleep apnea: 1. Obstructive sleep apnea--Pauses in breathing during sleep because of a blocked airway. 2. Central sleep apnea--Pauses in breathing during sleep because the area of the brain that controls your breathing does not send the correct signals to the muscles that control breathing. 3. Mixed sleep apnea--A combination of both obstructive and central sleep apnea. RISK FACTORS The following risk factors can increase your risk of developing sleep apnea:  Being overweight.  Smoking.  Having narrow passages in your nose and throat.  Being of older age.  Being male.  Alcohol use.  Sedative and tranquilizer use.  Ethnicity. Among individuals younger than 35 years, African Americans are at increased risk of sleep apnea. SYMPTOMS   Difficulty staying asleep.  Daytime sleepiness and fatigue.  Loss of energy.  Irritability.  Loud, heavy snoring.  Morning headaches.  Trouble concentrating.  Forgetfulness.  Decreased interest in sex. DIAGNOSIS  In order to diagnose sleep apnea, your caregiver will perform a physical examination. Your caregiver may suggest that you take a home sleep test. Your caregiver may also recommend that you spend the night in a sleep lab. In the sleep lab, several monitors record  information about your heart, lungs, and brain while you sleep. Your leg and arm movements and blood oxygen level are also recorded. TREATMENT The following actions may help to resolve mild sleep apnea:  Sleeping on your side.   Using a decongestant if you have nasal congestion.   Avoiding the use of depressants, including alcohol, sedatives, and narcotics.   Losing weight and modifying your diet if you are overweight. There also are devices and treatments to help open your airway:  Oral appliances. These are custom-made mouthpieces that shift your lower jaw forward and slightly open your bite. This opens your airway.  Devices that create positive airway pressure. This positive pressure "splints" your airway open to help you breathe better during sleep. The following devices create positive airway pressure:  Continuous positive airway pressure (CPAP) device. The CPAP device creates a continuous level of air pressure with an air pump. The air is delivered to your airway through a mask while you sleep. This continuous pressure keeps your airway open.  Nasal expiratory positive airway pressure (EPAP) device. The EPAP device creates positive air pressure as you exhale. The device consists of single-use valves, which are inserted into each nostril and held in place by adhesive. The valves create very little resistance when you inhale but create much more resistance when you exhale. That increased resistance creates the positive airway pressure. This positive pressure while you exhale keeps your airway open, making it easier to breath when you inhale again.  Bilevel positive airway pressure (BPAP) device. The BPAP device is used mainly in patients with central sleep apnea. This device is similar to the CPAP device because it also uses an air pump to deliver continuous air pressure   through a mask. However, with the BPAP machine, the pressure is set at two different levels. The pressure when you  exhale is lower than the pressure when you inhale.  Surgery. Typically, surgery is only done if you cannot comply with less invasive treatments or if the less invasive treatments do not improve your condition. Surgery involves removing excess tissue in your airway to create a wider passage way. Document Released: 04/05/2002 Document Revised: 08/10/2012 Document Reviewed: 08/22/2011 ExitCare Patient Information 2015 [employer]. This information is not intended to replace advice given to you by your health care provider. Make sure you discuss any questions you have with your health care provider.  

## 2013-11-19 NOTE — Progress Notes (Signed)
[facility]  PATIENT: Oscar Jacobs DOB: 01/29/1941  REFERRING CLINICIAN:  HISTORY FROM: patient and wife REASON FOR VISIT: follow up   HISTORICAL  CHIEF COMPLAINT:  Chief Complaint  Patient presents with  . New Evaluation    Room 11  . 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  He was diagnosed with an AHI of 16 in 12-1 2012 .He has meanwhile developed myasthenia and treated by [name]. He hadn't used CPAP in several years , felt "well enough ", but just a couple of days ago restarted using it.  He brought the machine with him  today.  He has a severe residual AHi of 15 and he has noted severe air leaks. I would like for him to undergo a retest to see if apnea is still present, and if so, if treatment is indicated.  His myasthenia can certainly contribute to respiratory difficulties.  He takes antihistamines to help him sleep, and finds himself more drowsy in AM, CPAP helped this , and he had less rhinitis while using it.  His usual bedtime is around 11 PM, and he falls asleep quickly. He wakes once or twice for bathroom break, but usually sleeps through otherwise.His wife has reported that he is snoring again, she has also witnessed apneas which have concerned her.  The seems to be a strong positional component to snoring and apnea in this patient. He rises in the morning around 6:30 AM, he wakes up spontaneously and does not need another arm. He feels usually refreshed , has 2 coffees and has no urge to nap. If he is unstimulated , he drifts off, especially when the TV is boring. He usually sits in a lounge chair while napping. He has at least 7 hours of sleep.  [name] Jacobs  the geriatric depression score at one point, the fatigue severity score at 37 points and the for a score at 8 points.    [place]  [place]. Pesticides were used, he was exposed to  arsenic and tobacco gum.  His daughters work as a Chief Executive Officer in [place], [place] and one Lexicographer in [place] and one is a Warden/ranger in [place] . His wife is a Industrial/product designer in [place].  His mother died at age 20, her sister at 23 and her mother at 102 years of age.        Previous notes :   UPDATE 04/09/13: Continues to do well on imuran 150mg  daily. No side effects. Has been to PCP (Oscar Jacobs) for annual physical; PSA slightly elevated and has follow up. Patient has gained some weight (he thinks due to inactivity). Otherwise no concerns. No SOB, weakness, numbness.  UPDATE 09/10/12: Since last visit, doing much better on prednisone. Now on prednisone 60mg  daily. Taking calcium + Vit D, omeprazole. No side effects, except for slightly "buzzy" mood. Sleep apnea better. Strength improved. Almost back to normal. Some left ptosis persists. Some chewing difficulty towards end of meals.  UPDATE 08/13/12: Since last visit, EMG and lab testing confirm myasthenia gravis diagnosis. I prescribed Mestinon for patient. He has had a positive response. He tried 30 mg 3 times a day, but now takes 15 mg 6 times a day, do to see increasing secretions and diarrhea. Medications lasting approximately 4 hours. No shortness of breath. Continues to have left ptosis, some neck  weakness and left arm weakness. Some mild slurred speech. No trouble swallowing. Patient does have diagnosis of sleep apnea, but he has not used his CPAP machine last 2 or 3 years.  REVIEW OF SYSTEMS: Full 14 system review of systems performed and notable :  snoring , sleep apnea, runny nose.  ALLERGIES: Allergies  Allergen Reactions  . Prednisone Other (See Comments)    manic  . Penicillins Rash  . Risperidone And Related Other (See Comments)    hyperactivity    HOME MEDICATIONS:  Outpatient Encounter Prescriptions as of 11/19/2013  Medication Sig  . aspirin EC 81 MG tablet Take 81 mg by mouth every morning.  Marland Kitchen  azaTHIOprine (IMURAN) 50 MG tablet Take 3 tablets (150 mg total) by mouth daily.  . calcium-vitamin D (OSCAL WITH D) 500-200 MG-UNIT per tablet Take 1 tablet by mouth 2 (two) times daily.  . cetirizine (ZYRTEC) 10 MG tablet Take 10 mg by mouth daily.  . cholecalciferol (VITAMIN D) 1000 UNITS tablet Take 500 Units by mouth daily.   . diphenhydrAMINE (BENADRYL) 25 MG tablet Take 25 mg by mouth at bedtime.  . Multiple Vitamin (MULTIVITAMIN WITH MINERALS) TABS Take 1 tablet by mouth daily.  Marland Kitchen omeprazole (PRILOSEC) 20 MG capsule Take 20 mg by mouth every morning.   . pyridostigmine (MESTINON) 60 MG tablet Take 0.5 tablets (30 mg total) by mouth every 8 (eight) hours.  . ramipril (ALTACE) 10 MG capsule Take 10 mg by mouth daily.     PAST MEDICAL HISTORY: Past Medical History  Diagnosis Date  . Sleep apnea   . Hypertension   . Squamous carcinoma   . Myasthenia gravis   . GERD (gastroesophageal reflux disease)     PAST SURGICAL HISTORY: Past Surgical History  Procedure Laterality Date  . Skin cancer removal      FAMILY HISTORY: Family History  Problem Relation Age of Onset  . Heart disease Mother   . Cancer - Other Father   . Heart disease Sister   . Heart disease Brother   . Bipolar disorder Sister   . Bipolar disorder Sister     suicide  . Healthy Brother     SOCIAL HISTORY:  History   Social History  . Marital Status: Married    Spouse Name: [name] Amarian Botero    Number of Children: 3  . Years of Education: MD   Occupational History  . Retired     Radiology MD   Social History Main Topics  . Smoking status: Never Smoker   . Smokeless tobacco: Never Used  . Alcohol Use: Yes     Comment: seldom  . Drug Use: No  . Sexual Activity: Not on file   Other Topics Concern  . Not on file   Social History Narrative   Patient is married ([name]) and lives at home with his wife.   Patient has three adult children.   Patient is retired, Stage manager.   Married.   Ambulates unassisted.   Caffeine Use- 4 cups daily.   Patient is right-handed.   Patient has a college education.     PHYSICAL EXAM  Filed Vitals:   11/19/13 1016  BP: 124/73  Pulse: 68  Height: 5' 10.5" (1.791 m)  Weight: 191 lb (86.637 kg)   Body mass index is 27.01 kg/(m^2).  GENERAL EXAM: Patient is in no distress  CARDIOVASCULAR: Regular rate and rhythm, no murmurs, no carotid bruits  NEUROLOGIC:  Neck circumference 17 inches, mallompati 4 , retrognathia,  no click at TMJ.   MENTAL STATUS: awake, alert, language fluent, comprehension intact, naming intact CRANIAL NERVE: pupils equal and reactive to light, visual fields full to confrontation, extraocular muscles intact, no nystagmus, facial sensation and strength symmetric, uvula midline, shoulder shrug symmetric, tongue midline. SUBTLE BILATERAL PTOSIS.   MOTOR: 5/5 IN BUE AND BLE.  SENSORY: normal and symmetric to light touch. COORDINATION: finger-nose-finger, fine finger movements normal REFLEXES: deep tendon reflexes present and symmetric; TRACE AT ANKLES. GAIT/STATION: narrow based gait; STOOPED POSTURE.   DIAGNOSTIC DATA (LABS, IMAGING, TESTING) - I reviewed patient records, labs, notes, testing and imaging myself where available. 08/05/12 AchR binding 33.80 (h), AchR blocking 36% (h), AchR modulating 38% (h), anti-striational (1:80 (h)  08/05/12 B12 643, TSH 4.07, FT4 1.1, BUN/Cr 21/1.7  08/06/12 CT chest - no thymoma, small nodules within the thyroid gland are likely benign, left lower lobe pulmonary nodule, Bosniak I cyst of the right kidney is partially imaged, coronary artery calcifications.  08/06/12 EMG/NCS  1. Decremental response on repetitive nerve stimulation study of greater than 30%, consistent with neuromuscular junction disorder such as myasthenia gravis. 2. No evidence of underlying myopathy or motor neuropathy. 3. Decreased motor unit recruitment of the left flexor carpi radialis muscle may  indicate underlying cervical radiculopathy (C6, C7), but not confirmed on paraspinal muscle testing or nerve conduction studies.    ASSESSMENT AND PLAN  73 y.o. year old male  has a past medical history of Sleep apnea; Hypertension; Squamous carcinoma; Myasthenia gravis; and GERD (gastroesophageal reflux disease). here with untreated OSA- if may have exacerbated since 2007 . based on history and exam, EMG and lab testing. Did well with mestinon and prednisone until he developed steroid psychosis/mania. Based on today's exam and description of symptoms I would suggest to repeat a sleep study to confirm if apnea is still present if his apnea is mild ,   I would  imagine that a dental device could be enough corrected especially is the major compartment is not associated with oxygen desaturations. The patient has slight retrognathia ,  which could be corrected and can be a cause of his snoring. He should reduce the Doxylamine intake , or advance the time of intake by one hour to eliminate any morning grogginess.   [place] [address], [place], [place] [address] [phone]

## 2013-11-24 ENCOUNTER — Ambulatory Visit (INDEPENDENT_AMBULATORY_CARE_PROVIDER_SITE_OTHER): Payer: Medicare Other | Admitting: Neurology

## 2013-11-24 DIAGNOSIS — G7000 Myasthenia gravis without (acute) exacerbation: Secondary | ICD-10-CM

## 2013-11-24 DIAGNOSIS — R0683 Snoring: Secondary | ICD-10-CM

## 2013-11-24 DIAGNOSIS — G4733 Obstructive sleep apnea (adult) (pediatric): Secondary | ICD-10-CM | POA: Diagnosis not present

## 2013-11-24 DIAGNOSIS — G4700 Insomnia, unspecified: Secondary | ICD-10-CM

## 2013-12-15 ENCOUNTER — Telehealth: Payer: Self-pay | Admitting: Neurology

## 2013-12-15 ENCOUNTER — Other Ambulatory Visit: Payer: Self-pay | Admitting: *Deleted

## 2013-12-15 DIAGNOSIS — G4733 Obstructive sleep apnea (adult) (pediatric): Secondary | ICD-10-CM

## 2013-12-23 NOTE — Telephone Encounter (Signed)
Patient was provided his sleep study results by Oscar Jacobs.  He understands diagnosis of osa and is aware that order to begin CPAP therapy has been sent to [facility] for processing.  The referring provider will receive a copy of this report.  The patient will receive the sleep study test results via mail.

## 2014-01-17 DIAGNOSIS — I10 Essential (primary) hypertension: Secondary | ICD-10-CM | POA: Diagnosis not present

## 2014-01-17 DIAGNOSIS — E785 Hyperlipidemia, unspecified: Secondary | ICD-10-CM | POA: Diagnosis not present

## 2014-01-17 DIAGNOSIS — Z125 Encounter for screening for malignant neoplasm of prostate: Secondary | ICD-10-CM | POA: Diagnosis not present

## 2014-01-18 DIAGNOSIS — L439 Lichen planus, unspecified: Secondary | ICD-10-CM | POA: Diagnosis not present

## 2014-01-18 DIAGNOSIS — L821 Other seborrheic keratosis: Secondary | ICD-10-CM | POA: Diagnosis not present

## 2014-01-18 DIAGNOSIS — L570 Actinic keratosis: Secondary | ICD-10-CM | POA: Diagnosis not present

## 2014-01-18 DIAGNOSIS — D0470 Carcinoma in situ of skin of unspecified lower limb, including hip: Secondary | ICD-10-CM | POA: Diagnosis not present

## 2014-01-18 DIAGNOSIS — L259 Unspecified contact dermatitis, unspecified cause: Secondary | ICD-10-CM | POA: Diagnosis not present

## 2014-01-18 DIAGNOSIS — Z85828 Personal history of other malignant neoplasm of skin: Secondary | ICD-10-CM | POA: Diagnosis not present

## 2014-01-24 DIAGNOSIS — E785 Hyperlipidemia, unspecified: Secondary | ICD-10-CM | POA: Diagnosis not present

## 2014-01-24 DIAGNOSIS — K219 Gastro-esophageal reflux disease without esophagitis: Secondary | ICD-10-CM | POA: Diagnosis not present

## 2014-01-24 DIAGNOSIS — Z1331 Encounter for screening for depression: Secondary | ICD-10-CM | POA: Diagnosis not present

## 2014-01-24 DIAGNOSIS — D126 Benign neoplasm of colon, unspecified: Secondary | ICD-10-CM | POA: Diagnosis not present

## 2014-01-24 DIAGNOSIS — Z23 Encounter for immunization: Secondary | ICD-10-CM | POA: Diagnosis not present

## 2014-01-24 DIAGNOSIS — I10 Essential (primary) hypertension: Secondary | ICD-10-CM | POA: Diagnosis not present

## 2014-01-24 DIAGNOSIS — G7000 Myasthenia gravis without (acute) exacerbation: Secondary | ICD-10-CM | POA: Diagnosis not present

## 2014-01-24 DIAGNOSIS — I499 Cardiac arrhythmia, unspecified: Secondary | ICD-10-CM | POA: Diagnosis not present

## 2014-01-24 DIAGNOSIS — Z0000 Encounter for general adult medical examination without abnormal findings: Secondary | ICD-10-CM | POA: Diagnosis not present

## 2014-01-24 DIAGNOSIS — R972 Elevated prostate specific antigen [PSA]: Secondary | ICD-10-CM | POA: Diagnosis not present

## 2014-01-26 DIAGNOSIS — D0470 Carcinoma in situ of skin of unspecified lower limb, including hip: Secondary | ICD-10-CM | POA: Diagnosis not present

## 2014-01-26 DIAGNOSIS — Z85828 Personal history of other malignant neoplasm of skin: Secondary | ICD-10-CM | POA: Diagnosis not present

## 2014-02-07 ENCOUNTER — Ambulatory Visit (INDEPENDENT_AMBULATORY_CARE_PROVIDER_SITE_OTHER): Payer: Medicare Other | Admitting: Diagnostic Neuroimaging

## 2014-02-07 ENCOUNTER — Encounter: Payer: Self-pay | Admitting: Diagnostic Neuroimaging

## 2014-02-07 VITALS — BP 136/81 | HR 62 | Ht 70.0 in | Wt 197.8 lb

## 2014-02-07 DIAGNOSIS — G7000 Myasthenia gravis without (acute) exacerbation: Secondary | ICD-10-CM

## 2014-02-07 NOTE — Progress Notes (Signed)
[facility]  PATIENT: Oscar Jacobs DOB: 1940/07/03  REFERRING CLINICIAN:  HISTORY FROM: patient and wife REASON FOR VISIT: follow up   HISTORICAL  CHIEF COMPLAINT:  No chief complaint on file.   HISTORY OF PRESENT ILLNESS:   UPDATE 02/07/14: Since last visit, MG is stable. No SOB, weakness, slurred speech or trouble swallowing. Has skin CA removed on leg (recurrent from 3 years ago). Has annual dermatology eval. Tolerating imuran 150mg  daily + mestinon 30mg  TID. Doing better with OSA and CPAP.  UPDATE 04/09/13: Continues to do well on imuran 150mg  daily. No side effects. Has been to PCP (Dr. Joylene Jacobs) for annual physical; PSA slightly elevated and has follow up. Patient has gained some weight (he thinks due to inactivity). Otherwise no concerns. No SOB, weakness, numbness.  UPDATE 12/29/12: Doing well on imuran 150mg  daily. No new sxs. No SOB. No weakness. Working in the yard.  UPDATE 11/06/12: Doing well on imuran 100mg  daily. No MG flares. Left ptosis is better. No mania or mood disturbances. Last labs were improved. Taking mestinon 30mg  TID. Sometimes has diarrhea, and then takes 15mg  dose.  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 He was kindly and expertly managed by medical and neurohospitalists, and tapered off prednisone, treated with IVIG, and then started on imuran. Doing well now. Mania is almost fully resolved. Still a bit "hyper" per wife, but much improved. Platelets trended down near end of hospital stay (last 120). Here for follow up labs as well.  UPDATE 09/10/12: Since last visit, doing much better on prednisone. Now on prednisone 60mg  daily. Taking calcium + Vit D, omeprazole. No side effects, except for slightly "buzzy" mood. Sleep apnea better. Strength improved. Almost back to normal. Some left ptosis persists. Some chewing difficulty towards end of meals.  UPDATE  08/13/12: Since last visit, EMG and lab testing confirm myasthenia gravis diagnosis. I prescribed Mestinon for patient. He has had a positive response. He tried 30 mg 3 times a day, but now takes 15 mg 6 times a day, do to see increasing secretions and diarrhea. Medications lasting approximately 4 hours. No shortness of breath. Continues to have left ptosis, some neck weakness and left arm weakness. Some mild slurred speech. No trouble swallowing.  PRIOR HPI (08/06/12): 73 year old right-handed male with history of hypertension, seasonal allergies, here for evaluation of possible myasthenia gravis. 6 weeks ago patient noted left eyelid drooping. No double vision. Over the next few weeks he had increasing weakness in keeping his head upright as well as difficulty with talking and chewing. Since this past Saturday symptoms have been significantly worse. He denies any shortness of breath or choking problems. He has mild weakness in his arms and no weakness in his legs. No headache, confusion, chest pain. No similar symptoms in the past. Patient does have diagnosis of sleep apnea, but he has not used his CPAP machine last 2 or 3 years.  REVIEW OF SYSTEMS: Full 14 system review of systems performed and notable for runny nose, light sens improved apnea snoring env allergires skin wound (post-surgical).   ALLERGIES: Allergies  Allergen Reactions  . Prednisone Other (See Comments)    manic  . Penicillins Rash  . Risperidone And Related Other (See Comments)    hyperactivity    HOME MEDICATIONS:  Outpatient Encounter Prescriptions as of 02/07/2014  Medication Sig  . aspirin EC 81 MG tablet Take 81 mg by mouth every morning.  Marland Kitchen  azaTHIOprine (IMURAN) 50 MG tablet Take 3 tablets (150 mg total) by mouth daily.  . calcium-vitamin D (OSCAL WITH D) 500-200 MG-UNIT per tablet Take 1 tablet by mouth 2 (two) times daily.  . cetirizine (ZYRTEC) 10 MG tablet Take 10 mg by mouth daily.  . cholecalciferol (VITAMIN D)  1000 UNITS tablet Take 500 Units by mouth daily.   . diphenhydrAMINE (BENADRYL) 25 MG tablet Take 25 mg by mouth at bedtime.  . imiquimod (ALDARA) 5 % cream Apply 1 application topically daily.  . Multiple Vitamin (MULTIVITAMIN WITH MINERALS) TABS Take 1 tablet by mouth daily.  Marland Kitchen omeprazole (PRILOSEC) 20 MG capsule Take 20 mg by mouth every morning.   . pyridostigmine (MESTINON) 60 MG tablet Take 0.5 tablets (30 mg total) by mouth every 8 (eight) hours.  . ramipril (ALTACE) 10 MG capsule Take 10 mg by mouth daily.     PAST MEDICAL HISTORY: Past Medical History  Diagnosis Date  . Sleep apnea   . Hypertension   . Squamous carcinoma   . Myasthenia gravis   . GERD (gastroesophageal reflux disease)     PAST SURGICAL HISTORY: Past Surgical History  Procedure Laterality Date  . Skin cancer removal      FAMILY HISTORY: Family History  Problem Relation Age of Onset  . Heart disease Mother   . Cancer - Other Father   . Heart disease Sister   . Heart disease Brother   . Bipolar disorder Sister   . Bipolar disorder Sister     suicide  . Healthy Brother     SOCIAL HISTORY:  History   Social History  . Marital Status: Married    Spouse Name: Oscar Jacobs    Number of Children: 3  . Years of Education: MD   Occupational History  . Retired     Radiology MD   Social History Main Topics  . Smoking status: Never Smoker   . Smokeless tobacco: Never Used  . Alcohol Use: Yes     Comment: seldom  . Drug Use: No  . Sexual Activity: Not on file   Other Topics Concern  . Not on file   Social History Narrative   Patient is married (Oscar) and lives at home with his wife.   Patient has three adult children.   Patient is retired, Stage manager.   Married.  Ambulates unassisted.   Caffeine Use- 4 cups daily.   Patient is right-handed.   Patient has a college education.     PHYSICAL EXAM  Filed Vitals:   02/07/14 0846  BP: 136/81  Pulse: 62  Height: 5\' 10"   (1.778 m)  Weight: 197 lb 12.8 oz (89.721 kg)   Body mass index is 28.38 kg/(m^2).  GENERAL EXAM: Patient is in no distress  CARDIOVASCULAR: Regular rate and rhythm, no murmurs, no carotid bruits  NEUROLOGIC: MENTAL STATUS: awake, alert, language fluent, comprehension intact, naming intact CRANIAL NERVE: pupils equal and reactive to light, visual fields full to confrontation, extraocular muscles intact, no nystagmus, facial sensation and strength symmetric, uvula midline, shoulder shrug symmetric, tongue midline. SUBTLE LEFT PTOSIS. NO DIPLOPIA OR WORSENING PTOSIS AFTER 30 SEC UPGAZE.  MOTOR: NORMAL TONE; 5/5 IN BUE AND BLE.  SENSORY: normal and symmetric to light touch and vibration COORDINATION: finger-nose-finger, fine finger movements normal REFLEXES: deep tendon reflexes present and symmetric; TRACE AT ANKLES. GAIT/STATION: narrow based gait; ABLE TO WALK ON TOES AND HEELS. ROMBERG NEGATIVE.   DIAGNOSTIC DATA (LABS, IMAGING, TESTING) - I reviewed patient  records, labs, notes, testing and imaging myself where available.  Lab Results  Component Value Date   WBC 5.6 04/07/2013   HGB 12.8 04/07/2013   HCT 36.8* 04/07/2013   MCV 96 04/07/2013   PLT 164 04/07/2013   Lab Results  Component Value Date   CREATININE 1.30* 04/07/2013   BUN 18 04/07/2013   NA 143 04/07/2013   K 4.2 04/07/2013   CL 104 04/07/2013   CO2 23 04/07/2013   Lab Results  Component Value Date   ALT 21 04/07/2013   AST 23 04/07/2013   ALKPHOS 55 04/07/2013   BILITOT 0.4 04/07/2013     08/05/12 AchR binding 33.80 (h), AchR blocking 36% (h), AchR modulating 38% (h), anti-striational (1:80 (h)  08/05/12 B12 643, TSH 4.07, FT4 1.1, BUN/Cr 21/1.7  08/06/12 CT chest - no thymoma, small nodules within the thyroid gland are likely benign, left lower lobe pulmonary nodule, Bosniak I cyst of the right kidney is partially imaged, coronary artery calcifications.  08/06/12 EMG/NCS  1. Decremental response on  repetitive nerve stimulation study of greater than 30%, consistent with neuromuscular junction disorder such as myasthenia gravis. 2. No evidence of underlying myopathy or motor neuropathy. 3. Decreased motor unit recruitment of the left flexor carpi radialis muscle may indicate underlying cervical radiculopathy (C6, C7), but not confirmed on paraspinal muscle testing or nerve conduction studies.  01/21/14 CBC (WBC 4.5, Plt 207, LFTs normal)   ASSESSMENT AND PLAN  73 y.o. year old male  has a past medical history of Sleep apnea; Hypertension; Squamous carcinoma; Myasthenia gravis; and GERD (gastroesophageal reflux disease). here with myasthenia gravis based on history and exam, EMG and lab testing. Did well with mestinon and prednisone until he developed steroid psychosis/mania. Now transitioned to imuran and doing well.  Problem List:  Myasthenia gravis - stable  Steroid psychosis/mania - RESOLVED  Thrombocytopenia - RESOLVED   PLAN: 1. Continue pyridostigmine 30 mg TID 2. Continue imuran 150mg  daily 3. Continue CBC, CMP monitoring (for imuran) q10months  Return in about 6 months (around 08/09/2014).    Penni Bombard, MD 99/37/1696, 7:89 AM Certified in Neurology, Neurophysiology and Neuroimaging  [facility] [address], [place], [place] [address] [phone]

## 2014-02-07 NOTE — Patient Instructions (Signed)
Repeat CBC and CMP in 3 months.

## 2014-02-10 ENCOUNTER — Ambulatory Visit (INDEPENDENT_AMBULATORY_CARE_PROVIDER_SITE_OTHER): Payer: Medicare Other | Admitting: Neurology

## 2014-02-10 ENCOUNTER — Encounter: Payer: Self-pay | Admitting: Neurology

## 2014-02-10 VITALS — BP 126/73 | HR 73 | Temp 97.8°F | Resp 14 | Ht 70.0 in | Wt 196.0 lb

## 2014-02-10 DIAGNOSIS — Z9114 Patient's other noncompliance with medication regimen: Secondary | ICD-10-CM | POA: Diagnosis not present

## 2014-02-10 DIAGNOSIS — G4731 Primary central sleep apnea: Secondary | ICD-10-CM

## 2014-02-10 NOTE — Patient Instructions (Signed)
Sleep Apnea  Sleep apnea is disorder that affects a person's sleep. A person with sleep apnea has abnormal pauses in their breathing when they sleep. It is hard for them to get a good sleep. This makes a person tired during the day. It also can lead to other physical problems. There are three types of sleep apnea. One type is when breathing stops for a short time because your airway is blocked (obstructive sleep apnea). Another type is when the brain sometimes fails to give the normal signal to breathe to the muscles that control your breathing (central sleep apnea). The third type is a combination of the other two types.  HOME CARE  · Do not sleep on your back. Try to sleep on your side.  · Take all medicine as told by your doctor.  · Avoid alcohol, calming medicines (sedatives), and depressant drugs.  · Try to lose weight if you are overweight. Talk to your doctor about a healthy weight goal.  Your doctor may have you use a device that helps to open your airway. It can help you get the air that you need. It is called a positive airway pressure (PAP) device. There are three types of PAP devices:  · Continuous positive airway pressure (CPAP) device.  · Nasal expiratory positive airway pressure (EPAP) device.  · Bilevel positive airway pressure (BPAP) device.  MAKE SURE YOU:  · Understand these instructions.  · Will watch your condition.  · Will get help right away if you are not doing well or get worse.  Document Released: 01/23/2008 Document Revised: 04/01/2012 Document Reviewed: 08/17/2011  ExitCare® Patient Information ©2015 [employer]. This information is not intended to replace advice given to you by your health care provider. Make sure you discuss any questions you have with your health care provider.

## 2014-02-10 NOTE — Progress Notes (Signed)
[facility]  PATIENT: Oscar Jacobs DOB: 06-24-1940  REFERRING CLINICIAN:  HISTORY FROM: patient and wife REASON FOR VISIT: follow up   HISTORICAL  CHIEF COMPLAINT:  Chief Complaint  Patient presents with  . 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 This study was performed on 11-24-13. The patient at the time had an AHI of 64.8, an RDI of 66.0 disconsolate severe sleep apnea. He didn't have any periodic limb movements and device majority of his arousals with respiratory cost. Cardiac heart rate was very irregular between 54 and 77 beats per minute with variable R. to R interval's. It appears that this patient had sinus tachycardia and some wide complex tachycardia during sleep.  Atrial fibrillation was found, a condition that he shares with his son, paternal uncle.  The patient was titrated beginning at 5 cm water pressure to 10 cm water pressure even when reaching tandem his AHI was still 8.1 which is a reduction but not optimization of CPAP therapy. The patient was placed on 10 cm water pressure this 3 cm EPR and him and nasal mask instead of a full face mask. He also has lost some weight. His BMI was actually 36.7 at the time of the study in July. It appears that the patient has very frequent , cyclic ,mostly central apneas. There was very little respiratory effort. He benefited from titration,  The diagnoses was complex apnea. He was prescribed the CPAP and has brought the machine here .  Compliance download.   The download showed immediate and daily usage of 3 hours and 30 minutes but only for 5 of 30 days. The AHI was 22.8. The medium pressure was 13.2 cm water. This is a high AHI and the noncompliance issue I think that this patient may need to switch to a BiPAP therapy. He reports that he likes feels much better than a full face mask, he also reports that he  has and assisted him using CPAP, he can tell a difference in breathing in the degree of refreshing or restorative quality of sleep.     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 Leta Baptist . He was seen in my sleep clinic last in 2008.   He was diagnosed with an AHI of 16 in 12-1 2012 .He has meanwhile developed myasthenia and treated by Dr. Leta Baptist. He hadn't used CPAP in several years , felt "well enough ", but just a couple of days ago restarted using it. He brought the machine with him today.  He has a severe residual AHi of 15 and he has noted severe air leaks. I would like for him to undergo a retest to see if apnea is still present, and if so, if treatment is indicated.  His myasthenia can certainly contribute to respiratory difficulties.  He takes antihistamines to help him sleep, and finds himself more drowsy in AM,  CPAP helped this , and he had less rhinitis while using it.  His usual bedtime is around 11 PM, and he falls asleep quickly.  He wakes once or twice for bathroom break, but usually sleeps through otherwise.His wife has reported that he is snoring again, she has also witnessed apneas which have concerned her.  The seems to be a strong positional component to snoring and  apnea in this patient. He rises in the morning around 6:30 AM, he wakes up spontaneously and does not need another arm. He feels usually refreshed , has 2 coffees and has no urge to nap. If he is unstimulated , he drifts off, especially when the TV is boring. He usually sits in a lounge chair while napping. He has at least 7 hours of sleep.  [name] endorsed  the geriatric depression score at one point, the fatigue severity score at 37 points and the for a score at 8 points.    He [place]  [place]. Pesticides were used, he was exposed to arsenic and tobacco gum.  His daughters work as a Chief Executive Officer in [place], [place] and one Lexicographer in [place] and one is a  Warden/ranger in [place] . His wife is a Industrial/product designer in [place].  His mother died at age 70, her sister at 19 and her mother at 55 years of age.        Previous notes :   UPDATE 04/09/13: Continues to do well on imuran 150mg  daily. No side effects. Has been to PCP (Dr. Joylene Draft) for annual physical; PSA slightly elevated and has follow up. Patient has gained some weight (he thinks due to inactivity). Otherwise no concerns. No SOB, weakness, numbness.  UPDATE 09/10/12: Since last visit, doing much better on prednisone. Now on prednisone 60mg  daily. Taking calcium + Vit D, omeprazole. No side effects, except for slightly "buzzy" mood. Sleep apnea better. Strength improved. Almost back to normal. Some left ptosis persists. Some chewing difficulty towards end of meals.  UPDATE 08/13/12: Since last visit, EMG and lab testing confirm myasthenia gravis diagnosis. I prescribed Mestinon for patient. He has had a positive response. He tried 30 mg 3 times a day, but now takes 15 mg 6 times a day, do to see increasing secretions and diarrhea. Medications lasting approximately 4 hours. No shortness of breath. Continues to have left ptosis, some neck weakness and left arm weakness. Some mild slurred speech. No trouble swallowing. Patient does have diagnosis of sleep apnea, but he has not used his CPAP machine last 2 or 3 years.  REVIEW OF SYSTEMS: Full 14 system review of systems performed and notable :  snoring , sleep apnea, runny nose.  ALLERGIES: Allergies  Allergen Reactions  . Prednisone Other (See Comments)    manic  . Penicillins Rash  . Risperidone And Related Other (See Comments)    hyperactivity    HOME MEDICATIONS:  Outpatient Encounter Prescriptions as of 02/10/2014  Medication Sig  . aspirin EC 81 MG tablet Take 81 mg by mouth every morning.  Marland Kitchen azaTHIOprine (IMURAN) 50 MG tablet Take 3 tablets (150 mg total) by mouth daily.  . calcium-vitamin D (OSCAL WITH D)  500-200 MG-UNIT per tablet Take 1 tablet by mouth 2 (two) times daily.  . cetirizine (ZYRTEC) 10 MG tablet Take 10 mg by mouth daily.  . cholecalciferol (VITAMIN D) 1000 UNITS tablet Take 500 Units by mouth daily.   . diphenhydrAMINE (BENADRYL) 25 MG tablet Take 25 mg by mouth at bedtime.  . imiquimod (ALDARA) 5 % cream Apply 1 application topically daily.  . Multiple Vitamin (MULTIVITAMIN WITH MINERALS) TABS Take 1 tablet by mouth daily.  Marland Kitchen omeprazole (PRILOSEC) 20 MG capsule Take 20 mg by mouth every morning.   . pyridostigmine (MESTINON) 60 MG tablet Take 0.5 tablets (30 mg total) by mouth every 8 (eight) hours.  . ramipril (ALTACE) 10  MG capsule Take 10 mg by mouth daily.     PAST MEDICAL HISTORY: Past Medical History  Diagnosis Date  . Sleep apnea   . Hypertension   . Squamous carcinoma   . Myasthenia gravis   . GERD (gastroesophageal reflux disease)     PAST SURGICAL HISTORY: Past Surgical History  Procedure Laterality Date  . Skin cancer removal      FAMILY HISTORY: Family History  Problem Relation Age of Onset  . Heart disease Mother   . Cancer - Other Father   . Heart disease Sister   . Heart disease Brother   . Bipolar disorder Sister   . Bipolar disorder Sister     suicide  . Healthy Brother     SOCIAL HISTORY:  History   Social History  . Marital Status: Married    Spouse Name: Oscar Jacobs    Number of Children: 3  . Years of Education: MD   Occupational History  . Retired     Radiology MD   Social History Main Topics  . Smoking status: Never Smoker   . Smokeless tobacco: Never Used  . Alcohol Use: Yes     Comment: seldom  . Drug Use: No  . Sexual Activity: Not on file   Other Topics Concern  . Not on file   Social History Narrative   Patient is married (Oscar) and lives at home with his wife.   Patient has three adult children.   Patient is retired, Stage manager.   Married.  Ambulates unassisted.   Caffeine Use- 4 cups  daily.   Patient is right-handed.   Patient has a college education.     PHYSICAL EXAM  Filed Vitals:   02/10/14 1414  BP: 126/73  Pulse: 73  Temp: 97.8 F (36.6 C)  TempSrc: Oral  Resp: 14  Height: 5\' 10"  (1.778 m)  Weight: 196 lb (88.905 kg)   Body mass index is 28.12 kg/(m^2).  GENERAL EXAM: Patient is in no distress  CARDIOVASCULAR: irregular rate and rhythm, - atrial fibrilation ?  Dr. Joylene Draft aware. \no murmurs, no carotid bruits  NEUROLOGIC:  Neck circumference 16.5  inches,  mallompati 4 ,  Mild retrognathia,  no click at TMJ. No dysphagia. Myasthenia manifestation ? Ptosis.     MENTAL STATUS: awake, alert, language fluent, comprehension intact, naming intact CRANIAL NERVE: pupils equal and reactive to light, visual fields full to confrontation, extraocular muscles intact, no nystagmus, facial sensation and strength symmetric, uvula midline, shoulder shrug symmetric, tongue midline. BILATERAL PTOSIS.   MOTOR: 5/5 IN  Upper and lower extremities.  SENSORY: normal and symmetric to light touch. COORDINATION: finger-nose-finger, fine finger movements normal.  REFLEXES: deep tendon reflexes present and symmetric; attenuated  achilles reflex.  GAIT/STATION: narrow based gait;    DIAGNOSTIC DATA (LABS, IMAGING, TESTING) - I reviewed patient records, labs, notes, testing and imaging myself where available. 08/05/12 AchR binding 33.80 (h), AchR blocking 36% (h), AchR modulating 38% (h), anti-striational (1:80 (h)  08/05/12 B12 643, TSH 4.07, FT4 1.1, BUN/Cr 21/1.7  08/06/12 CT chest - no thymoma, small nodules within the thyroid gland are likely benign, left lower lobe pulmonary nodule, Bosniak I cyst of the right kidney is partially imaged, coronary artery calcifications.  08/06/12 EMG/NCS  1. Decremental response on repetitive nerve stimulation study of greater than 30%, consistent with neuromuscular junction disorder such as myasthenia gravis. 2. No evidence of  underlying myopathy or motor neuropathy. 3. Decreased motor unit recruitment of the left  flexor carpi radialis muscle may indicate underlying cervical radiculopathy (C6, C7), but not confirmed on paraspinal muscle testing or nerve conduction studies.  Sleep study results quoted above . CD  ASSESSMENT AND PLAN  73 y.o. year old male  has a past medical history of Sleep apnea; Hypertension; Squamous carcinoma; Myasthenia gravis; and GERD (gastroesophageal reflux disease). here with undertreated SA-  Non compliance and highest residual AHI 22. 8 if may have exacerbated since 2007 .  Based on history and exam, Did well with mestinon and prednisone until he developed steroid psychosis/mania.  Based on today's exam and description of symptoms I would suggest to retitrate this central componenet apnea to BiPAP or adapt SV ? Download is inconclusive .    [place], [facility] [address] [place], [place] [address] [phone]

## 2014-03-01 ENCOUNTER — Ambulatory Visit: Payer: Medicare Other | Admitting: Diagnostic Neuroimaging

## 2014-03-21 ENCOUNTER — Ambulatory Visit (INDEPENDENT_AMBULATORY_CARE_PROVIDER_SITE_OTHER): Payer: Medicare Other | Admitting: Neurology

## 2014-03-21 DIAGNOSIS — G4731 Primary central sleep apnea: Secondary | ICD-10-CM | POA: Diagnosis not present

## 2014-03-21 DIAGNOSIS — Z9114 Patient's other noncompliance with medication regimen: Secondary | ICD-10-CM

## 2014-03-22 NOTE — Sleep Study (Signed)
Please see the scanned sleep study interpretation located in the Procedure tab within the Chart Review section. 

## 2014-04-05 ENCOUNTER — Encounter: Payer: Self-pay | Admitting: *Deleted

## 2014-04-05 ENCOUNTER — Encounter: Payer: Self-pay | Admitting: Neurology

## 2014-04-05 ENCOUNTER — Other Ambulatory Visit: Payer: Self-pay | Admitting: Neurology

## 2014-04-05 ENCOUNTER — Telehealth: Payer: Self-pay | Admitting: *Deleted

## 2014-04-05 DIAGNOSIS — G4731 Primary central sleep apnea: Secondary | ICD-10-CM

## 2014-04-05 DIAGNOSIS — G4733 Obstructive sleep apnea (adult) (pediatric): Secondary | ICD-10-CM

## 2014-04-05 NOTE — Telephone Encounter (Signed)
Patient was contacted and provided the results of his overnight CPAP titration study.  Patient was informed that an optimal CPAP pressure was recommended by Dr. Brett Fairy.  Patient requested and was referred to [facility] for CPAP set up.  The patient gave verbal permission to mail a copy of her test results.   Patient instructed to contact our office 6-8 weeks post set up to schedule a follow up appointment.

## 2014-05-10 ENCOUNTER — Encounter: Payer: Self-pay | Admitting: Neurology

## 2014-05-10 ENCOUNTER — Telehealth: Payer: Self-pay | Admitting: Diagnostic Neuroimaging

## 2014-05-10 NOTE — Telephone Encounter (Signed)
Patient is calling to get pre authorization for medication Azathioprine 50 mil.  Patient takes 3 per day.  Patient states his case number is [id number].

## 2014-05-10 NOTE — Telephone Encounter (Signed)
All requested info has been provided to ins.  Request is currently under review.

## 2014-05-12 ENCOUNTER — Telehealth: Payer: Self-pay

## 2014-05-12 NOTE — Telephone Encounter (Signed)
[employer] has approved the request for coverage on Azathioprine effective until 04/29/2015 Ref # [id number]

## 2014-05-31 ENCOUNTER — Ambulatory Visit (INDEPENDENT_AMBULATORY_CARE_PROVIDER_SITE_OTHER): Payer: Medicare Other | Admitting: Neurology

## 2014-05-31 ENCOUNTER — Encounter: Payer: Self-pay | Admitting: Neurology

## 2014-05-31 VITALS — BP 128/62 | HR 80 | Resp 14 | Ht 70.0 in | Wt 209.0 lb

## 2014-05-31 DIAGNOSIS — G4730 Sleep apnea, unspecified: Secondary | ICD-10-CM | POA: Insufficient documentation

## 2014-05-31 DIAGNOSIS — G4733 Obstructive sleep apnea (adult) (pediatric): Secondary | ICD-10-CM

## 2014-05-31 DIAGNOSIS — Z9989 Dependence on other enabling machines and devices: Principal | ICD-10-CM

## 2014-05-31 NOTE — Progress Notes (Signed)
[facility]  PATIENT: Oscar Jacobs DOB: [birth year]  REFERRING CLINICIAN:  HISTORY FROM: patient and wife REASON FOR VISIT: follow up on CPAP compliance   HISTORICAL  CHIEF COMPLAINT:  Chief Complaint  Patient presents with  . 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 This study was performed on 11-24-13. The patient at the time had an AHI of 64.8, an RDI of 66.0 disconsolate severe sleep apnea. He didn't have any periodic limb movements and device majority of his arousals with respiratory cost. Cardiac heart rate was very irregular between 54 and 77 beats per minute with variable R. to R interval's. It appears that this patient had sinus tachycardia and some wide complex tachycardia during sleep.  Atrial fibrillation was found, a condition that he shares with his son, paternal uncle.  The patient was titrated beginning at 5 cm water pressure to 10 cm water pressure even when reaching tandem his AHI was still 8.1 which is a reduction but not optimization of CPAP therapy. The patient was placed on 10 cm water pressure this 3 cm EPR and him and nasal mask instead of a full face mask. He also has lost some weight. His BMI was actually 36.7 at the time of the study in July. It appears that the patient has very frequent , cyclic ,mostly central apneas. There was very little respiratory effort. He benefited from titration,  The diagnoses was complex apnea. He was prescribed the CPAP and has brought the machine here .  Compliance download.   The download showed immediate and daily usage of 3 hours and 30 minutes but only for 5 of 30 days. The AHI was 22.8. The medium pressure was 13.2 cm water. This is a high AHI and the noncompliance issue I think that this patient may need to switch to a BiPAP therapy. He reports that he likes feels much better than a full face mask, he  also reports that he has and assisted him using CPAP, he can tell a difference in breathing in the degree of refreshing or restorative quality of sleep.  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 The patient's medication has not changed except that he does know longer take Benadryl at bedtime. He has stated that he likes to use his machine that it works well for him in that he got well adjusted to it. A 30 day download from 1-30 1-16 showed 100% compliance for 30 out of 30 days and 100% compliance for over 4 hours of nightly use. His average usage is 7 hours and 44 minutes his CPAP is set at 10 cm water pressure with 3 cm EPR he has some moderate air leaks but his with digital AHI is only 2.9 so seems not to affect the apnea therapy.     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 . He was seen in my sleep clinic last in 2008.   He was diagnosed with an AHI of 16 in 12-1 2012 .He has meanwhile developed myasthenia and treated by [name]. He hadn't used CPAP in several years , felt "well enough ", but just a  couple of days ago restarted using it. He brought the machine with him today.  He has a severe residual AHi of 15 and he has noted severe air leaks. I would like for him to undergo a retest to see if apnea is still present, and if so, if treatment is indicated.  His myasthenia can certainly contribute to respiratory difficulties.  He takes antihistamines to help him sleep, and finds himself more drowsy in AM,  CPAP helped this , and he had less rhinitis while using it.  His usual bedtime is around 11 PM, and he falls asleep quickly.  He wakes once or twice for bathroom break, but usually sleeps through otherwise.His wife has reported that he is snoring again, she has also witnessed apneas which have  concerned her.  The seems to be a strong positional component to snoring and apnea in this patient. He rises in the morning around 6:30 AM, he wakes up spontaneously and does not need another arm. He feels usually refreshed , has 2 coffees and has no urge to nap. If he is unstimulated , he drifts off, especially when the TV is boring. He usually sits in a lounge chair while napping. He has at least 7 hours of sleep.  [name] endorsed  the geriatric depression score at one point, the fatigue severity score at 37 points and the for a score at 8 points.   He [place]  [place]. Pesticides were used, he was exposed to arsenic and tobacco gum.  His daughters work as a Chief Executive Officer in [place], [place] and one Lexicographer in [place] and one is a Warden/ranger in [place] . His wife is a Industrial/product designer in [place]. His mother died at age 19, her sister at 3 and her mother at 16 years of age.  Previous notes :  UPDATE 09/10/12: Since last visit, doing much better on prednisone. Now on prednisone 60mg  daily. Taking calcium + Vit D, omeprazole. No side effects, except for slightly "buzzy" mood. Sleep apnea better. Strength improved. Almost back to normal. Some left ptosis persists. Some chewing difficulty towards end of meals.  UPDATE 08/13/12: Since last visit, EMG and lab testing confirm myasthenia gravis diagnosis. I prescribed Mestinon for patient. He has had a positive response. He tried 30 mg 3 times a day, but now takes 15 mg 6 times a day, do to see increasing secretions and diarrhea. Medications lasting approximately 4 hours. No shortness of breath. Continues to have left ptosis, some neck weakness and left arm weakness. Some mild slurred speech. No trouble swallowing. Patient does have diagnosis of sleep apnea, but he has not used his CPAP machine last 2 or 3 years.  REVIEW OF SYSTEMS: Full 14 system review of systems performed and notable :  snoring , sleep apnea, runny nose.he  endorsed the fatigue score today at 25 and the Epworth sleepiness score at 6 points   ALLERGIES: Allergies  Allergen Reactions  . Prednisone Other (See Comments)    manic  . Penicillins Rash  . Risperidone And Related Other (See Comments)    hyperactivity    HOME MEDICATIONS:  Outpatient Encounter Prescriptions as of 05/31/2014  Medication Sig  . aspirin EC 81 MG tablet Take 81 mg by mouth every morning.  Marland Kitchen azaTHIOprine (IMURAN) 50 MG tablet Take 3 tablets (150 mg total) by mouth daily.  . calcium-vitamin D (OSCAL WITH D) 500-200 MG-UNIT per tablet Take 1 tablet by mouth 2 (two) times daily.  Marland Kitchen  cetirizine (ZYRTEC) 10 MG tablet Take 10 mg by mouth daily.  . cholecalciferol (VITAMIN D) 1000 UNITS tablet Take 500 Units by mouth daily.   . imiquimod (ALDARA) 5 % cream Apply 1 application topically daily.  . Multiple Vitamin (MULTIVITAMIN WITH MINERALS) TABS Take 1 tablet by mouth daily.  Marland Kitchen omeprazole (PRILOSEC) 20 MG capsule Take 20 mg by mouth every morning.   . pyridostigmine (MESTINON) 60 MG tablet Take 0.5 tablets (30 mg total) by mouth every 8 (eight) hours.  . ramipril (ALTACE) 10 MG capsule Take 10 mg by mouth daily.  . [DISCONTINUED] diphenhydrAMINE (BENADRYL) 25 MG tablet Take 25 mg by mouth at bedtime.     PAST MEDICAL HISTORY: Past Medical History  Diagnosis Date  . Sleep apnea   . Hypertension   . Squamous carcinoma   . Myasthenia gravis   . GERD (gastroesophageal reflux disease)     PAST SURGICAL HISTORY: Past Surgical History  Procedure Laterality Date  . Skin cancer removal      FAMILY HISTORY: Family History  Problem Relation Age of Onset  . Heart disease Mother   . Cancer - Other Father   . Heart disease Sister   . Heart disease Brother   . Bipolar disorder Sister   . Bipolar disorder Sister     suicide  . Healthy Brother     SOCIAL HISTORY:  History   Social History  . Marital Status: Married    Spouse Name: [name] Landers Prajapati    Number  of Children: 3  . Years of Education: MD   Occupational History  . Retired     Radiology MD   Social History Main Topics  . Smoking status: Never Smoker   . Smokeless tobacco: Never Used  . Alcohol Use: Yes     Comment: seldom  . Drug Use: No  . Sexual Activity: Not on file   Other Topics Concern  . Not on file   Social History Narrative   Patient is married ([name]) and lives at home with his wife.   Patient has three adult children.   Patient is retired, Stage manager.   Married.  Ambulates unassisted.   Caffeine Use- 4 cups daily.   Patient is right-handed.   Patient has a college education.     PHYSICAL EXAM  Filed Vitals:   05/31/14 1443  BP: 128/62  Pulse: 80  Resp: 14  Height: 5\' 10"  (1.778 m)  Weight: 209 lb (94.802 kg)   Body mass index is 29.99 kg/(m^2).  GENERAL EXAM: Patient is in no distress  CARDIOVASCULAR: irregular rate and rhythm, - atrial fibrilation ?  Dr. Joylene Draft aware. \no murmurs, no carotid bruits  NEUROLOGIC:  Neck circumference 16.5  inches,  mallompati 4 ,  Mild retrognathia,  no click at TMJ. No dysphagia. Myasthenia manifestation ? Ptosis.     MENTAL STATUS: awake, alert, language fluent, comprehension intact, naming intact CRANIAL NERVE: pupils equal and reactive to light, visual fields full to confrontation, extraocular muscles intact, no nystagmus, facial sensation and strength symmetric, uvula midline, shoulder shrug symmetric, tongue midline. BILATERAL PTOSIS.  MOTOR: 5/5 IN  Upper and lower extremities.  SENSORY: normal and symmetric to light touch. COORDINATION: finger-nose-finger, fine finger movements normal. REFLEXES: deep tendon reflexes present and symmetric; attenuated  achilles reflex.  GAIT/STATION: narrow based gait;   1. Decremental response on repetitive nerve stimulation study of greater than 30%, consistent with neuromuscular junction disorder such as myasthenia gravis. 2. No evidence of underlying  myopathy or motor neuropathy. 3. Decreased motor unit recruitment of the left flexor carpi radialis muscle may indicate underlying cervical radiculopathy (C6, C7), but not confirmed on paraspinal muscle testing or nerve conduction studies.  Sleep study results quoted above . CD  ASSESSMENT AND PLAN   Repeated sleep study confirmed CSA /OSA and he was titrated to 10 cm water , he is 100% compliant on his 30 day download.  He has less dysphagia, and only mild ptosis on the left.  He will follow up in 12 mnth for CPAP compliance.   [place] [address], [place], [place] [address] [phone]

## 2014-07-02 ENCOUNTER — Other Ambulatory Visit: Payer: Self-pay | Admitting: Diagnostic Neuroimaging

## 2014-07-06 NOTE — Telephone Encounter (Signed)
The clinic was supposed to be doing refills, however this one did not get completed by them.

## 2014-07-11 ENCOUNTER — Encounter: Payer: Self-pay | Admitting: Neurology

## 2014-08-03 ENCOUNTER — Other Ambulatory Visit: Payer: Self-pay | Admitting: Diagnostic Neuroimaging

## 2014-08-03 ENCOUNTER — Telehealth: Payer: Self-pay | Admitting: *Deleted

## 2014-08-03 ENCOUNTER — Other Ambulatory Visit: Payer: Self-pay | Admitting: *Deleted

## 2014-08-03 ENCOUNTER — Other Ambulatory Visit: Payer: Self-pay | Admitting: Internal Medicine

## 2014-08-03 ENCOUNTER — Other Ambulatory Visit (INDEPENDENT_AMBULATORY_CARE_PROVIDER_SITE_OTHER): Payer: Self-pay

## 2014-08-03 DIAGNOSIS — L858 Other specified epidermal thickening: Secondary | ICD-10-CM | POA: Diagnosis not present

## 2014-08-03 DIAGNOSIS — Z0289 Encounter for other administrative examinations: Secondary | ICD-10-CM

## 2014-08-03 DIAGNOSIS — G7000 Myasthenia gravis without (acute) exacerbation: Secondary | ICD-10-CM

## 2014-08-03 DIAGNOSIS — C61 Malignant neoplasm of prostate: Secondary | ICD-10-CM

## 2014-08-03 DIAGNOSIS — L433 Subacute (active) lichen planus: Secondary | ICD-10-CM | POA: Diagnosis not present

## 2014-08-03 DIAGNOSIS — L821 Other seborrheic keratosis: Secondary | ICD-10-CM | POA: Diagnosis not present

## 2014-08-03 DIAGNOSIS — D1801 Hemangioma of skin and subcutaneous tissue: Secondary | ICD-10-CM | POA: Diagnosis not present

## 2014-08-03 DIAGNOSIS — Z85828 Personal history of other malignant neoplasm of skin: Secondary | ICD-10-CM | POA: Diagnosis not present

## 2014-08-03 DIAGNOSIS — Z125 Encounter for screening for malignant neoplasm of prostate: Secondary | ICD-10-CM

## 2014-08-03 DIAGNOSIS — L570 Actinic keratosis: Secondary | ICD-10-CM | POA: Diagnosis not present

## 2014-08-03 NOTE — Telephone Encounter (Signed)
Spoke with Estill Bamberg, RN on the phone who I was able to walk through the process of ordering a lab in epic. She ordered the PSA lab for Dr. Joylene Draft. I thanked her

## 2014-08-03 NOTE — Telephone Encounter (Signed)
Called and left a message for Estill Bamberg, RN for Dr. Joylene Draft explaining that the pt had stated that Dr. Joylene Draft wanted to check a PSA and asked if it could be done while he was here in the office getting other lab work done. Dr. Mamie Nick was agreeable but the order would not work so Dr. Mamie Nick asked me to call Dr. Silvestre Mesi office and ask them to place the order. I asked amanda to place the order for PSA so the pt would not be charged and explained that if they did not, he would receive a bill from Medicaid. Asked her to call back with further questions

## 2014-08-04 LAB — CBC WITH DIFFERENTIAL/PLATELET
Basophils Absolute: 0 10*3/uL (ref 0.0–0.2)
Basos: 0 %
Eos: 3 %
Eosinophils Absolute: 0.2 10*3/uL (ref 0.0–0.4)
HCT: 40.7 % (ref 37.5–51.0)
HEMOGLOBIN: 13.9 g/dL (ref 12.6–17.7)
IMMATURE GRANS (ABS): 0 10*3/uL (ref 0.0–0.1)
Immature Granulocytes: 0 %
Lymphocytes Absolute: 1 10*3/uL (ref 0.7–3.1)
Lymphs: 22 %
MCH: 33.3 pg — ABNORMAL HIGH (ref 26.6–33.0)
MCHC: 34.2 g/dL (ref 31.5–35.7)
MCV: 98 fL — ABNORMAL HIGH (ref 79–97)
MONOS ABS: 0.4 10*3/uL (ref 0.1–0.9)
Monocytes: 9 %
NEUTROS PCT: 66 %
Neutrophils Absolute: 3 10*3/uL (ref 1.4–7.0)
Platelets: 180 10*3/uL (ref 150–379)
RBC: 4.17 x10E6/uL (ref 4.14–5.80)
RDW: 16.1 % — AB (ref 12.3–15.4)
WBC: 4.7 10*3/uL (ref 3.4–10.8)

## 2014-08-04 LAB — COMPREHENSIVE METABOLIC PANEL
ALT: 17 IU/L (ref 0–44)
AST: 20 IU/L (ref 0–40)
Albumin/Globulin Ratio: 1.8 (ref 1.1–2.5)
Albumin: 4.4 g/dL (ref 3.5–4.8)
Alkaline Phosphatase: 50 IU/L (ref 39–117)
BUN/Creatinine Ratio: 17 (ref 10–22)
BUN: 22 mg/dL (ref 8–27)
Bilirubin Total: 0.6 mg/dL (ref 0.0–1.2)
CHLORIDE: 102 mmol/L (ref 97–108)
CO2: 22 mmol/L (ref 18–29)
Calcium: 9.1 mg/dL (ref 8.6–10.2)
Creatinine, Ser: 1.31 mg/dL — ABNORMAL HIGH (ref 0.76–1.27)
GFR calc non Af Amer: 53 mL/min/{1.73_m2} — ABNORMAL LOW (ref >59)
GFR, EST AFRICAN AMERICAN: 62 mL/min/{1.73_m2} (ref >59)
GLUCOSE: 92 mg/dL (ref 65–99)
Globulin, Total: 2.5 g/dL (ref 1.5–4.5)
POTASSIUM: 4.8 mmol/L (ref 3.5–5.2)
SODIUM: 140 mmol/L (ref 134–144)
Total Protein: 6.9 g/dL (ref 6.0–8.5)

## 2014-08-09 ENCOUNTER — Ambulatory Visit (INDEPENDENT_AMBULATORY_CARE_PROVIDER_SITE_OTHER): Payer: Medicare Other | Admitting: Diagnostic Neuroimaging

## 2014-08-09 ENCOUNTER — Encounter: Payer: Self-pay | Admitting: Diagnostic Neuroimaging

## 2014-08-09 VITALS — BP 136/80 | HR 63 | Ht 70.0 in | Wt 202.7 lb

## 2014-08-09 DIAGNOSIS — G7000 Myasthenia gravis without (acute) exacerbation: Secondary | ICD-10-CM | POA: Diagnosis not present

## 2014-08-09 MED ORDER — PYRIDOSTIGMINE BROMIDE 60 MG PO TABS: 30.0000 mg | ORAL_TABLET | Freq: Three times a day (TID) | ORAL | Status: DC

## 2014-08-09 MED ORDER — AZATHIOPRINE 50 MG PO TABS: 150.0000 mg | ORAL_TABLET | Freq: Every day | ORAL | Status: DC

## 2014-08-09 NOTE — Progress Notes (Signed)
[facility]  PATIENT: Oscar Jacobs DOB: Oct 17, 1940  REFERRING CLINICIAN:  HISTORY FROM: patient and wife REASON FOR VISIT: follow up   HISTORICAL  CHIEF COMPLAINT:  Chief Complaint  Patient presents with  . Follow-up    Myasthenia gravis     HISTORY OF PRESENT ILLNESS:   UPDATE 08/09/14: Since last visit, doing well. No new sxs. Stable on imuran and mestinon. No weakness or SOB.   UPDATE 02/07/14: Since last visit, MG is stable. No SOB, weakness, slurred speech or trouble swallowing. Has skin CA removed on leg (recurrent from 3 years ago). Has annual dermatology eval. Tolerating imuran 150mg  daily + mestinon 30mg  TID. Doing better with OSA and CPAP.  UPDATE 04/09/13: Continues to do well on imuran 150mg  daily. No side effects. Has been to PCP (Dr. Joylene Draft) for annual physical; PSA slightly elevated and has follow up. Patient has gained some weight (he thinks due to inactivity). Otherwise no concerns. No SOB, weakness, numbness.  UPDATE 12/29/12: Doing well on imuran 150mg  daily. No new sxs. No SOB. No weakness. Working in the yard.  UPDATE 11/06/12: Doing well on imuran 100mg  daily. No MG flares. Left ptosis is better. No mania or mood disturbances. Last labs were improved. Taking mestinon 30mg  TID. Sometimes has diarrhea, and then takes 15mg  dose.  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 He was kindly and expertly managed by medical and neurohospitalists, and tapered off prednisone, treated with IVIG, and then started on imuran. Doing well now. Mania is almost fully resolved. Still a bit "hyper" per wife, but much improved. Platelets trended down near end of hospital stay (last 120). Here for follow up labs as well.  UPDATE 09/10/12: Since last visit, doing much better on prednisone. Now on prednisone 60mg  daily. Taking calcium + Vit D, omeprazole. No side effects, except for slightly  "buzzy" mood. Sleep apnea better. Strength improved. Almost back to normal. Some left ptosis persists. Some chewing difficulty towards end of meals.  UPDATE 08/13/12: Since last visit, EMG and lab testing confirm myasthenia gravis diagnosis. I prescribed Mestinon for patient. He has had a positive response. He tried 30 mg 3 times a day, but now takes 15 mg 6 times a day, do to see increasing secretions and diarrhea. Medications lasting approximately 4 hours. No shortness of breath. Continues to have left ptosis, some neck weakness and left arm weakness. Some mild slurred speech. No trouble swallowing.  PRIOR HPI (08/06/12): 74 year old right-handed male with history of hypertension, seasonal allergies, here for evaluation of possible myasthenia gravis. 6 weeks ago patient noted left eyelid drooping. No double vision. Over the next few weeks he had increasing weakness in keeping his head upright as well as difficulty with talking and chewing. Since this past Saturday symptoms have been significantly worse. He denies any shortness of breath or choking problems. He has mild weakness in his arms and no weakness in his legs. No headache, confusion, chest pain. No similar symptoms in the past. Patient does have diagnosis of sleep apnea, but he has not used his CPAP machine last 2 or 3 years.  REVIEW OF SYSTEMS: Full 14 system review of systems performed and notable for incr activity env allergies; doing well on CPAP.   ALLERGIES: Allergies  Allergen Reactions  . Prednisone Other (See Comments)    manic  . Penicillins Rash  . Risperidone And Related Other (See Comments)    hyperactivity  HOME MEDICATIONS:  Outpatient Encounter Prescriptions as of 08/09/2014  Medication Sig  . aspirin EC 81 MG tablet Take 81 mg by mouth every morning.  Marland Kitchen azaTHIOprine (IMURAN) 50 MG tablet Take 3 tablets (150 mg total) by mouth daily.  . calcium-vitamin D (OSCAL WITH D) 500-200 MG-UNIT per tablet Take 1 tablet by  mouth 2 (two) times daily.  . cetirizine (ZYRTEC) 10 MG tablet Take 10 mg by mouth daily.  . cholecalciferol (VITAMIN D) 1000 UNITS tablet Take 500 Units by mouth daily.   . diphenhydrAMINE (BENADRYL) 25 mg capsule Take 25 mg by mouth daily as needed. For allergies  . Multiple Vitamin (MULTIVITAMIN WITH MINERALS) TABS Take 1 tablet by mouth daily.  Marland Kitchen pyridostigmine (MESTINON) 60 MG tablet Take 0.5 tablets (30 mg total) by mouth 3 (three) times daily.  . ramipril (ALTACE) 10 MG capsule Take 10 mg by mouth daily.  . [DISCONTINUED] azaTHIOprine (IMURAN) 50 MG tablet Take 3 tablets (150 mg total) by mouth daily.  . [DISCONTINUED] omeprazole (PRILOSEC) 20 MG capsule Take 20 mg by mouth every morning.   . [DISCONTINUED] pyridostigmine (MESTINON) 60 MG tablet TAKE 1/2 TABLET BY MOUTH EVERY 8 HOURS  . [DISCONTINUED] imiquimod (ALDARA) 5 % cream Apply 1 application topically daily.     PAST MEDICAL HISTORY: Past Medical History  Diagnosis Date  . Sleep apnea   . Hypertension   . Squamous carcinoma   . Myasthenia gravis   . GERD (gastroesophageal reflux disease)     PAST SURGICAL HISTORY: Past Surgical History  Procedure Laterality Date  . Skin cancer removal      FAMILY HISTORY: Family History  Problem Relation Age of Onset  . Heart disease Mother   . Cancer - Other Father   . Heart disease Sister   . Heart disease Brother   . Bipolar disorder Sister   . Bipolar disorder Sister     suicide  . Healthy Brother     SOCIAL HISTORY:  History   Social History  . Marital Status: Married    Spouse Name: Maureen Duesing  . Number of Children: 3  . Years of Education: MD   Occupational History  . Retired     Radiology MD   Social History Main Topics  . Smoking status: Never Smoker   . Smokeless tobacco: Never Used  . Alcohol Use: Yes     Comment: seldom  . Drug Use: No  . Sexual Activity: Not on file   Other Topics Concern  . Not on file   Social History  Narrative   Patient is married ([name]) and lives at home with his wife.   Patient has three adult children.   Patient is retired, Stage manager.   Married.  Ambulates unassisted.   Caffeine Use- 4 cups daily.   Patient is right-handed.   Patient has a college education.     PHYSICAL EXAM  Filed Vitals:   08/09/14 0954  BP: 136/80  Pulse: 63  Height: 5\' 10"  (1.778 m)  Weight: 202 lb 11.2 oz (91.944 kg)   Body mass index is 29.08 kg/(m^2).  GENERAL EXAM: Patient is in no distress  CARDIOVASCULAR: Regular rate and rhythm, no murmurs, no carotid bruits  NEUROLOGIC: MENTAL STATUS: awake, alert, language fluent, comprehension intact, naming intact CRANIAL NERVE: pupils equal and reactive to light, visual fields full to confrontation, extraocular muscles intact, no nystagmus, facial sensation and strength symmetric, uvula midline, shoulder shrug symmetric, tongue midline. SUBTLE LEFT PTOSIS.  MOTOR: NORMAL  TONE; 5/5 IN BUE AND BLE.  SENSORY: normal and symmetric to light touch and vibration COORDINATION: finger-nose-finger, fine finger movements normal REFLEXES: deep tendon reflexes present and symmetric; TRACE AT ANKLES. GAIT/STATION: narrow based gait   DIAGNOSTIC DATA (LABS, IMAGING, TESTING) - I reviewed patient records, labs, notes, testing and imaging myself where available.  Lab Results  Component Value Date   WBC 4.7 08/03/2014   HGB 13.9 08/03/2014   HCT 40.7 08/03/2014   MCV 98* 08/03/2014   PLT 180 08/03/2014   Lab Results  Component Value Date   CREATININE 1.31* 08/03/2014   BUN 22 08/03/2014   NA 140 08/03/2014   K 4.8 08/03/2014   CL 102 08/03/2014   CO2 22 08/03/2014   Lab Results  Component Value Date   ALT 17 08/03/2014   AST 20 08/03/2014   ALKPHOS 50 08/03/2014   BILITOT 0.6 08/03/2014     08/05/12 AchR binding 33.80 (h), AchR blocking 36% (h), AchR modulating 38% (h), anti-striational (1:80 (h)  08/05/12 B12 643, TSH 4.07, FT4 1.1,  BUN/Cr 21/1.7  08/06/12 CT chest - no thymoma, small nodules within the thyroid gland are likely benign, left lower lobe pulmonary nodule, Bosniak I cyst of the right kidney is partially imaged, coronary artery calcifications.  08/06/12 EMG/NCS  1. Decremental response on repetitive nerve stimulation study of greater than 30%, consistent with neuromuscular junction disorder such as myasthenia gravis. 2. No evidence of underlying myopathy or motor neuropathy. 3. Decreased motor unit recruitment of the left flexor carpi radialis muscle may indicate underlying cervical radiculopathy (C6, C7), but not confirmed on paraspinal muscle testing or nerve conduction studies.  01/21/14 CBC (WBC 4.5, Plt 207, LFTs normal)   ASSESSMENT AND PLAN  74 y.o. year old male  has a past medical history of Sleep apnea; Hypertension; Squamous carcinoma; Myasthenia gravis; and GERD (gastroesophageal reflux disease). here with myasthenia gravis based on history and exam, EMG and lab testing. Did well with mestinon and prednisone until he developed steroid psychosis/mania. Now transitioned to imuran and doing well since July 2014.  Problem List:  Myasthenia gravis - stable  Steroid psychosis/mania - RESOLVED  Thrombocytopenia - RESOLVED   PLAN: 1. Continue pyridostigmine 30 mg TID 2. Continue imuran 150mg  daily 3. Continue CBC, CMP monitoring (for imuran) q74months  Return in about 6 months (around 02/08/2015).    Penni Bombard, MD 0/98/1191, 47:82 AM Certified in Neurology, Neurophysiology and Neuroimaging  [facility] [address] [place], [place] [address] [phone]

## 2014-08-09 NOTE — Patient Instructions (Signed)
Continue current medications. 

## 2014-08-10 DIAGNOSIS — R972 Elevated prostate specific antigen [PSA]: Secondary | ICD-10-CM | POA: Diagnosis not present

## 2014-08-10 DIAGNOSIS — J302 Other seasonal allergic rhinitis: Secondary | ICD-10-CM | POA: Diagnosis not present

## 2014-08-10 DIAGNOSIS — G4730 Sleep apnea, unspecified: Secondary | ICD-10-CM | POA: Diagnosis not present

## 2014-08-10 DIAGNOSIS — I10 Essential (primary) hypertension: Secondary | ICD-10-CM | POA: Diagnosis not present

## 2014-08-10 DIAGNOSIS — K219 Gastro-esophageal reflux disease without esophagitis: Secondary | ICD-10-CM | POA: Diagnosis not present

## 2014-08-10 DIAGNOSIS — G7000 Myasthenia gravis without (acute) exacerbation: Secondary | ICD-10-CM | POA: Diagnosis not present

## 2014-08-10 DIAGNOSIS — R946 Abnormal results of thyroid function studies: Secondary | ICD-10-CM | POA: Diagnosis not present

## 2014-08-10 DIAGNOSIS — Z6826 Body mass index (BMI) 26.0-26.9, adult: Secondary | ICD-10-CM | POA: Diagnosis not present

## 2014-11-17 DIAGNOSIS — Z0100 Encounter for examination of eyes and vision without abnormal findings: Secondary | ICD-10-CM | POA: Diagnosis not present

## 2014-11-17 DIAGNOSIS — H2513 Age-related nuclear cataract, bilateral: Secondary | ICD-10-CM | POA: Diagnosis not present

## 2014-11-21 DIAGNOSIS — R972 Elevated prostate specific antigen [PSA]: Secondary | ICD-10-CM | POA: Diagnosis not present

## 2014-11-21 DIAGNOSIS — N400 Enlarged prostate without lower urinary tract symptoms: Secondary | ICD-10-CM | POA: Diagnosis not present

## 2015-01-25 DIAGNOSIS — Z125 Encounter for screening for malignant neoplasm of prostate: Secondary | ICD-10-CM | POA: Diagnosis not present

## 2015-01-25 DIAGNOSIS — Z0000 Encounter for general adult medical examination without abnormal findings: Secondary | ICD-10-CM | POA: Diagnosis not present

## 2015-01-25 DIAGNOSIS — E785 Hyperlipidemia, unspecified: Secondary | ICD-10-CM | POA: Diagnosis not present

## 2015-01-25 DIAGNOSIS — I10 Essential (primary) hypertension: Secondary | ICD-10-CM | POA: Diagnosis not present

## 2015-01-31 DIAGNOSIS — Z6828 Body mass index (BMI) 28.0-28.9, adult: Secondary | ICD-10-CM | POA: Diagnosis not present

## 2015-01-31 DIAGNOSIS — Z0000 Encounter for general adult medical examination without abnormal findings: Secondary | ICD-10-CM | POA: Diagnosis not present

## 2015-01-31 DIAGNOSIS — R3129 Other microscopic hematuria: Secondary | ICD-10-CM | POA: Diagnosis not present

## 2015-01-31 DIAGNOSIS — R946 Abnormal results of thyroid function studies: Secondary | ICD-10-CM | POA: Diagnosis not present

## 2015-01-31 DIAGNOSIS — K635 Polyp of colon: Secondary | ICD-10-CM | POA: Diagnosis not present

## 2015-01-31 DIAGNOSIS — E785 Hyperlipidemia, unspecified: Secondary | ICD-10-CM | POA: Diagnosis not present

## 2015-01-31 DIAGNOSIS — Z1389 Encounter for screening for other disorder: Secondary | ICD-10-CM | POA: Diagnosis not present

## 2015-01-31 DIAGNOSIS — I10 Essential (primary) hypertension: Secondary | ICD-10-CM | POA: Diagnosis not present

## 2015-01-31 DIAGNOSIS — G7000 Myasthenia gravis without (acute) exacerbation: Secondary | ICD-10-CM | POA: Diagnosis not present

## 2015-01-31 DIAGNOSIS — L309 Dermatitis, unspecified: Secondary | ICD-10-CM | POA: Diagnosis not present

## 2015-01-31 DIAGNOSIS — D7589 Other specified diseases of blood and blood-forming organs: Secondary | ICD-10-CM | POA: Diagnosis not present

## 2015-01-31 DIAGNOSIS — Z23 Encounter for immunization: Secondary | ICD-10-CM | POA: Diagnosis not present

## 2015-02-08 ENCOUNTER — Encounter: Payer: Self-pay | Admitting: Cardiology

## 2015-02-08 ENCOUNTER — Encounter: Payer: Self-pay | Admitting: Diagnostic Neuroimaging

## 2015-02-08 ENCOUNTER — Ambulatory Visit (INDEPENDENT_AMBULATORY_CARE_PROVIDER_SITE_OTHER): Payer: Medicare Other | Admitting: Diagnostic Neuroimaging

## 2015-02-08 VITALS — BP 157/81 | HR 47 | Ht 70.0 in | Wt 202.0 lb

## 2015-02-08 DIAGNOSIS — Z1212 Encounter for screening for malignant neoplasm of rectum: Secondary | ICD-10-CM | POA: Diagnosis not present

## 2015-02-08 DIAGNOSIS — G7000 Myasthenia gravis without (acute) exacerbation: Secondary | ICD-10-CM

## 2015-02-08 NOTE — Progress Notes (Signed)
[facility]  PATIENT: Oscar Jacobs DOB: 03-24-1941  REFERRING CLINICIAN:  HISTORY FROM: patient and wife REASON FOR VISIT: follow up   HISTORICAL  CHIEF COMPLAINT:  Chief Complaint  Patient presents with  . Myasthenia gravis    rm 6 , wife - Oscar Jacobs  . Follow-up    6 months     HISTORY OF PRESENT ILLNESS:   UPDATE 02/08/15: Since last visit, doing well. No new events. Tolerating imuran and mestinon. Today has some low heart rate (not normal) and irregular rhythm. No chest pain or shortness of breath. Family hx of cardiac arrhythmia in brother and brother's son.   UPDATE 08/09/14: Since last visit, doing well. No new sxs. Stable on imuran and mestinon. No weakness or SOB.   UPDATE 02/07/14: Since last visit, MG is stable. No SOB, weakness, slurred speech or trouble swallowing. Has skin CA removed on leg (recurrent from 3 years ago). Has annual dermatology eval. Tolerating imuran 150mg  daily + mestinon 30mg  TID. Doing better with OSA and CPAP.  UPDATE 04/09/13: Continues to do well on imuran 150mg  daily. No side effects. Has been to PCP (Oscar Jacobs) for annual physical; PSA slightly elevated and has follow up. Patient has gained some weight (he thinks due to inactivity). Otherwise no concerns. No SOB, weakness, numbness.  UPDATE 12/29/12: Doing well on imuran 150mg  daily. No new sxs. No SOB. No weakness. Working in the yard.  UPDATE 11/06/12: Doing well on imuran 100mg  daily. No MG flares. Left ptosis is better. No mania or mood disturbances. Last labs were improved. Taking mestinon 30mg  TID. Sometimes has diarrhea, and then takes 15mg  dose.  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 He was kindly and expertly managed by medical and neurohospitalists, and tapered off prednisone, treated with IVIG, and then started on imuran. Doing well now. Mania is almost fully resolved. Still a bit  "hyper" per wife, but much improved. Platelets trended down near end of hospital stay (last 120). Here for follow up labs as well.  UPDATE 09/10/12: Since last visit, doing much better on prednisone. Now on prednisone 60mg  daily. Taking calcium + Vit D, omeprazole. No side effects, except for slightly "buzzy" mood. Sleep apnea better. Strength improved. Almost back to normal. Some left ptosis persists. Some chewing difficulty towards end of meals.  UPDATE 08/13/12: Since last visit, EMG and lab testing confirm myasthenia gravis diagnosis. I prescribed Mestinon for patient. He has had a positive response. He tried 30 mg 3 times a day, but now takes 15 mg 6 times a day, do to see increasing secretions and diarrhea. Medications lasting approximately 4 hours. No shortness of breath. Continues to have left ptosis, some neck weakness and left arm weakness. Some mild slurred speech. No trouble swallowing.  PRIOR HPI (08/06/12): 74 year old right-handed male with history of hypertension, seasonal allergies, here for evaluation of possible myasthenia gravis. 6 weeks ago patient noted left eyelid drooping. No double vision. Over the next few weeks he had increasing weakness in keeping his head upright as well as difficulty with talking and chewing. Since this past Saturday symptoms have been significantly worse. He denies any shortness of breath or choking problems. He has mild weakness in his arms and no weakness in his legs. No headache, confusion, chest pain. No similar symptoms in the past. Patient does have diagnosis of sleep apnea, but he has not used his CPAP machine last 2 or 3 years.  REVIEW OF SYSTEMS: Full 14 system review of systems performed and notable for runny nose.    ALLERGIES: Allergies  Allergen Reactions  . Prednisone Other (See Comments)    manic  . Penicillins Rash  . Risperidone And Related Other (See Comments)    hyperactivity    HOME MEDICATIONS:  Outpatient Encounter  Prescriptions as of 02/08/2015  Medication Sig  . aspirin EC 81 MG tablet Take 81 mg by mouth every morning.  Marland Kitchen azaTHIOprine (IMURAN) 50 MG tablet Take 3 tablets (150 mg total) by mouth daily.  . calcium-vitamin D (OSCAL WITH D) 500-200 MG-UNIT per tablet Take 1 tablet by mouth 2 (two) times daily.  . cetirizine (ZYRTEC) 10 MG tablet Take 10 mg by mouth daily.  . cholecalciferol (VITAMIN D) 1000 UNITS tablet Take 500 Units by mouth daily.   . Cyanocobalamin (VITAMIN B-12 CR PO) Take 1,250 mg by mouth daily.  . Multiple Vitamin (MULTIVITAMIN WITH MINERALS) TABS Take 1 tablet by mouth daily.  Marland Kitchen pyridostigmine (MESTINON) 60 MG tablet Take 0.5 tablets (30 mg total) by mouth 3 (three) times daily.  . ramipril (ALTACE) 10 MG capsule Take 10 mg by mouth daily.  . [DISCONTINUED] diphenhydrAMINE (BENADRYL) 25 mg capsule Take 25 mg by mouth daily as needed. For allergies   No facility-administered encounter medications on file as of 02/08/2015.     PAST MEDICAL HISTORY: Past Medical History  Diagnosis Date  . Sleep apnea   . Hypertension   . Squamous carcinoma ([place])   . Myasthenia gravis ([place])   . GERD (gastroesophageal reflux disease)     PAST SURGICAL HISTORY: Past Surgical History  Procedure Laterality Date  . Skin cancer removal      FAMILY HISTORY: Family History  Problem Relation Age of Onset  . Heart disease Mother   . Cancer - Other Father   . Heart disease Sister   . Heart disease Brother   . Bipolar disorder Sister   . Bipolar disorder Sister     suicide  . Healthy Brother     SOCIAL HISTORY:  Social History   Social History  . Marital Status: Married    Spouse Name: Oscar Jacobs  . Number of Children: 3  . Years of Education: MD   Occupational History  . Retired     Radiology MD   Social History Main Topics  . Smoking status: Never Smoker   . Smokeless tobacco: Never Used  . Alcohol Use: Yes     Comment: seldom  . Drug Use: No  . Sexual  Activity: Not on file   Other Topics Concern  . Not on file   Social History Narrative   Patient is married (Oscar Jacobs) and lives at home with his wife.   Patient has three adult children.   Patient is retired, Stage manager.   Married.  Ambulates unassisted.   Caffeine Use- 4 cups daily.   Patient is right-handed.   Patient has a college education.     PHYSICAL EXAM  Filed Vitals:   02/08/15 1006 02/08/15 1014  BP: 127/63 157/81  Pulse: 44 47  Height: 5\' 10"  (1.778 m)   Weight: 202 lb (91.627 kg)    Body mass index is 28.98 kg/(m^2).  GENERAL EXAM: Patient is in no distress  CARDIOVASCULAR: BRADYCARDIA; IRREGULAR RHYTHM; no murmurs, no carotid bruits  NEUROLOGIC: MENTAL STATUS: awake, alert, language fluent, comprehension intact, naming intact CRANIAL NERVE: pupils equal and reactive to light, visual fields full to confrontation, extraocular muscles  intact, no nystagmus, facial sensation and strength symmetric, uvula midline, shoulder shrug symmetric, tongue midline. SUBTLE LEFT PTOSIS.  MOTOR: NORMAL TONE; 5/5 IN BUE AND BLE.  SENSORY: normal and symmetric to light touch and vibration COORDINATION: finger-nose-finger, fine finger movements normal REFLEXES: deep tendon reflexes present and symmetric; TRACE AT ANKLES. GAIT/STATION: narrow based gait   DIAGNOSTIC DATA (LABS, IMAGING, TESTING) - I reviewed patient records, labs, notes, testing and imaging myself where available.  Lab Results  Component Value Date   WBC 4.7 08/03/2014   HGB 13.9 08/03/2014   HCT 40.7 08/03/2014   MCV 98* 08/03/2014   PLT 180 08/03/2014   Lab Results  Component Value Date   CREATININE 1.31* 08/03/2014   BUN 22 08/03/2014   NA 140 08/03/2014   K 4.8 08/03/2014   CL 102 08/03/2014   CO2 22 08/03/2014   Lab Results  Component Value Date   ALT 17 08/03/2014   AST 20 08/03/2014   ALKPHOS 50 08/03/2014   BILITOT 0.6 08/03/2014     08/05/12 AchR binding 33.80 (h), AchR  blocking 36% (h), AchR modulating 38% (h), anti-striational (1:80 (h)  08/05/12 B12 643, TSH 4.07, FT4 1.1, BUN/Cr 21/1.7  08/06/12 CT chest - no thymoma, small nodules within the thyroid gland are likely benign, left lower lobe pulmonary nodule, Bosniak I cyst of the right kidney is partially imaged, coronary artery calcifications.  08/06/12 EMG/NCS  1. Decremental response on repetitive nerve stimulation study of greater than 30%, consistent with neuromuscular junction disorder such as myasthenia gravis. 2. No evidence of underlying myopathy or motor neuropathy. 3. Decreased motor unit recruitment of the left flexor carpi radialis muscle may indicate underlying cervical radiculopathy (C6, C7), but not confirmed on paraspinal muscle testing or nerve conduction studies.  01/21/14 CBC (WBC 4.5, Plt 207, LFTs normal)   ASSESSMENT AND PLAN  74 y.o. year old male  has a past medical history of Sleep apnea; Hypertension; Squamous carcinoma ([place]); Myasthenia gravis ([place]); and GERD (gastroesophageal reflux disease). here with myasthenia gravis based on history and exam, EMG and lab testing. Did well with mestinon and prednisone until he developed steroid psychosis/mania. Now transitioned to imuran and doing well since July 2014.  Problem List:  Myasthenia gravis - stable  Steroid psychosis/mania - RESOLVED  Thrombocytopenia - RESOLVED  Bradycardia and irregular heart rate - NEW PROBLEM, NEEDS WORKUP   PLAN: 1. Continue pyridostigmine 30 mg TID 2. Continue imuran 150mg  daily 3. Continue CBC, CMP monitoring (for imuran) q90months 4. Follow up with PCP re: bradycardia and irregular rhythm --> (EKG in clinic shows sinus bradycardia with occasional PVCs; copies given to patient and he will follow up with Oscar Jacobs)  Return in about 6 months (around 08/09/2015).    Penni Bombard, MD 62/37/6283, 15:17 AM Certified in Neurology, Neurophysiology and Neuroimaging  [facility] [address] [place], [place] [address] [phone]

## 2015-02-08 NOTE — Patient Instructions (Signed)
Thank you for coming to see [place] at [facility]. I hope we have been able to provide you high quality care today.  You may receive a patient satisfaction survey over the next few weeks. We would appreciate your feedback and comments so that we may continue to improve ourselves and the health of our patients.  - continue imuran and pyridostigmine - follow up with Dr. Joylene Draft re: bradycardia and irregular rhythm   ~~~~~~~~~~~~~~~~~~~~~~~~~~~~~~~~~~~~~~~~~~~~~~~~~~~~~~~~~~~~~~~~~  DR. PENUMALLI'S GUIDE TO HAPPY AND HEALTHY LIVING These are some of my general health and wellness recommendations. Some of them may apply to you better than others. Please use common sense as you try these suggestions and feel free to ask me any questions.   ACTIVITY/FITNESS Mental, social, emotional and physical stimulation are very important for brain and body health. Try learning a new activity (arts, music, language, sports, games).  Keep moving your body to the best of your abilities. You can do this at home, inside or outside, the park, community center, gym or anywhere you like. Consider a physical therapist or personal trainer to get started. Consider the app Sworkit. Fitness trackers such as smart-watches, smart-phones or Fitbits can help as well.   NUTRITION Eat more plants: colorful vegetables, nuts, seeds and berries.  Eat less sugar, salt, preservatives and processed foods.  Avoid toxins such as cigarettes and alcohol.  Drink water when you are thirsty. Warm water with a slice of lemon is an excellent morning drink to start the day.  Consider these websites for more information The Nutrition Source (https://www.henry-hernandez.biz/) Precision Nutrition (WindowBlog.ch)   RELAXATION Consider practicing mindfulness meditation or other relaxation techniques such as deep breathing, prayer, yoga, tai chi, massage. See website mindful.org or  the apps Headspace or Calm to help get started.   SLEEP Try to get at least 7-8+ hours sleep per day. Regular exercise and reduced caffeine will help you sleep better. Practice good sleep hygeine techniques. See website sleep.org for more information.   PLANNING Prepare estate planning, living will, healthcare POA documents. Sometimes this is best planned with the help of an attorney. Theconversationproject.org and agingwithdignity.org are excellent resources.

## 2015-02-15 ENCOUNTER — Ambulatory Visit (INDEPENDENT_AMBULATORY_CARE_PROVIDER_SITE_OTHER): Payer: Medicare Other | Admitting: Cardiology

## 2015-02-15 ENCOUNTER — Encounter: Payer: Self-pay | Admitting: Cardiology

## 2015-02-15 VITALS — BP 142/80 | HR 72 | Ht 70.0 in | Wt 203.0 lb

## 2015-02-15 DIAGNOSIS — I10 Essential (primary) hypertension: Secondary | ICD-10-CM

## 2015-02-15 DIAGNOSIS — R001 Bradycardia, unspecified: Secondary | ICD-10-CM | POA: Insufficient documentation

## 2015-02-15 NOTE — Progress Notes (Signed)
Patient ID: Oscar Jacobs, male   DOB: July 30, 1940, 74 y.o.   MRN: [id number]      Cardiology Office Note   Date:  02/15/2015   ID:  [name], DOB 10/16/1940, MRN [id number]  PCP:  Jerlyn Ly, MD  Cardiologist: Dorothy Spark, MD   No chief complaint on file.  History of Present Illness:  Oscar Jacobs is a 74 y.o. male who presents for evaluation of bradycardia. 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 He is generally very healthy and active. He was seen by his PCP Dr Joylene Draft who found that he is in sinus bradycardia with HR down to 44 BPM. He denies any symptoms like dizziness, presyncope, syncope. No falls. He also denies chest pain, DOE, palpitations.  He has no h/o DM, controlled hypertension and no hyperlipidemia. He has OSA on CPAP at night.   Past Medical History  Diagnosis Date  . Sleep apnea   . Hypertension   . Squamous carcinoma ([place])   . Myasthenia gravis ([place])   . GERD (gastroesophageal reflux disease)    Past Surgical History  Procedure Laterality Date  . Skin cancer removal     Current Outpatient Prescriptions  Medication Sig Dispense Refill  . aspirin EC 81 MG tablet Take 81 mg by mouth every morning.    Marland Kitchen azaTHIOprine (IMURAN) 50 MG tablet Take 3 tablets (150 mg total) by mouth daily. 270 tablet 4  . calcium-vitamin D (OSCAL WITH D) 500-200 MG-UNIT per tablet Take 1 tablet by mouth 2 (two) times daily.    . cetirizine (ZYRTEC) 10 MG tablet Take 10 mg by mouth daily.    . cholecalciferol (VITAMIN D) 1000 UNITS tablet Take 500 Units by mouth daily.     . Cyanocobalamin (VITAMIN B-12 CR PO) Take 1,250 mg by mouth daily.    . Multiple Vitamin (MULTIVITAMIN WITH MINERALS) TABS Take 1 tablet by mouth daily.    Marland Kitchen pyridostigmine (MESTINON) 60 MG tablet Take 0.5 tablets (30 mg total) by mouth 3 (three) times daily. 180 tablet 4  . ramipril (ALTACE) 10 MG capsule Take 10 mg by mouth daily.     No current  facility-administered medications for this visit.   Allergies:   Prednisone; Penicillins; and Risperidone and related   Social History:  The patient  reports that he has never smoked. He has never used smokeless tobacco. He reports that he drinks alcohol. He reports that he does not use illicit drugs.   Family History:  The patient's family history includes Bipolar disorder in his sister and sister; Cancer - Other in his father; Healthy in his brother; Heart disease in his brother, mother, and sister.   ROS:  Please see the history of present illness.   Otherwise, review of systems are positive for none.   All other systems are reviewed and negative.   PHYSICAL EXAM: VS:  BP 142/80 mmHg  Pulse 72  Ht 5\' 10"  (1.778 m)  Wt 203 lb (92.08 kg)  BMI 29.13 kg/m2 , BMI Body mass index is 29.13 kg/(m^2). GEN: Well nourished, well developed, in no acute distress HEENT: normal Neck: no JVD, carotid bruits, or masses Cardiac: RRR; no murmurs, rubs, or gallops,no edema  Respiratory:  clear to auscultation bilaterally, normal work of breathing GI: soft, nontender, nondistended, + BS MS: no deformity or atrophy Skin: warm and dry, no rash Neuro:  Strength and sensation are intact Psych: euthymic mood, full affect  EKG: Sinus bradycardia, otherwise normal  ECG  Recent Labs: 08/03/2014: ALT 17; BUN 22; Creatinine, Ser 1.31*; Hemoglobin 13.9; Platelets 180; Potassium 4.8; Sodium 140   Lipid Panel No results found for: CHOL, TRIG, HDL, CHOLHDL, VLDL, LDLCALC, LDLDIRECT    Wt Readings from Last 3 Encounters:  02/15/15 203 lb (92.08 kg)  02/08/15 202 lb (91.627 kg)  08/09/14 202 lb 11.2 oz (91.944 kg)      ASSESSMENT AND PLAN:  1.  Sinus bradycardia - there is no sign of 1., 2., or 3.degree AVB on his ECG, no pauses or signs of conduction delay. We will schedule a 24 hour Holter monitor to evaluate for possible blocks during the day/night. Pyridostigmine can cause the bradycardia, since the  benefit outweights the risk in an asymptomatic patient I would just continue the same dose right now.   We will also order an echocardiogram to evaluate the anatomy since he never had any done before.   If all of these test are normal, and there is no worrisome advanced AVB we will just follow in 1 year since he is asymptomatic.  2. Hypertension - controlled.    Dorothy Spark, MD  02/15/2015 10:52 AM    [facility] [place], [place], [place]  [address] Phone: [phone]; Fax: [phone]

## 2015-02-15 NOTE — Patient Instructions (Signed)
Medication Instructions:   Your physician recommends that you continue on your current medications as directed. Please refer to the Current Medication list given to you today.      Testing/Procedures:  Your physician has requested that you have an echocardiogram. Echocardiography is a painless test that uses sound waves to create images of your heart. It provides your doctor with information about the size and shape of your heart and how well your heart's chambers and valves are working. This procedure takes approximately one hour. There are no restrictions for this procedure.   Your physician has recommended that you wear a 24 hour holter monitor. Holter monitors are medical devices that record the heart's electrical activity. Doctors most often use these monitors to diagnose arrhythmias. Arrhythmias are problems with the speed or rhythm of the heartbeat. The monitor is a small, portable device. You can wear one while you do your normal daily activities. This is usually used to diagnose what is causing palpitations/syncope (passing out).    Follow-Up:  Your physician wants you to follow-up in: [place] will receive a reminder letter in the mail two months in advance. If you don't receive a letter, please call our office to schedule the follow-up appointment.

## 2015-02-20 DIAGNOSIS — Z1212 Encounter for screening for malignant neoplasm of rectum: Secondary | ICD-10-CM | POA: Diagnosis not present

## 2015-02-20 DIAGNOSIS — Z1211 Encounter for screening for malignant neoplasm of colon: Secondary | ICD-10-CM | POA: Diagnosis not present

## 2015-02-21 ENCOUNTER — Other Ambulatory Visit: Payer: Self-pay

## 2015-02-21 ENCOUNTER — Ambulatory Visit (HOSPITAL_COMMUNITY): Payer: Medicare Other | Attending: Internal Medicine

## 2015-02-21 ENCOUNTER — Ambulatory Visit (INDEPENDENT_AMBULATORY_CARE_PROVIDER_SITE_OTHER): Payer: Medicare Other

## 2015-02-21 DIAGNOSIS — I517 Cardiomegaly: Secondary | ICD-10-CM | POA: Insufficient documentation

## 2015-02-21 DIAGNOSIS — I5189 Other ill-defined heart diseases: Secondary | ICD-10-CM | POA: Insufficient documentation

## 2015-02-21 DIAGNOSIS — R001 Bradycardia, unspecified: Secondary | ICD-10-CM | POA: Diagnosis not present

## 2015-02-21 DIAGNOSIS — I10 Essential (primary) hypertension: Secondary | ICD-10-CM | POA: Diagnosis not present

## 2015-02-21 DIAGNOSIS — I071 Rheumatic tricuspid insufficiency: Secondary | ICD-10-CM | POA: Diagnosis not present

## 2015-02-24 ENCOUNTER — Telehealth: Payer: Self-pay | Admitting: *Deleted

## 2015-02-24 DIAGNOSIS — I493 Ventricular premature depolarization: Secondary | ICD-10-CM

## 2015-02-24 DIAGNOSIS — R001 Bradycardia, unspecified: Secondary | ICD-10-CM

## 2015-02-24 DIAGNOSIS — R0609 Other forms of dyspnea: Secondary | ICD-10-CM

## 2015-02-24 DIAGNOSIS — I491 Atrial premature depolarization: Secondary | ICD-10-CM

## 2015-02-24 DIAGNOSIS — I10 Essential (primary) hypertension: Secondary | ICD-10-CM

## 2015-02-24 DIAGNOSIS — I471 Supraventricular tachycardia: Secondary | ICD-10-CM

## 2015-02-24 NOTE — Telephone Encounter (Signed)
Spoke to pt and advised him of new monitor results and new orders per Dr. Meda Coffee. Advised pt that our office would call on Monday with instructions for test and to schedule test. Pt verbalized understanding and was in agreement with this plan.

## 2015-02-24 NOTE — Telephone Encounter (Signed)
-----   Message from Dorothy Spark, MD sent at 02/24/2015  4:04 PM EDT ----- I would suggest an ischemic workup considering the frequency of ventricular ectopy.  Please schedule an exercise nuclear stress test.

## 2015-02-27 NOTE — Telephone Encounter (Signed)
Spoke with pt and went over instructions for nuclear stress test. Pt verbalized understanding.

## 2015-02-28 ENCOUNTER — Encounter: Payer: Self-pay | Admitting: Cardiology

## 2015-03-01 ENCOUNTER — Ambulatory Visit (HOSPITAL_COMMUNITY): Payer: Medicare Other | Attending: Cardiology

## 2015-03-01 DIAGNOSIS — I493 Ventricular premature depolarization: Secondary | ICD-10-CM

## 2015-03-01 DIAGNOSIS — I4719 Other supraventricular tachycardia: Secondary | ICD-10-CM

## 2015-03-01 DIAGNOSIS — Z8249 Family history of ischemic heart disease and other diseases of the circulatory system: Secondary | ICD-10-CM | POA: Diagnosis not present

## 2015-03-01 DIAGNOSIS — R9439 Abnormal result of other cardiovascular function study: Secondary | ICD-10-CM | POA: Diagnosis not present

## 2015-03-01 DIAGNOSIS — I471 Supraventricular tachycardia: Secondary | ICD-10-CM | POA: Insufficient documentation

## 2015-03-01 DIAGNOSIS — I491 Atrial premature depolarization: Secondary | ICD-10-CM

## 2015-03-01 LAB — MYOCARDIAL PERFUSION IMAGING
Estimated workload: 7 METS
Exercise duration (min): 6 min
Exercise duration (sec): 0 s
LV dias vol: 102 mL
LV sys vol: 41 mL
MPHR: 145 {beats}/min
Peak HR: 129 {beats}/min
Percent HR: 88 %
RATE: 0.34
RPE: 17
Rest HR: 75 {beats}/min
SDS: 1
SRS: 5
SSS: 6
TID: 0.94

## 2015-03-01 MED ORDER — TECHNETIUM TC 99M SESTAMIBI GENERIC - CARDIOLITE
10.3000 | Freq: Once | INTRAVENOUS | Status: AC | PRN
  Administered 2015-03-01: 10 via INTRAVENOUS

## 2015-03-01 MED ORDER — TECHNETIUM TC 99M SESTAMIBI GENERIC - CARDIOLITE
30.4000 | Freq: Once | INTRAVENOUS | Status: AC | PRN
  Administered 2015-03-01: 30 via INTRAVENOUS

## 2015-03-02 ENCOUNTER — Telehealth: Payer: Self-pay | Admitting: *Deleted

## 2015-03-02 DIAGNOSIS — I491 Atrial premature depolarization: Secondary | ICD-10-CM | POA: Insufficient documentation

## 2015-03-02 NOTE — Telephone Encounter (Signed)
Dr Meda Coffee contacted this pt today via phone and went over his stress test results with him via phone.  Dr Meda Coffee recommends that the pt have a follow-up appt with her in one year with a 24 hour holter monitor scheduled prior to that.  Pt is aware of Dr Francesca Oman recommendations. Will send a staff message to [facility] schedulers to recall and schedule a 1 year follow-up appt with Dr Meda Coffee, with a 24 hour holter monitor prior to that.

## 2015-03-02 NOTE — Telephone Encounter (Signed)
-----   Message from Dorothy Spark, MD sent at 03/02/2015  4:53 PM EDT ----- I talked to him, please schedule follow up in 1 year with a 24 hour Holter prior to that appointment.

## 2015-03-14 NOTE — Progress Notes (Signed)
Office visit note from 05/31/2014 with Dr. Brett Fairy faxed to [facility] for insurance purposes.

## 2015-04-06 DIAGNOSIS — K5730 Diverticulosis of large intestine without perforation or abscess without bleeding: Secondary | ICD-10-CM | POA: Diagnosis not present

## 2015-04-06 DIAGNOSIS — K648 Other hemorrhoids: Secondary | ICD-10-CM | POA: Diagnosis not present

## 2015-04-06 DIAGNOSIS — R195 Other fecal abnormalities: Secondary | ICD-10-CM | POA: Diagnosis not present

## 2015-04-06 DIAGNOSIS — Z1211 Encounter for screening for malignant neoplasm of colon: Secondary | ICD-10-CM | POA: Diagnosis not present

## 2015-04-06 DIAGNOSIS — K641 Second degree hemorrhoids: Secondary | ICD-10-CM | POA: Diagnosis not present

## 2015-04-06 DIAGNOSIS — Z86010 Personal history of colonic polyps: Secondary | ICD-10-CM | POA: Diagnosis not present

## 2015-06-01 ENCOUNTER — Encounter: Payer: Self-pay | Admitting: Neurology

## 2015-06-01 ENCOUNTER — Ambulatory Visit (INDEPENDENT_AMBULATORY_CARE_PROVIDER_SITE_OTHER): Payer: Medicare Other | Admitting: Neurology

## 2015-06-01 VITALS — BP 152/72 | HR 66 | Resp 20 | Ht 69.0 in | Wt 203.0 lb

## 2015-06-01 DIAGNOSIS — G4733 Obstructive sleep apnea (adult) (pediatric): Secondary | ICD-10-CM

## 2015-06-01 DIAGNOSIS — Z9989 Dependence on other enabling machines and devices: Principal | ICD-10-CM

## 2015-06-01 NOTE — Progress Notes (Signed)
[facility]  PATIENT: Oscar Jacobs DOB: [birth year]  REFERRING CLINICIAN:  HISTORY FROM: patient and wife REASON FOR VISIT: follow up on CPAP compliance   HISTORICAL  CHIEF COMPLAINT:  Chief Complaint  Patient presents with  . 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 This study was performed on 11-24-13. The patient at the time had an AHI of 64.8, an RDI of 66.0 disconsolate severe sleep apnea. He didn't have any periodic limb movements and device majority of his arousals with respiratory cost. Cardiac heart rate was very irregular between 54 and 77 beats per minute with variable R. to R interval's. It appears that this patient had sinus tachycardia and some wide complex tachycardia during sleep.  Atrial fibrillation was found, a condition that he shares with his son, paternal uncle.  The patient was titrated beginning at 5 cm water pressure to 10 cm water pressure even when reaching tandem his AHI was still 8.1 which is a reduction but not optimization of CPAP therapy. The patient was placed on 10 cm water pressure this 3 cm EPR and him and nasal mask instead of a full face mask. He also has lost some weight. His BMI was actually 36.7 at the time of the study in July. It appears that the patient has very frequent , cyclic ,mostly central apneas. There was very little respiratory effort. He benefited from titration,  The diagnoses was complex apnea. He was prescribed the CPAP and has brought the machine here .  Compliance download.   The download showed immediate and daily usage of 3 hours and 30 minutes but only for 5 of 30 days. The AHI was 22.8. The medium pressure was 13.2 cm water. This is a high AHI and the noncompliance issue I think that this patient may need to switch to a BiPAP therapy. He reports that he likes feels much better than a  full face mask, he also reports that he has and assisted him using CPAP, he can tell a difference in breathing in the degree of refreshing or restorative quality of sleep.  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 The patient's medication has not changed except that he does know longer take Benadryl at bedtime. He has stated that he likes to use his machine that it works well for him in that he got well adjusted to it. A 30 day download from 1-30 1-16 showed 100% compliance for 30 out of 30 days and 100% compliance for over 4 hours of nightly use. His average usage is 7 hours and 44 minutes his CPAP is set at 10 cm water pressure with 3 cm EPR he has some moderate air leaks but his with digital AHI is only 2.9 so seems not to affect the apnea therapy.  06-01-15 , Revisit for CPAP compliance. Has been 100% compliant for CPAP use over 4 hours daily ,  average user time is 5 hours and 59 minutes at night, the machine is set at 10 cm water pressure with 3 cm EPR. The AHI is 2.6. The Epworth score was endorsed at 5 points the fatigue severity at 33 points the geriatric depression score at one  point no no adjustments have to be made to his CPAP.     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 Leta Baptist . He was seen in my sleep clinic last in 2008.   He was diagnosed with an AHI of 16 in 12-1 2012 .He has meanwhile developed myasthenia and treated by Dr. Leta Baptist. He hadn't used CPAP in several years , felt "well enough ", but just a couple of days ago restarted using it. He brought the machine with him today.  He has a severe residual AHi of 15 and he has noted severe air leaks. I would like for him to undergo a retest to see if apnea is still present, and if so, if treatment is indicated.  His myasthenia can certainly  contribute to respiratory difficulties.  He takes antihistamines to help him sleep, and finds himself more drowsy in AM,  CPAP helped this , and he had less rhinitis while using it.  His usual bedtime is around 11 PM, and he falls asleep quickly.  He wakes once or twice for bathroom break, but usually sleeps through otherwise.His wife has reported that he is snoring again, she has also witnessed apneas which have concerned her.  The seems to be a strong positional component to snoring and apnea in this patient. He rises in the morning around 76:30 AM, he wakes up spontaneously and does not need another arm. He feels usually refreshed , has 2 coffees and has no urge to nap. If he is unstimulated , he drifts off, especially when the TV is boring. He usually sits in a lounge chair while napping. He has at least 7 hours of sleep.  [name] endorsed  the geriatric depression score at one point, the fatigue severity score at 37 points and the for a score at 8 points.   [place]  [place]. Pesticides were used, he was exposed to arsenic and tobacco gum.  His daughters work as a Chief Executive Officer in [place], [place] and one Lexicographer in [place] and one is a Warden/ranger in [place] . His wife is a Industrial/product designer in [place]. His mother died at age 62, her sister at 71 and her mother at 92 years of age.  Previous notes :  UPDATE 09/10/12: Since last visit, doing much better on prednisone. Now on prednisone 60mg  daily. Taking calcium + Vit D, omeprazole. No side effects, except for slightly "buzzy" mood. Sleep apnea better. Strength improved. Almost back to normal. Some left ptosis persists. Some chewing difficulty towards end of meals.  UPDATE 08/13/12: Since last visit, EMG and lab testing confirm myasthenia gravis diagnosis. I prescribed Mestinon for patient. He has had a positive response. He tried 30 mg 3 times a day, but now takes 15 mg 6 times a day, do to see increasing secretions and  diarrhea. Medications lasting approximately 4 hours. No shortness of breath. Continues to have left ptosis, some neck weakness and left arm weakness. Some mild slurred speech. No trouble swallowing. Patient does have diagnosis of sleep apnea, but he has not used his CPAP machine last 2 or 3 years.   PHYSICAL EXAM  Filed Vitals:   06/01/15 1526  BP: 152/72  Pulse: 66  Resp: 20  Height: 5\' 9"  (1.753 m)  Weight: 203 lb (92.08 kg)   Body mass index is 29.96 kg/(m^2).  GENERAL EXAM: Patient is in no distress  CARDIOVASCULAR: irregular rate and rhythm, -  atrial fibrilation ?  Dr. Joylene Draft aware. \no murmurs, no carotid bruits  NEUROLOGIC:  Neck circumference 16.5  inches,  mallompati 4 ,  Mild retrognathia,  no click at TMJ. No dysphagia. Myasthenia manifestation ? Ptosis.   MENTAL STATUS: awake, alert, language fluent, comprehension intact, naming intact CRANIAL NERVE: pupils equal and reactive to light, visual fields full to confrontation, extraocular muscles intact, no nystagmus, facial sensation and strength symmetric, uvula midline, shoulder shrug symmetric, tongue midline. BILATERAL PTOSIS.  MOTOR: 5/5 IN  Upper and lower extremities.  SENSORY: normal and symmetric to light touch. COORDINATION: finger-nose-finger, fine finger movements normal. REFLEXES: deep tendon reflexes present and symmetric; attenuated  achilles reflex.  GAIT/STATION: narrow based gait;   1. Decremental response on repetitive nerve stimulation study of greater than 30%, consistent with neuromuscular junction disorder such as myasthenia gravis. 2. No evidence of underlying myopathy or motor neuropathy. 3. Decreased motor unit recruitment of the left flexor carpi radialis muscle may indicate underlying cervical radiculopathy (C6, C7), but not confirmed on paraspinal muscle testing or nerve conduction studies.  Sleep study results quoted above . Downloads available.  Has an old and heavy machine , he is not  due for a replacement.  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 He used a FFM  CD.   ASSESSMENT AND PLAN    He will follow up in 12 mnth for CPAP compliance.   [place], [facility] [address] [place], [place] [address] [phone]

## 2015-07-28 ENCOUNTER — Other Ambulatory Visit: Payer: Self-pay | Admitting: *Deleted

## 2015-07-28 ENCOUNTER — Telehealth: Payer: Self-pay | Admitting: Diagnostic Neuroimaging

## 2015-07-28 DIAGNOSIS — Z7960 Long term (current) use of unspecified immunomodulators and immunosuppressants: Secondary | ICD-10-CM

## 2015-07-28 DIAGNOSIS — G7000 Myasthenia gravis without (acute) exacerbation: Secondary | ICD-10-CM

## 2015-07-28 DIAGNOSIS — Z79899 Other long term (current) drug therapy: Secondary | ICD-10-CM

## 2015-07-28 NOTE — Telephone Encounter (Signed)
Spoke with patient and informed him this office closes at noon today. Advised that he come one day next week for labs; results will be back well before his FU on 08/09/15. Gave him office lab hours of operation. He verbalized understanding, agreement

## 2015-07-28 NOTE — Telephone Encounter (Signed)
Patient is calling. He is scheduled with Dr. Leta Baptist on 08-09-15 and would like to come in today to have lab done before his appointment. Please call and advise the patient when the order has been put in. Thank you.

## 2015-08-01 ENCOUNTER — Other Ambulatory Visit (INDEPENDENT_AMBULATORY_CARE_PROVIDER_SITE_OTHER): Payer: Self-pay

## 2015-08-01 DIAGNOSIS — Z79899 Other long term (current) drug therapy: Secondary | ICD-10-CM | POA: Diagnosis not present

## 2015-08-01 DIAGNOSIS — Z0289 Encounter for other administrative examinations: Secondary | ICD-10-CM

## 2015-08-01 DIAGNOSIS — G7000 Myasthenia gravis without (acute) exacerbation: Secondary | ICD-10-CM | POA: Diagnosis not present

## 2015-08-01 DIAGNOSIS — Z7960 Long term (current) use of unspecified immunomodulators and immunosuppressants: Secondary | ICD-10-CM

## 2015-08-02 LAB — CBC WITH DIFFERENTIAL/PLATELET
BASOS: 0 %
Basophils Absolute: 0 10*3/uL (ref 0.0–0.2)
EOS (ABSOLUTE): 0.1 10*3/uL (ref 0.0–0.4)
EOS: 2 %
HEMATOCRIT: 39.4 % (ref 37.5–51.0)
Hemoglobin: 13.5 g/dL (ref 12.6–17.7)
IMMATURE GRANS (ABS): 0 10*3/uL (ref 0.0–0.1)
IMMATURE GRANULOCYTES: 0 %
LYMPHS: 27 %
Lymphocytes Absolute: 1.4 10*3/uL (ref 0.7–3.1)
MCH: 34.2 pg — ABNORMAL HIGH (ref 26.6–33.0)
MCHC: 34.3 g/dL (ref 31.5–35.7)
MCV: 100 fL — AB (ref 79–97)
MONOS ABS: 0.4 10*3/uL (ref 0.1–0.9)
Monocytes: 8 %
NEUTROS PCT: 63 %
Neutrophils Absolute: 3.3 10*3/uL (ref 1.4–7.0)
Platelets: 187 10*3/uL (ref 150–379)
RBC: 3.95 x10E6/uL — ABNORMAL LOW (ref 4.14–5.80)
RDW: 15 % (ref 12.3–15.4)
WBC: 5.2 10*3/uL (ref 3.4–10.8)

## 2015-08-02 LAB — COMPREHENSIVE METABOLIC PANEL
ALK PHOS: 50 IU/L (ref 39–117)
ALT: 13 IU/L (ref 0–44)
AST: 24 IU/L (ref 0–40)
Albumin/Globulin Ratio: 1.8 (ref 1.2–2.2)
Albumin: 4.3 g/dL (ref 3.5–4.8)
BUN/Creatinine Ratio: 20 (ref 10–24)
BUN: 28 mg/dL — AB (ref 8–27)
Bilirubin Total: 0.7 mg/dL (ref 0.0–1.2)
CALCIUM: 9.6 mg/dL (ref 8.6–10.2)
CO2: 23 mmol/L (ref 18–29)
CREATININE: 1.39 mg/dL — AB (ref 0.76–1.27)
Chloride: 101 mmol/L (ref 96–106)
GFR calc Af Amer: 57 mL/min/{1.73_m2} — ABNORMAL LOW (ref >59)
GFR calc non Af Amer: 49 mL/min/{1.73_m2} — ABNORMAL LOW (ref >59)
GLUCOSE: 93 mg/dL (ref 65–99)
Globulin, Total: 2.4 g/dL (ref 1.5–4.5)
Potassium: 4.6 mmol/L (ref 3.5–5.2)
Sodium: 141 mmol/L (ref 134–144)
Total Protein: 6.7 g/dL (ref 6.0–8.5)

## 2015-08-07 DIAGNOSIS — D2372 Other benign neoplasm of skin of left lower limb, including hip: Secondary | ICD-10-CM | POA: Diagnosis not present

## 2015-08-07 DIAGNOSIS — L821 Other seborrheic keratosis: Secondary | ICD-10-CM | POA: Diagnosis not present

## 2015-08-07 DIAGNOSIS — D692 Other nonthrombocytopenic purpura: Secondary | ICD-10-CM | POA: Diagnosis not present

## 2015-08-07 DIAGNOSIS — Z85828 Personal history of other malignant neoplasm of skin: Secondary | ICD-10-CM | POA: Diagnosis not present

## 2015-08-07 DIAGNOSIS — L661 Lichen planopilaris: Secondary | ICD-10-CM | POA: Diagnosis not present

## 2015-08-07 DIAGNOSIS — L570 Actinic keratosis: Secondary | ICD-10-CM | POA: Diagnosis not present

## 2015-08-09 ENCOUNTER — Encounter: Payer: Self-pay | Admitting: Diagnostic Neuroimaging

## 2015-08-09 ENCOUNTER — Ambulatory Visit (INDEPENDENT_AMBULATORY_CARE_PROVIDER_SITE_OTHER): Payer: Medicare Other | Admitting: Diagnostic Neuroimaging

## 2015-08-09 VITALS — BP 149/77 | HR 54 | Ht 69.0 in | Wt 202.6 lb

## 2015-08-09 DIAGNOSIS — Z9989 Dependence on other enabling machines and devices: Secondary | ICD-10-CM

## 2015-08-09 DIAGNOSIS — G4733 Obstructive sleep apnea (adult) (pediatric): Secondary | ICD-10-CM | POA: Diagnosis not present

## 2015-08-09 DIAGNOSIS — G7000 Myasthenia gravis without (acute) exacerbation: Secondary | ICD-10-CM

## 2015-08-09 MED ORDER — AZATHIOPRINE 50 MG PO TABS: 150.0000 mg | ORAL_TABLET | Freq: Every day | ORAL | Status: DC

## 2015-08-09 MED ORDER — PYRIDOSTIGMINE BROMIDE 60 MG PO TABS: 30.0000 mg | ORAL_TABLET | Freq: Three times a day (TID) | ORAL | Status: DC

## 2015-08-09 NOTE — Patient Instructions (Signed)

## 2015-08-09 NOTE — Progress Notes (Signed)
[facility]  PATIENT: Oscar Jacobs DOB: 04/10/1941  REFERRING CLINICIAN:  HISTORY FROM: patient and wife REASON FOR VISIT: follow up   HISTORICAL  CHIEF COMPLAINT:  Chief Complaint  Patient presents with  . Myasthenia gravis    rm 6, Wife- [name], "doing well, the medicine is at the right dose, no side effects of significance"  . Follow-up    6 month    HISTORY OF PRESENT ILLNESS:   UPDATE 08/09/15: Since last visit, doing well. Cardiology consult and workup completed and pt reassured. MG is stable. Tolerating meds.   UPDATE 02/08/15: Since last visit, doing well. No new events. Tolerating imuran and mestinon. Today has some low heart rate (not normal) and irregular rhythm. No chest pain or shortness of breath. Family hx of cardiac arrhythmia in brother and brother's son.   UPDATE 08/09/14: Since last visit, doing well. No new sxs. Stable on imuran and mestinon. No weakness or SOB.   UPDATE 02/07/14: Since last visit, MG is stable. No SOB, weakness, slurred speech or trouble swallowing. Has skin CA removed on leg (recurrent from 3 years ago). Has annual dermatology eval. Tolerating imuran 150mg  daily + mestinon 30mg  TID. Doing better with OSA and CPAP.  UPDATE 04/09/13: Continues to do well on imuran 150mg  daily. No side effects. Has been to PCP (Dr. Joylene Draft) for annual physical; PSA slightly elevated and has follow up. Patient has gained some weight (he thinks due to inactivity). Otherwise no concerns. No SOB, weakness, numbness.  UPDATE 12/29/12: Doing well on imuran 150mg  daily. No new sxs. No SOB. No weakness. Working in the yard.  UPDATE 11/06/12: Doing well on imuran 100mg  daily. No MG flares. Left ptosis is better. No mania or mood disturbances. Last labs were improved. Taking mestinon 30mg  TID. Sometimes has diarrhea, and then takes 15mg  dose.  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 He was kindly and expertly managed by medical and neurohospitalists, and tapered off prednisone, treated with IVIG, and then started on imuran. Doing well now. Mania is almost fully resolved. Still a bit "hyper" per wife, but much improved. Platelets trended down near end of hospital stay (last 120). Here for follow up labs as well.  UPDATE 09/10/12: Since last visit, doing much better on prednisone. Now on prednisone 60mg  daily. Taking calcium + Vit D, omeprazole. No side effects, except for slightly "buzzy" mood. Sleep apnea better. Strength improved. Almost back to normal. Some left ptosis persists. Some chewing difficulty towards end of meals.  UPDATE 08/13/12: Since last visit, EMG and lab testing confirm myasthenia gravis diagnosis. I prescribed Mestinon for patient. He has had a positive response. He tried 30 mg 3 times a day, but now takes 15 mg 6 times a day, do to see increasing secretions and diarrhea. Medications lasting approximately 4 hours. No shortness of breath. Continues to have left ptosis, some neck weakness and left arm weakness. Some mild slurred speech. No trouble swallowing.  PRIOR HPI (08/06/12): 75 year old right-handed male with history of hypertension, seasonal allergies, here for evaluation of possible myasthenia gravis. 6 weeks ago patient noted left eyelid drooping. No double vision. Over the next few weeks he had increasing weakness in keeping his head upright as well as difficulty with talking and chewing. Since this past Saturday symptoms have been significantly worse. He denies any shortness of breath or choking problems. He has mild weakness in his arms and no weakness in his  legs. No headache, confusion, chest pain. No similar symptoms in the past. Patient does have diagnosis of sleep apnea, but he has not used his CPAP machine last 2 or 3 years.  REVIEW OF SYSTEMS: Full 14 system review of systems performed and notable for runny nose.     ALLERGIES: Allergies  Allergen Reactions  . Prednisone Other (See Comments)    manic  . Penicillins Rash  . Risperidone And Related Other (See Comments)    hyperactivity    HOME MEDICATIONS:  Outpatient Encounter Prescriptions as of 08/09/2015  Medication Sig  . aspirin EC 81 MG tablet Take 81 mg by mouth every morning.  Marland Kitchen azaTHIOprine (IMURAN) 50 MG tablet Take 3 tablets (150 mg total) by mouth daily.  . calcium-vitamin D (OSCAL WITH D) 500-200 MG-UNIT per tablet Take 1 tablet by mouth 2 (two) times daily.  . cholecalciferol (VITAMIN D) 1000 UNITS tablet Take 500 Units by mouth daily.   . Cyanocobalamin (VITAMIN B-12 CR PO) Take 1,250 mg by mouth daily.  . Multiple Vitamin (MULTIVITAMIN WITH MINERALS) TABS Take 1 tablet by mouth daily.  Marland Kitchen pyridostigmine (MESTINON) 60 MG tablet Take 0.5 tablets (30 mg total) by mouth 3 (three) times daily.  . ramipril (ALTACE) 10 MG capsule Take 10 mg by mouth daily.   No facility-administered encounter medications on file as of 08/09/2015.     PAST MEDICAL HISTORY: Past Medical History  Diagnosis Date  . Sleep apnea   . Hypertension   . Squamous carcinoma ([place])   . Myasthenia gravis ([place])   . GERD (gastroesophageal reflux disease)   . CPAP (continuous positive airway pressure) dependence     PAST SURGICAL HISTORY: Past Surgical History  Procedure Laterality Date  . Skin cancer removal      FAMILY HISTORY: Family History  Problem Relation Age of Onset  . Heart disease Mother   . Cancer - Other Father   . Heart disease Sister   . Heart disease Brother   . Bipolar disorder Sister   . Bipolar disorder Sister     suicide  . Healthy Brother     SOCIAL HISTORY:  Social History   Social History  . Marital Status: Married    Spouse Name: Conway Moynihan  . Number of Children: 3  . Years of Education: MD   Occupational History  . Retired     Radiology MD   Social History Main Topics  . Smoking status: Never  Smoker   . Smokeless tobacco: Never Used  . Alcohol Use: Yes     Comment: seldom  . Drug Use: No  . Sexual Activity: Not on file   Other Topics Concern  . Not on file   Social History Narrative   Patient is married ([name]) and lives at home with his wife.   Patient has three adult children.   Patient is retired, Stage manager.   Married.  Ambulates unassisted.   Caffeine Use- 4 cups daily.   Patient is right-handed.   Patient has a college education.     PHYSICAL EXAM  Filed Vitals:   08/09/15 1019  BP: 149/77  Pulse: 54  Height: 5\' 9"  (1.753 m)  Weight: 202 lb 9.6 oz (91.899 kg)   Body mass index is 29.91 kg/(m^2).  GENERAL EXAM: Patient is in no distress  CARDIOVASCULAR: BRADYCARDIA; no murmurs, no carotid bruits  NEUROLOGIC: MENTAL STATUS: awake, alert, language fluent, comprehension intact, naming intact CRANIAL NERVE: pupils equal and reactive to light, visual fields  full to confrontation, extraocular muscles intact, no nystagmus, facial sensation and strength symmetric, uvula midline, shoulder shrug symmetric, tongue midline. SUBTLE LEFT PTOSIS.  MOTOR: NORMAL TONE; 5/5 IN BUE AND BLE.  SENSORY: normal and symmetric to light touch and vibration COORDINATION: finger-nose-finger, fine finger movements normal REFLEXES: deep tendon reflexes present and symmetric; TRACE AT ANKLES. GAIT/STATION: narrow based gait   DIAGNOSTIC DATA (LABS, IMAGING, TESTING) - I reviewed patient records, labs, notes, testing and imaging myself where available.  Lab Results  Component Value Date   WBC 5.2 08/01/2015   HGB 13.9 08/03/2014   HCT 39.4 08/01/2015   MCV 100* 08/01/2015   PLT 187 08/01/2015   CMP Latest Ref Rng 08/01/2015 08/03/2014 04/07/2013  Glucose 65 - 99 mg/dL 93 92 106(H)  BUN 8 - 27 mg/dL 28(H) 22 18  Creatinine 0.76 - 1.27 mg/dL 1.39(H) 1.31(H) 1.30(H)  Sodium 134 - 144 mmol/L 141 140 143  Potassium 3.5 - 5.2 mmol/L 4.6 4.8 4.2  Chloride 96 - 106 mmol/L  101 102 104  CO2 18 - 29 mmol/L 23 22 23   Calcium 8.6 - 10.2 mg/dL 9.6 9.1 8.9  Total Protein 6.0 - 8.5 g/dL 6.7 6.9 6.4  Total Bilirubin 0.0 - 1.2 mg/dL 0.7 0.6 0.4  Alkaline Phos 39 - 117 IU/L 50 50 55  AST 0 - 40 IU/L 24 20 23   ALT 0 - 44 IU/L 13 17 21     08/05/12 AchR binding 33.80 (h), AchR blocking 36% (h), AchR modulating 38% (h), anti-striational (1:80 (h)  08/05/12 B12 643, TSH 4.07, FT4 1.1, BUN/Cr 21/1.7  08/06/12 CT chest - no thymoma, small nodules within the thyroid gland are likely benign, left lower lobe pulmonary nodule, Bosniak I cyst of the right kidney is partially imaged, coronary artery calcifications.  08/06/12 EMG/NCS  1. Decremental response on repetitive nerve stimulation study of greater than 30%, consistent with neuromuscular junction disorder such as myasthenia gravis. 2. No evidence of underlying myopathy or motor neuropathy. 3. Decreased motor unit recruitment of the left flexor carpi radialis muscle may indicate underlying cervical radiculopathy (C6, C7), but not confirmed on paraspinal muscle testing or nerve conduction studies.  01/21/14 CBC (WBC 4.5, Plt 207, LFTs normal)   ASSESSMENT AND PLAN  75 y.o. year old male  has a past medical history of Sleep apnea; Hypertension; Squamous carcinoma ([place]); Myasthenia gravis ([place]); GERD (gastroesophageal reflux disease); and CPAP (continuous positive airway pressure) dependence. here with myasthenia gravis based on history and exam, EMG and lab testing. Did well with mestinon and prednisone until he developed steroid psychosis/mania. Now transitioned to imuran and doing well since July 2014.  Problem List:  Myasthenia gravis - stable  Steroid psychosis/mania - RESOLVED  Thrombocytopenia - RESOLVED  Bradycardia and irregular heart rate - RESOLVED  Myasthenia gravis without exacerbation (HCC)  OSA on CPAP    PLAN: - Continue pyridostigmine 30 mg TID - Continue imuran 150mg  daily - Continue CBC, CMP  monitoring (for imuran) q65months - monitor kidney function (slightly worse since last check)  Meds ordered this encounter  Medications  . pyridostigmine (MESTINON) 60 MG tablet    Sig: Take 0.5 tablets (30 mg total) by mouth 3 (three) times daily.    Dispense:  180 tablet    Refill:  4  . azaTHIOprine (IMURAN) 50 MG tablet    Sig: Take 3 tablets (150 mg total) by mouth daily.    Dispense:  270 tablet    Refill:  4   Return in  about 6 months (around 02/08/2016).     Penni Bombard, MD [id number], [id number] AM Certified in Neurology, Neurophysiology and Neuroimaging  [facility] [address], [place], [place] [address] [phone]

## 2015-11-04 ENCOUNTER — Other Ambulatory Visit: Payer: Self-pay | Admitting: Diagnostic Neuroimaging

## 2015-11-16 ENCOUNTER — Other Ambulatory Visit: Payer: Self-pay | Admitting: Diagnostic Neuroimaging

## 2016-02-06 DIAGNOSIS — Z125 Encounter for screening for malignant neoplasm of prostate: Secondary | ICD-10-CM | POA: Diagnosis not present

## 2016-02-06 DIAGNOSIS — N183 Chronic kidney disease, stage 3 (moderate): Secondary | ICD-10-CM | POA: Diagnosis not present

## 2016-02-06 DIAGNOSIS — E784 Other hyperlipidemia: Secondary | ICD-10-CM | POA: Diagnosis not present

## 2016-02-06 DIAGNOSIS — R8299 Other abnormal findings in urine: Secondary | ICD-10-CM | POA: Diagnosis not present

## 2016-02-09 ENCOUNTER — Ambulatory Visit (INDEPENDENT_AMBULATORY_CARE_PROVIDER_SITE_OTHER): Payer: Medicare Other | Admitting: Diagnostic Neuroimaging

## 2016-02-09 ENCOUNTER — Encounter: Payer: Self-pay | Admitting: Diagnostic Neuroimaging

## 2016-02-09 VITALS — BP 126/70 | HR 55 | Ht 69.0 in | Wt 197.0 lb

## 2016-02-09 DIAGNOSIS — G4733 Obstructive sleep apnea (adult) (pediatric): Secondary | ICD-10-CM

## 2016-02-09 DIAGNOSIS — Z79899 Other long term (current) drug therapy: Secondary | ICD-10-CM

## 2016-02-09 DIAGNOSIS — G7000 Myasthenia gravis without (acute) exacerbation: Secondary | ICD-10-CM | POA: Diagnosis not present

## 2016-02-09 DIAGNOSIS — Z7960 Long term (current) use of unspecified immunomodulators and immunosuppressants: Secondary | ICD-10-CM | POA: Insufficient documentation

## 2016-02-09 DIAGNOSIS — Z9989 Dependence on other enabling machines and devices: Secondary | ICD-10-CM | POA: Diagnosis not present

## 2016-02-09 MED ORDER — PYRIDOSTIGMINE BROMIDE 60 MG PO TABS: 30.0000 mg | ORAL_TABLET | Freq: Three times a day (TID) | ORAL | 4 refills | Status: DC

## 2016-02-09 MED ORDER — AZATHIOPRINE 50 MG PO TABS: 150.0000 mg | ORAL_TABLET | Freq: Every day | ORAL | 4 refills | Status: DC

## 2016-02-09 NOTE — Progress Notes (Signed)
[facility]  PATIENT: Oscar Jacobs DOB: 1941/02/11  REFERRING CLINICIAN:  HISTORY FROM: patient and wife REASON FOR VISIT: follow up   HISTORICAL  CHIEF COMPLAINT:  Chief Complaint  Patient presents with  . Follow-up    RM 7, with wife. Last seen 08/09/15 for MG. Doing well. No new symptoms per patient.     HISTORY OF PRESENT ILLNESS:   UPDATE 02/09/16: Since last visit, doing well. No new symptoms. Tolerating medications. Staying active with yard work.   UPDATE 08/09/15: Since last visit, doing well. Cardiology consult and workup completed and pt reassured. MG is stable. Tolerating meds.   UPDATE 02/08/15: Since last visit, doing well. No new events. Tolerating imuran and mestinon. Today has some low heart rate (not normal) and irregular rhythm. No chest pain or shortness of breath. Family hx of cardiac arrhythmia in brother and brother's son.   UPDATE 08/09/14: Since last visit, doing well. No new sxs. Stable on imuran and mestinon. No weakness or SOB.   UPDATE 02/07/14: Since last visit, MG is stable. No SOB, weakness, slurred speech or trouble swallowing. Has skin CA removed on leg (recurrent from 3 years ago). Has annual dermatology eval. Tolerating imuran 150mg  daily + mestinon 30mg  TID. Doing better with OSA and CPAP.  UPDATE 04/09/13: Continues to do well on imuran 150mg  daily. No side effects. Has been to PCP (Dr. Joylene Draft) for annual physical; PSA slightly elevated and has follow up. Patient has gained some weight (he thinks due to inactivity). Otherwise no concerns. No SOB, weakness, numbness.  UPDATE 12/29/12: Doing well on imuran 150mg  daily. No new sxs. No SOB. No weakness. Working in the yard.  UPDATE 11/06/12: Doing well on imuran 100mg  daily. No MG flares. Left ptosis is better. No mania or mood disturbances. Last labs were improved. Taking mestinon 30mg  TID. Sometimes has diarrhea, and then takes 15mg  dose.  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 He was kindly and expertly managed by medical and neurohospitalists, and tapered off prednisone, treated with IVIG, and then started on imuran. Doing well now. Mania is almost fully resolved. Still a bit "hyper" per wife, but much improved. Platelets trended down near end of hospital stay (last 120). Here for follow up labs as well.  UPDATE 09/10/12: Since last visit, doing much better on prednisone. Now on prednisone 60mg  daily. Taking calcium + Vit D, omeprazole. No side effects, except for slightly "buzzy" mood. Sleep apnea better. Strength improved. Almost back to normal. Some left ptosis persists. Some chewing difficulty towards end of meals.  UPDATE 08/13/12: Since last visit, EMG and lab testing confirm myasthenia gravis diagnosis. I prescribed Mestinon for patient. He has had a positive response. He tried 30 mg 3 times a day, but now takes 15 mg 6 times a day, do to see increasing secretions and diarrhea. Medications lasting approximately 4 hours. No shortness of breath. Continues to have left ptosis, some neck weakness and left arm weakness. Some mild slurred speech. No trouble swallowing.  PRIOR HPI (08/06/12): 75 year old right-handed male with history of hypertension, seasonal allergies, here for evaluation of possible myasthenia gravis. 6 weeks ago patient noted left eyelid drooping. No double vision. Over the next few weeks he had increasing weakness in keeping his head upright as well as difficulty with talking and chewing. Since this past Saturday symptoms have been significantly worse. He denies any shortness of breath or choking problems. He has mild  weakness in his arms and no weakness in his legs. No headache, confusion, chest pain. No similar symptoms in the past. Patient does have diagnosis of sleep apnea, but he has not used his CPAP machine last 2 or 3 years.   REVIEW OF SYSTEMS: Full 14 system review of  systems performed and negative except env allergies.    ALLERGIES: Allergies  Allergen Reactions  . Prednisone Other (See Comments)    manic  . Penicillins Rash  . Risperidone And Related Other (See Comments)    hyperactivity    HOME MEDICATIONS:  Outpatient Encounter Prescriptions as of 02/09/2016  Medication Sig  . aspirin EC 81 MG tablet Take 81 mg by mouth every morning.  Marland Kitchen azaTHIOprine (IMURAN) 50 MG tablet Take 3 tablets (150 mg total) by mouth daily.  . calcium-vitamin D (OSCAL WITH D) 500-200 MG-UNIT per tablet Take 1 tablet by mouth 2 (two) times daily.  . cholecalciferol (VITAMIN D) 1000 UNITS tablet Take 500 Units by mouth daily.   . Cyanocobalamin (VITAMIN B-12 CR PO) Take 1,250 mg by mouth daily.  . Multiple Vitamin (MULTIVITAMIN WITH MINERALS) TABS Take 1 tablet by mouth daily.  Marland Kitchen pyridostigmine (MESTINON) 60 MG tablet Take 0.5 tablets (30 mg total) by mouth 3 (three) times daily.  . ramipril (ALTACE) 10 MG capsule Take 10 mg by mouth daily.   No facility-administered encounter medications on file as of 02/09/2016.      PAST MEDICAL HISTORY: Past Medical History:  Diagnosis Date  . CPAP (continuous positive airway pressure) dependence   . GERD (gastroesophageal reflux disease)   . Hypertension   . Myasthenia gravis ([place])   . Sleep apnea   . Squamous carcinoma     PAST SURGICAL HISTORY: Past Surgical History:  Procedure Laterality Date  . Skin cancer removal      FAMILY HISTORY: Family History  Problem Relation Age of Onset  . Heart disease Mother   . Cancer - Other Father   . Heart disease Sister   . Heart disease Brother   . Bipolar disorder Sister   . Bipolar disorder Sister     suicide  . Healthy Brother     SOCIAL HISTORY:  Social History   Social History  . Marital status: Married    Spouse name: Ivor Bloyd  . Number of children: 3  . Years of education: MD   Occupational History  . Retired     Radiology MD    Social History Main Topics  . Smoking status: Never Smoker  . Smokeless tobacco: Never Used  . Alcohol use Yes     Comment: seldom  . Drug use: No  . Sexual activity: Not on file   Other Topics Concern  . Not on file   Social History Narrative   Patient is married ([name]) and lives at home with his wife.   Patient has three adult children.   Patient is retired, Stage manager.   Married.  Ambulates unassisted.   Caffeine Use- 4 cups daily.   Patient is right-handed.   Patient has a college education.     PHYSICAL EXAM  Vitals:   02/09/16 0831  BP: 126/70  BP Location: Left Arm  Patient Position: Sitting  Cuff Size: Normal  Pulse: (!) 55  Weight: 197 lb (89.4 kg)  Height: 5\' 9"  (1.753 m)   Wt Readings from Last 3 Encounters:  02/09/16 197 lb (89.4 kg)  08/09/15 202 lb 9.6 oz (91.9 kg)  06/01/15 203 lb (  92.1 kg)     Body mass index is 29.09 kg/m.  GENERAL EXAM: Patient is in no distress  CARDIOVASCULAR: BRADYCARDIA; no murmurs, no carotid bruits  NEUROLOGIC: MENTAL STATUS: awake, alert, language fluent, comprehension intact, naming intact CRANIAL NERVE: pupils equal and reactive to light, visual fields full to confrontation, extraocular muscles intact, no nystagmus, facial sensation and strength symmetric, uvula midline, shoulder shrug symmetric, tongue midline. LEFT PTOSIS. NO DOUBLE VISION MOTOR: NORMAL TONE; 5/5 IN BUE AND BLE.  SENSORY: normal and symmetric to light touch and vibration COORDINATION: finger-nose-finger, fine finger movements normal REFLEXES: deep tendon reflexes present and symmetric; TRACE AT ANKLES. GAIT/STATION: narrow based gait   DIAGNOSTIC DATA (LABS, IMAGING, TESTING) - I reviewed patient records, labs, notes, testing and imaging myself where available.  Lab Results  Component Value Date   WBC 5.2 08/01/2015   HGB 13.9 08/03/2014   HCT 39.4 08/01/2015   MCV 100 (H) 08/01/2015   PLT 187 08/01/2015   CMP Latest Ref Rng  & Units 08/01/2015 08/03/2014 04/07/2013  Glucose 65 - 99 mg/dL 93 92 106(H)  BUN 8 - 27 mg/dL 28(H) 22 18  Creatinine 0.76 - 1.27 mg/dL 1.39(H) 1.31(H) 1.30(H)  Sodium 134 - 144 mmol/L 141 140 143  Potassium 3.5 - 5.2 mmol/L 4.6 4.8 4.2  Chloride 96 - 106 mmol/L 101 102 104  CO2 18 - 29 mmol/L 23 22 23   Calcium 8.6 - 10.2 mg/dL 9.6 9.1 8.9  Total Protein 6.0 - 8.5 g/dL 6.7 6.9 6.4  Total Bilirubin 0.0 - 1.2 mg/dL 0.7 0.6 0.4  Alkaline Phos 39 - 117 IU/L 50 50 55  AST 0 - 40 IU/L 24 20 23   ALT 0 - 44 IU/L 13 17 21     08/05/12 AchR binding 33.80 (h), AchR blocking 36% (h), AchR modulating 38% (h), anti-striational (1:80 (h)  08/05/12 B12 643, TSH 4.07, FT4 1.1, BUN/Cr 21/1.7  08/06/12 CT chest - no thymoma, small nodules within the thyroid gland are likely benign, left lower lobe pulmonary nodule, Bosniak I cyst of the right kidney is partially imaged, coronary artery calcifications.  08/06/12 EMG/NCS  1. Decremental response on repetitive nerve stimulation study of greater than 30%, consistent with neuromuscular junction disorder such as myasthenia gravis. 2. No evidence of underlying myopathy or motor neuropathy. 3. Decreased motor unit recruitment of the left flexor carpi radialis muscle may indicate underlying cervical radiculopathy (C6, C7), but not confirmed on paraspinal muscle testing or nerve conduction studies.  01/21/14 CBC (WBC 4.5, Plt 207, LFTs normal)   ASSESSMENT AND PLAN  75 y.o. year old male  has a past medical history of CPAP (continuous positive airway pressure) dependence; GERD (gastroesophageal reflux disease); Hypertension; Myasthenia gravis ([place]); Sleep apnea; and Squamous carcinoma. here with myasthenia gravis based on history and exam, EMG and lab testing. Did well with mestinon and prednisone until he developed steroid psychosis/mania. Now transitioned to imuran and doing well since July 2014.  Problem List:  Myasthenia gravis - stable  Steroid psychosis/mania -  RESOLVED  Thrombocytopenia - RESOLVED  Bradycardia and irregular heart rate - RESOLVED  Myasthenia gravis without exacerbation (HCC)  OSA on CPAP  Long term current use of immunosuppressive drug    PLAN: - Continue pyridostigmine 30 mg TID - Continue imuran 150mg  daily - Continue CBC, CMP monitoring (for imuran) q36months  Meds ordered this encounter  Medications  . azaTHIOprine (IMURAN) 50 MG tablet    Sig: Take 3 tablets (150 mg total) by mouth daily.  Dispense:  270 tablet    Refill:  4  . pyridostigmine (MESTINON) 60 MG tablet    Sig: Take 0.5 tablets (30 mg total) by mouth 3 (three) times daily.    Dispense:  180 tablet    Refill:  4   Return in about 8 months (around 10/09/2016).     Penni Bombard, MD [id number], [id number] AM Certified in Neurology, Neurophysiology and Neuroimaging  [facility] [address] [place], [place] [address] [phone]

## 2016-02-21 DIAGNOSIS — N400 Enlarged prostate without lower urinary tract symptoms: Secondary | ICD-10-CM | POA: Diagnosis not present

## 2016-02-22 DIAGNOSIS — Z23 Encounter for immunization: Secondary | ICD-10-CM | POA: Diagnosis not present

## 2016-02-22 DIAGNOSIS — R001 Bradycardia, unspecified: Secondary | ICD-10-CM | POA: Diagnosis not present

## 2016-02-22 DIAGNOSIS — R972 Elevated prostate specific antigen [PSA]: Secondary | ICD-10-CM | POA: Diagnosis not present

## 2016-02-22 DIAGNOSIS — R3129 Other microscopic hematuria: Secondary | ICD-10-CM | POA: Diagnosis not present

## 2016-02-22 DIAGNOSIS — I498 Other specified cardiac arrhythmias: Secondary | ICD-10-CM | POA: Diagnosis not present

## 2016-02-22 DIAGNOSIS — Z0000 Encounter for general adult medical examination without abnormal findings: Secondary | ICD-10-CM | POA: Diagnosis not present

## 2016-02-22 DIAGNOSIS — Z1389 Encounter for screening for other disorder: Secondary | ICD-10-CM | POA: Diagnosis not present

## 2016-02-22 DIAGNOSIS — N183 Chronic kidney disease, stage 3 (moderate): Secondary | ICD-10-CM | POA: Diagnosis not present

## 2016-02-22 DIAGNOSIS — E784 Other hyperlipidemia: Secondary | ICD-10-CM | POA: Diagnosis not present

## 2016-02-22 DIAGNOSIS — Z6827 Body mass index (BMI) 27.0-27.9, adult: Secondary | ICD-10-CM | POA: Diagnosis not present

## 2016-02-22 DIAGNOSIS — G7000 Myasthenia gravis without (acute) exacerbation: Secondary | ICD-10-CM | POA: Diagnosis not present

## 2016-02-22 DIAGNOSIS — D7589 Other specified diseases of blood and blood-forming organs: Secondary | ICD-10-CM | POA: Diagnosis not present

## 2016-02-28 DIAGNOSIS — R3121 Asymptomatic microscopic hematuria: Secondary | ICD-10-CM | POA: Diagnosis not present

## 2016-02-28 DIAGNOSIS — R972 Elevated prostate specific antigen [PSA]: Secondary | ICD-10-CM | POA: Diagnosis not present

## 2016-06-01 ENCOUNTER — Encounter: Payer: Self-pay | Admitting: Neurology

## 2016-06-03 ENCOUNTER — Ambulatory Visit (INDEPENDENT_AMBULATORY_CARE_PROVIDER_SITE_OTHER): Payer: Medicare Other | Admitting: Neurology

## 2016-06-03 ENCOUNTER — Encounter: Payer: Self-pay | Admitting: Neurology

## 2016-06-03 VITALS — BP 122/54 | HR 72 | Resp 16 | Ht 69.0 in | Wt 193.0 lb

## 2016-06-03 DIAGNOSIS — G4733 Obstructive sleep apnea (adult) (pediatric): Secondary | ICD-10-CM

## 2016-06-03 NOTE — Progress Notes (Signed)
[facility]  PATIENT: Oscar Jacobs DOB: [birth year]  REFERRING CLINICIAN:  HISTORY FROM: patient and wife REASON FOR VISIT: follow up on CPAP compliance   HISTORICAL  CHIEF COMPLAINT:  Chief Complaint  Patient presents with  . Follow-up    Rm 10. Patient states that he would like to get a newer CPAP machine, or have an old one re-programed to use in travel. 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 This study was performed on 11-24-13. The patient at the time had an AHI of 64.8, an RDI of 66.0 disconsolate severe sleep apnea. He didn't have any periodic limb movements and device majority of his arousals with respiratory cost. Cardiac heart rate was very irregular between 54 and 77 beats per minute with variable R. to R interval's. It appears that this patient had sinus tachycardia and some wide complex tachycardia during sleep.  Atrial fibrillation was found, a condition that he shares with his son, paternal uncle.  The patient was titrated beginning at 5 cm water pressure to 10 cm water pressure even when reaching tandem his AHI was still 8.1 which is a reduction but not optimization of CPAP therapy. The patient was placed on 10 cm water pressure this 3 cm EPR and him and nasal mask instead of a full face mask. He also has lost some weight. His BMI was actually 36.7 at the time of the study in July. It appears that the patient has very frequent , cyclic ,mostly central apneas. There was very little respiratory effort. He benefited from titration,  The diagnoses was complex apnea. He was prescribed the CPAP and has brought the machine here .  Compliance download.    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 . He was seen in my sleep clinic last in 2008.   He was diagnosed with an AHI of 16 in 12-1 2012 .He has meanwhile developed myasthenia  and treated by [name]. He hadn't used CPAP in several years , felt "well enough ", but just a couple of days ago restarted using it. He brought the machine with him today. He has a severe residual AHi of 15 and he has noted severe air leaks.I  would like for him to undergo a retest to see if apnea is still present, and if so, if treatment is indicated.  His myasthenia can certainly contribute to respiratory difficulties.  He takes antihistamines to help him sleep, and finds himself more drowsy in AM,  CPAP helped this , and he had less rhinitis while using it.  His usual bedtime is around 11 PM, and he falls asleep quickly.  He wakes once or twice for bathroom break, but usually sleeps through otherwise.His wife has reported that he is snoring again, she has also witnessed apneas which have concerned her.  The seems to be a strong positional component to snoring and apnea in this patient. He rises in the morning around 6:30 AM, he wakes up spontaneously and does not need another arm. He feels usually refreshed , has 2 coffees and has no urge to nap. If he is unstimulated , he drifts off, especially when the TV is boring. He usually sits in a lounge chair while napping. He has at least 7 hours of sleep. [name] endorsed  the geriatric depression score at one point,  the fatigue severity score at 37 points and the for a score at 8 points.   [place]  [place]. Pesticides were used, he was exposed to arsenic and tobacco gum.  His daughters work, one  as a Chief Executive Officer in [place], [place] and one pediatrician in [place] ( adolescent medicine )  and one is a [employer] education careers adviser formerly in [place], now in [place].   His wife is a Industrial/product designer in [place]. His mother died at age 27, her sister at 92 and her mother at 51 years of age.  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 He  has been 100% compliance for the last 30 days, with an average user time of 5 hours and 59 minutes his CPAP machine is set at 10 cm water pressure, his previous CPAP at been set at 13. He is doing excellently with a 3 cm water EPR, also known as expiratory pressure relief and a residual AHI at 2.5. He does have some air leaks but they do not affect him on most days. In short I would not have to reset the machine to any other pressures his fatigue severity scale is in normal range at 24 points, his Epworth Sleepiness Scale result is excellent at 5 points. He has frequent sinusitis, mostly affecting the right. Sinusitis will sometimes drain and "strangling " him. About a month ago, he has a period of 3 days with very dry airway. Used a Designer, television/film set in the bedroom and felt better.   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 I do have model of the travel CPAP but I can show him in the sleep lab. I know that the purchase price is close $1000. However this machine is much smaller very quiet and will download dated to the smart phone so that date I can later be merged into one record for compliance by [facility].   PHYSICAL EXAM  Vitals:   06/03/16 1406  BP: (!) 122/54  Pulse: 72  Resp: 16  Weight: 193 lb (87.5 kg)  Height: 5\' 9"  (1.753 m)   Body mass index is 28.5 kg/m.  GENERAL EXAM: Patient is in no distress  CARDIOVASCULAR: irregular rate and rhythm, every 5th beat seems split.    NEUROLOGIC: No change in smell or taste, equal pupillary size, nasal congestion.  Neck circumference 16.5  inches,  mallompati 4 ,  Mild retrognathia,  no click at TMJ. No dysphagia. Myasthenia manifestation ? Ptosis.   [name] owns a older CPAP machine that was set at 13 cm water pressure he would be interested in having this attached to the current pressures so he has a second machine available as the second home. He used a [facility]  CD.   ASSESSMENT AND PLAN - OSA  well controlled on CPAP, irregular heart beats, rhinitis and sinusitis.  He will follow up in 12 month for CPAP compliance.   [place], [facility] [address], [place], [place] [address] [phone]

## 2016-06-07 IMAGING — NM NM MYOCAR MULTI W/ SPECT
3 series · 18 of 18 positions shown · non-contrast
Comparison: none

[Series 1: stress_(id)_sa · 6.5mm · 6.51mm/px · 6 of 64 frames shown (1 of 2)]
[frame 6/64]
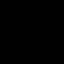
[frame 16/64]
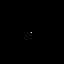
[frame 27/64]
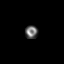
[frame 38/64]
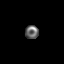
[frame 48/64]
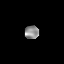
[frame 59/64]
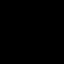

[Series 1: rest_(id)_sa · 6.5mm · 6.51mm/px · 6 of 64 frames shown]
[frame 6/64]
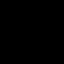
[frame 16/64]
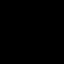
[frame 27/64]
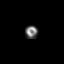
[frame 38/64]
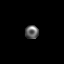
[frame 48/64]
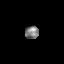
[frame 59/64]
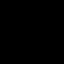

[Series 1: stress_(id)_sa · 6.5mm · 6.51mm/px · 6 of 512 frames shown (2 of 2)]
[frame 43/512]
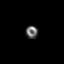
[frame 128/512]
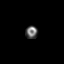
[frame 214/512]
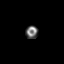
[frame 299/512]
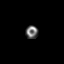
[frame 384/512]
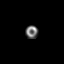
[frame 470/512]
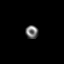

[18 of 18 positions shown; findings below may reference images not displayed]

Canned report from images found in remote index.

Refer to host system for actual result text.

## 2016-08-06 DIAGNOSIS — D2371 Other benign neoplasm of skin of right lower limb, including hip: Secondary | ICD-10-CM | POA: Diagnosis not present

## 2016-08-06 DIAGNOSIS — L570 Actinic keratosis: Secondary | ICD-10-CM | POA: Diagnosis not present

## 2016-08-06 DIAGNOSIS — L821 Other seborrheic keratosis: Secondary | ICD-10-CM | POA: Diagnosis not present

## 2016-08-06 DIAGNOSIS — D1801 Hemangioma of skin and subcutaneous tissue: Secondary | ICD-10-CM | POA: Diagnosis not present

## 2016-08-06 DIAGNOSIS — L661 Lichen planopilaris: Secondary | ICD-10-CM | POA: Diagnosis not present

## 2016-08-06 DIAGNOSIS — Z85828 Personal history of other malignant neoplasm of skin: Secondary | ICD-10-CM | POA: Diagnosis not present

## 2016-09-12 ENCOUNTER — Other Ambulatory Visit: Payer: Self-pay | Admitting: Diagnostic Neuroimaging

## 2016-09-16 ENCOUNTER — Telehealth: Payer: Self-pay | Admitting: Diagnostic Neuroimaging

## 2016-09-16 NOTE — Telephone Encounter (Signed)
[facility], spoke with Bridgett who stated patient has refills available. She stated they will fill and notify patient.

## 2016-09-16 NOTE — Telephone Encounter (Signed)
Pt calling for refill of  azaTHIOprine (IMURAN) 50 MG tablet  [facility], [place] AT [place] [phone] (Phone) [phone] (Fax)

## 2016-10-17 ENCOUNTER — Other Ambulatory Visit: Payer: Self-pay | Admitting: Diagnostic Neuroimaging

## 2016-10-18 ENCOUNTER — Ambulatory Visit: Payer: Medicare Other | Admitting: Diagnostic Neuroimaging

## 2016-12-18 ENCOUNTER — Ambulatory Visit (INDEPENDENT_AMBULATORY_CARE_PROVIDER_SITE_OTHER): Payer: Medicare Other | Admitting: Diagnostic Neuroimaging

## 2016-12-18 ENCOUNTER — Encounter: Payer: Self-pay | Admitting: Diagnostic Neuroimaging

## 2016-12-18 VITALS — BP 133/76 | HR 62 | Wt 193.6 lb

## 2016-12-18 DIAGNOSIS — Z79899 Other long term (current) drug therapy: Secondary | ICD-10-CM

## 2016-12-18 DIAGNOSIS — G7000 Myasthenia gravis without (acute) exacerbation: Secondary | ICD-10-CM

## 2016-12-18 DIAGNOSIS — Z9989 Dependence on other enabling machines and devices: Secondary | ICD-10-CM

## 2016-12-18 DIAGNOSIS — G4733 Obstructive sleep apnea (adult) (pediatric): Secondary | ICD-10-CM | POA: Diagnosis not present

## 2016-12-18 DIAGNOSIS — Z7960 Long term (current) use of unspecified immunomodulators and immunosuppressants: Secondary | ICD-10-CM

## 2016-12-18 MED ORDER — AZATHIOPRINE 50 MG PO TABS: 150.0000 mg | ORAL_TABLET | Freq: Every day | ORAL | 4 refills | Status: DC

## 2016-12-18 MED ORDER — PYRIDOSTIGMINE BROMIDE 60 MG PO TABS: 30.0000 mg | ORAL_TABLET | Freq: Three times a day (TID) | ORAL | 3 refills | Status: DC

## 2016-12-18 NOTE — Progress Notes (Signed)
[facility]  PATIENT: Oscar Jacobs DOB: 12-12-1940  REFERRING CLINICIAN:  HISTORY FROM: patient and wife REASON FOR VISIT: follow up   HISTORICAL  CHIEF COMPLAINT:  Chief Complaint  Patient presents with  . Myasthenia Gravis    rm 6, wife- [name], "doing well"  . OSA on CPAP    followed by Dr Brett Fairy  . Follow-up    8 month    HISTORY OF PRESENT ILLNESS:   UPDATE 12/18/16: Since last visit, doing well. Tolerating meds. Not SOB, ptosis or generalized weakness.  UPDATE 02/09/16: Since last visit, doing well. No new symptoms. Tolerating medications. Staying active with yard work.   UPDATE 08/09/15: Since last visit, doing well. Cardiology consult and workup completed and pt reassured. MG is stable. Tolerating meds.   UPDATE 02/08/15: Since last visit, doing well. No new events. Tolerating imuran and mestinon. Today has some low heart rate (not normal) and irregular rhythm. No chest pain or shortness of breath. Family hx of cardiac arrhythmia in brother and brother's son.   UPDATE 08/09/14: Since last visit, doing well. No new sxs. Stable on imuran and mestinon. No weakness or SOB.   UPDATE 02/07/14: Since last visit, MG is stable. No SOB, weakness, slurred speech or trouble swallowing. Has skin CA removed on leg (recurrent from 3 years ago). Has annual dermatology eval. Tolerating imuran 150mg  daily + mestinon 30mg  TID. Doing better with OSA and CPAP.  UPDATE 04/09/13: Continues to do well on imuran 150mg  daily. No side effects. Has been to PCP (Dr. Joylene Draft) for annual physical; PSA slightly elevated and has follow up. Patient has gained some weight (he thinks due to inactivity). Otherwise no concerns. No SOB, weakness, numbness.  UPDATE 12/29/12: Doing well on imuran 150mg  daily. No new sxs. No SOB. No weakness. Working in the yard.  UPDATE 11/06/12: Doing well on imuran 100mg  daily. No MG flares. Left ptosis is better. No mania or mood disturbances. Last  labs were improved. Taking mestinon 30mg  TID. Sometimes has diarrhea, and then takes 15mg  dose.  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 He was kindly and expertly managed by medical and neurohospitalists, and tapered off prednisone, treated with IVIG, and then started on imuran. Doing well now. Mania is almost fully resolved. Still a bit "hyper" per wife, but much improved. Platelets trended down near end of hospital stay (last 120). Here for follow up labs as well.  UPDATE 09/10/12: Since last visit, doing much better on prednisone. Now on prednisone 60mg  daily. Taking calcium + Vit D, omeprazole. No side effects, except for slightly "buzzy" mood. Sleep apnea better. Strength improved. Almost back to normal. Some left ptosis persists. Some chewing difficulty towards end of meals.  UPDATE 08/13/12: Since last visit, EMG and lab testing confirm myasthenia gravis diagnosis. I prescribed Mestinon for patient. He has had a positive response. He tried 30 mg 3 times a day, but now takes 15 mg 6 times a day, do to see increasing secretions and diarrhea. Medications lasting approximately 4 hours. No shortness of breath. Continues to have left ptosis, some neck weakness and left arm weakness. Some mild slurred speech. No trouble swallowing.  PRIOR HPI (08/06/12): 76 year old right-handed male with history of hypertension, seasonal allergies, here for evaluation of possible myasthenia gravis. 6 weeks ago patient noted left eyelid drooping. No double vision. Over the next few weeks he had increasing weakness in keeping his head upright as well as  difficulty with talking and chewing. Since this past Saturday symptoms have been significantly worse. He denies any shortness of breath or choking problems. He has mild weakness in his arms and no weakness in his legs. No headache, confusion, chest pain. No similar symptoms in the past. Patient does  have diagnosis of sleep apnea, but he has not used his CPAP machine last 2 or 3 years.   REVIEW OF SYSTEMS: Full 14 system review of systems performed and negative: diarrhea runny nose snoring apnea.    ALLERGIES: Allergies  Allergen Reactions  . Prednisone Other (See Comments)    manic  . Penicillins Rash  . Risperidone And Related Other (See Comments)    hyperactivity    HOME MEDICATIONS:  Outpatient Encounter Prescriptions as of 12/18/2016  Medication Sig  . aspirin EC 81 MG tablet Take 81 mg by mouth every morning.  Marland Kitchen azaTHIOprine (IMURAN) 50 MG tablet Take 3 tablets (150 mg total) by mouth daily.  . calcium-vitamin D (OSCAL WITH D) 500-200 MG-UNIT per tablet Take 1 tablet by mouth 2 (two) times daily.  . cholecalciferol (VITAMIN D) 1000 UNITS tablet Take 500 Units by mouth daily.   . Cyanocobalamin (VITAMIN B-12 CR PO) Take 1,250 mg by mouth daily.  . Multiple Vitamin (MULTIVITAMIN WITH MINERALS) TABS Take 1 tablet by mouth daily.  Marland Kitchen pyridostigmine (MESTINON) 60 MG tablet TAKE 1/2 TABLET BY MOUTH THREE TIMES DAILY  . ramipril (ALTACE) 10 MG capsule Take 10 mg by mouth daily.   No facility-administered encounter medications on file as of 12/18/2016.      PAST MEDICAL HISTORY: Past Medical History:  Diagnosis Date  . CPAP (continuous positive airway pressure) dependence   . GERD (gastroesophageal reflux disease)   . Hypertension   . Myasthenia gravis ([place])   . Sleep apnea   . Squamous carcinoma     PAST SURGICAL HISTORY: Past Surgical History:  Procedure Laterality Date  . Skin cancer removal      FAMILY HISTORY: Family History  Problem Relation Age of Onset  . Heart disease Mother   . Cancer - Other Father   . Heart disease Sister   . Heart disease Brother   . Bipolar disorder Sister   . Bipolar disorder Sister        suicide  . Healthy Brother     SOCIAL HISTORY:  Social History   Social History  . Marital status: Married    Spouse name:  Aayden Cefalu  . Number of children: 3  . Years of education: MD   Occupational History  . Retired     Radiology MD   Social History Main Topics  . Smoking status: Never Smoker  . Smokeless tobacco: Never Used  . Alcohol use Yes     Comment: seldom  . Drug use: No  . Sexual activity: Not on file   Other Topics Concern  . Not on file   Social History Narrative   Patient is married ([name]) and lives at home with his wife.   Patient has three adult children.   Patient is retired, Stage manager.   Married.  Ambulates unassisted.   Caffeine Use- 4 cups daily.   Patient is right-handed.   Patient has a college education.     PHYSICAL EXAM  Vitals:   12/18/16 0757  BP: 133/76  Pulse: 62  Weight: 193 lb 9.6 oz (87.8 kg)   Wt Readings from Last 3 Encounters:  12/18/16 193 lb 9.6 oz (87.8 kg)  06/03/16 193 lb (87.5 kg)  02/09/16 197 lb (89.4 kg)     Body mass index is 28.59 kg/m.  GENERAL EXAM: Patient is in no distress  CARDIOVASCULAR: BRADYCARDIA; no murmurs, no carotid bruits  NEUROLOGIC: MENTAL STATUS: awake, alert, language fluent, comprehension intact, naming intact CRANIAL NERVE: pupils equal and reactive to light, visual fields full to confrontation, extraocular muscles intact, no nystagmus, facial sensation and strength symmetric, uvula midline, shoulder shrug symmetric, tongue midline. NO PTOSIS. NO DOUBLE VISION MOTOR: NORMAL TONE; 5/5 IN BUE AND BLE.  SENSORY: normal and symmetric to light touch and vibration COORDINATION: finger-nose-finger, fine finger movements normal REFLEXES: deep tendon reflexes present and symmetric; TRACE AT ANKLES. GAIT/STATION: narrow based gait   DIAGNOSTIC DATA (LABS, IMAGING, TESTING) - I reviewed patient records, labs, notes, testing and imaging myself where available.  Lab Results  Component Value Date   WBC 5.2 08/01/2015   HGB 13.5 08/01/2015   HCT 39.4 08/01/2015   MCV 100 (H) 08/01/2015   PLT 187  08/01/2015   CMP Latest Ref Rng & Units 08/01/2015 08/03/2014 04/07/2013  Glucose 65 - 99 mg/dL 93 92 106(H)  BUN 8 - 27 mg/dL 28(H) 22 18  Creatinine 0.76 - 1.27 mg/dL 1.39(H) 1.31(H) 1.30(H)  Sodium 134 - 144 mmol/L 141 140 143  Potassium 3.5 - 5.2 mmol/L 4.6 4.8 4.2  Chloride 96 - 106 mmol/L 101 102 104  CO2 18 - 29 mmol/L 23 22 23   Calcium 8.6 - 10.2 mg/dL 9.6 9.1 8.9  Total Protein 6.0 - 8.5 g/dL 6.7 6.9 6.4  Total Bilirubin 0.0 - 1.2 mg/dL 0.7 0.6 0.4  Alkaline Phos 39 - 117 IU/L 50 50 55  AST 0 - 40 IU/L 24 20 23   ALT 0 - 44 IU/L 13 17 21     08/05/12 AchR binding 33.80 (h), AchR blocking 36% (h), AchR modulating 38% (h), anti-striational (1:80 (h)  08/05/12 B12 643, TSH 4.07, FT4 1.1, BUN/Cr 21/1.7  08/06/12 CT chest - no thymoma, small nodules within the thyroid gland are likely benign, left lower lobe pulmonary nodule, Bosniak I cyst of the right kidney is partially imaged, coronary artery calcifications.  08/06/12 EMG/NCS  1. Decremental response on repetitive nerve stimulation study of greater than 30%, consistent with neuromuscular junction disorder such as myasthenia gravis. 2. No evidence of underlying myopathy or motor neuropathy. 3. Decreased motor unit recruitment of the left flexor carpi radialis muscle may indicate underlying cervical radiculopathy (C6, C7), but not confirmed on paraspinal muscle testing or nerve conduction studies.  01/21/14 CBC (WBC 4.5, Plt 207, LFTs normal)   ASSESSMENT AND PLAN  76 y.o. year old male  has a past medical history of CPAP (continuous positive airway pressure) dependence; GERD (gastroesophageal reflux disease); Hypertension; Myasthenia gravis ([place]); Sleep apnea; and Squamous carcinoma. here with myasthenia gravis based on history and exam, EMG and lab testing. Did well with mestinon and prednisone until he developed steroid psychosis/mania. Now transitioned to imuran and doing well since July 2014.  Problem List:  Myasthenia gravis -  stable  Steroid psychosis/mania - RESOLVED  Thrombocytopenia - RESOLVED  Bradycardia and irregular heart rate - RESOLVED  Myasthenia gravis without exacerbation (HCC)  Long term current use of immunosuppressive drug  OSA on CPAP     PLAN: - continue pyridostigmine 30 mg TID - continue imuran 150mg  daily - continue CBC, CMP monitoring (for imuran) q48months  Meds ordered this encounter  Medications  . pyridostigmine (MESTINON) 60 MG tablet    Sig:  Take 0.5 tablets (30 mg total) by mouth 3 (three) times daily.    Dispense:  150 tablet    Refill:  3  . azaTHIOprine (IMURAN) 50 MG tablet    Sig: Take 3 tablets (150 mg total) by mouth daily.    Dispense:  270 tablet    Refill:  4   Return in about 9 months (around 09/17/2017).     Penni Bombard, MD 3/66/2947, 6:54 AM Certified in Neurology, Neurophysiology and Neuroimaging  [facility] [address], [place], [place] [address] [phone]

## 2017-02-22 DIAGNOSIS — Z23 Encounter for immunization: Secondary | ICD-10-CM | POA: Diagnosis not present

## 2017-03-15 ENCOUNTER — Other Ambulatory Visit: Payer: Self-pay | Admitting: Diagnostic Neuroimaging

## 2017-04-02 DIAGNOSIS — R82998 Other abnormal findings in urine: Secondary | ICD-10-CM | POA: Diagnosis not present

## 2017-04-02 DIAGNOSIS — E7849 Other hyperlipidemia: Secondary | ICD-10-CM | POA: Diagnosis not present

## 2017-04-02 DIAGNOSIS — Z125 Encounter for screening for malignant neoplasm of prostate: Secondary | ICD-10-CM | POA: Diagnosis not present

## 2017-04-02 DIAGNOSIS — I10 Essential (primary) hypertension: Secondary | ICD-10-CM | POA: Diagnosis not present

## 2017-04-02 DIAGNOSIS — R946 Abnormal results of thyroid function studies: Secondary | ICD-10-CM | POA: Diagnosis not present

## 2017-04-28 DIAGNOSIS — G7000 Myasthenia gravis without (acute) exacerbation: Secondary | ICD-10-CM | POA: Diagnosis not present

## 2017-04-28 DIAGNOSIS — D7589 Other specified diseases of blood and blood-forming organs: Secondary | ICD-10-CM | POA: Diagnosis not present

## 2017-04-28 DIAGNOSIS — R3129 Other microscopic hematuria: Secondary | ICD-10-CM | POA: Diagnosis not present

## 2017-04-28 DIAGNOSIS — Z6825 Body mass index (BMI) 25.0-25.9, adult: Secondary | ICD-10-CM | POA: Diagnosis not present

## 2017-04-28 DIAGNOSIS — I10 Essential (primary) hypertension: Secondary | ICD-10-CM | POA: Diagnosis not present

## 2017-04-28 DIAGNOSIS — N183 Chronic kidney disease, stage 3 (moderate): Secondary | ICD-10-CM | POA: Diagnosis not present

## 2017-04-28 DIAGNOSIS — R001 Bradycardia, unspecified: Secondary | ICD-10-CM | POA: Diagnosis not present

## 2017-04-28 DIAGNOSIS — D6489 Other specified anemias: Secondary | ICD-10-CM | POA: Diagnosis not present

## 2017-04-28 DIAGNOSIS — E7849 Other hyperlipidemia: Secondary | ICD-10-CM | POA: Diagnosis not present

## 2017-04-28 DIAGNOSIS — Z1389 Encounter for screening for other disorder: Secondary | ICD-10-CM | POA: Diagnosis not present

## 2017-04-28 DIAGNOSIS — Z0000 Encounter for general adult medical examination without abnormal findings: Secondary | ICD-10-CM | POA: Diagnosis not present

## 2017-04-28 DIAGNOSIS — R972 Elevated prostate specific antigen [PSA]: Secondary | ICD-10-CM | POA: Diagnosis not present

## 2017-05-02 DIAGNOSIS — R972 Elevated prostate specific antigen [PSA]: Secondary | ICD-10-CM | POA: Diagnosis not present

## 2017-05-27 DIAGNOSIS — D649 Anemia, unspecified: Secondary | ICD-10-CM | POA: Diagnosis not present

## 2017-05-27 DIAGNOSIS — D696 Thrombocytopenia, unspecified: Secondary | ICD-10-CM | POA: Diagnosis not present

## 2017-05-27 DIAGNOSIS — D7589 Other specified diseases of blood and blood-forming organs: Secondary | ICD-10-CM | POA: Diagnosis not present

## 2017-06-02 ENCOUNTER — Encounter: Payer: Self-pay | Admitting: Diagnostic Neuroimaging

## 2017-06-09 ENCOUNTER — Ambulatory Visit (INDEPENDENT_AMBULATORY_CARE_PROVIDER_SITE_OTHER): Payer: Medicare Other | Admitting: Adult Health

## 2017-06-09 ENCOUNTER — Ambulatory Visit: Payer: Medicare Other | Admitting: Neurology

## 2017-06-09 ENCOUNTER — Encounter: Payer: Self-pay | Admitting: Adult Health

## 2017-06-09 VITALS — BP 157/79 | HR 62 | Ht 69.0 in | Wt 190.6 lb

## 2017-06-09 DIAGNOSIS — Z9989 Dependence on other enabling machines and devices: Secondary | ICD-10-CM

## 2017-06-09 DIAGNOSIS — G4733 Obstructive sleep apnea (adult) (pediatric): Secondary | ICD-10-CM

## 2017-06-09 NOTE — Progress Notes (Signed)
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  He returns today for compliance download.  He states that at the beginning of July he was unable to use his machine.  He reports that anytime he put his mask on his nasal passages would occlude.  He reports that he was very congested.  He reports he began blowing his nose and actually blew out a nasal polyp.  He states that since then he has been able to breathe better and using the CPAP.  His download today states that he uses a CPAP of 14 out of 30 days for compliance of 47%.  He uses machine greater than 4 hours each night.  On average he uses it 7 hours and 21-minute.  His residual AHI is 2.5 on 10 cm of water with EPR 3.  His compliance has been excellent.  He was unable to use the CPAP consistently in the last 3 days due to nasal polyp.  He returns today for an evaluation.   HISTORY He has been 100% compliance for the last 30 days, with an average user time of 5 hours and 59 minutes his CPAP machine is set at 10 cm water pressure, his previous CPAP at been set at 13. He is doing excellently with a 3 cm water EPR, also known as expiratory pressure relief and a residual AHI at 2.5. He does have some air leaks but they do not affect him on most days. In short I would not have to reset the machine to any other pressures his fatigue severity scale is in normal range at 24 points, his Epworth Sleepiness Scale result is excellent at 5 points. He has frequent sinusitis, mostly affecting the right. Sinusitis will sometimes drain and "strangling " him. About a month ago, he has a period of 3 days with very dry airway. Used a Designer, television/film set in the bedroom and felt better.   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 I do have model of the travel  CPAP but I can show him in the sleep lab. I know that the purchase price is close $1000. However this machine is much smaller very quiet and will download dated to the smart phone so that date I can later be merged into one record for compliance by [place].   REVIEW OF SYSTEMS: Out of a complete 14 system review of symptoms, the patient complains only of the following symptoms, and all other reviewed systems are negative.  See HPI  ALLERGIES: Allergies  Allergen Reactions  . Prednisone Other (See Comments)    manic  . Penicillins Rash  . Risperidone And Related Other (See Comments)    hyperactivity    HOME MEDICATIONS: Outpatient Medications Prior to Visit  Medication Sig Dispense Refill  . azaTHIOprine (IMURAN) 50 MG tablet Take 3 tablets (150 mg total) by mouth daily. 270 tablet 4  . calcium-vitamin D (OSCAL WITH D) 500-200 MG-UNIT per tablet Take 1 tablet by mouth 2 (two) times daily.    . cholecalciferol (VITAMIN D) 1000 UNITS tablet Take 500 Units by mouth daily.     . Cyanocobalamin (VITAMIN B-12 CR PO) Take 1,250 mg by mouth daily.    . Multiple Vitamin (MULTIVITAMIN WITH MINERALS) TABS Take 1 tablet by mouth daily.    Marland Kitchen  pyridostigmine (MESTINON) 60 MG tablet Take 0.5 tablets (30 mg total) by mouth 3 (three) times daily. 150 tablet 3  . ramipril (ALTACE) 10 MG capsule Take 10 mg by mouth daily.    Marland Kitchen aspirin EC 81 MG tablet Take 81 mg by mouth every morning.     No facility-administered medications prior to visit.     PAST MEDICAL HISTORY: Past Medical History:  Diagnosis Date  . CPAP (continuous positive airway pressure) dependence   . GERD (gastroesophageal reflux disease)   . Hypertension   . Myasthenia gravis ([place])   . Sleep apnea   . Squamous carcinoma     PAST SURGICAL HISTORY: Past Surgical History:  Procedure Laterality Date  . Skin cancer removal      FAMILY HISTORY: Family History  Problem Relation Age of Onset  . Heart disease Mother     . Cancer - Other Father   . Heart disease Sister   . Heart disease Brother   . Bipolar disorder Sister   . Bipolar disorder Sister        suicide  . Healthy Brother     SOCIAL HISTORY: Social History   Socioeconomic History  . Marital status: Married    Spouse name: Decklyn Hyder  . Number of children: 3  . Years of education: MD  . Highest education level: Not on file  Social Needs  . Financial resource strain: Not on file  . Food insecurity - worry: Not on file  . Food insecurity - inability: Not on file  . Transportation needs - medical: Not on file  . Transportation needs - non-medical: Not on file  Occupational History  . Occupation: Retired    Comment: Radiology MD  Tobacco Use  . Smoking status: Never Smoker  . Smokeless tobacco: Never Used  Substance and Sexual Activity  . Alcohol use: Yes    Comment: seldom  . Drug use: No  . Sexual activity: Not on file  Other Topics Concern  . Not on file  Social History Narrative   Patient is married (Oscar Jacobs) and lives at home with his wife.   Patient has three adult children.   Patient is retired, Stage manager.   Married.  Ambulates unassisted.   Caffeine Use- 4 cups daily.   Patient is right-handed.   Patient has a college education.      PHYSICAL EXAM  Vitals:   06/09/17 1359  BP: (!) 157/79  Pulse: 62  Weight: 190 lb 10.1 oz (86.5 kg)  Height: 5\' 9"  (1.753 m)   Body mass index is 28.15 kg/m.  Generalized: Well developed, in no acute distress   Neurological examination  Mentation: Alert oriented to time, place, history taking. Follows all commands speech and language fluent Cranial nerve II-XII: Pupils were equal round reactive to light. Extraocular movements were full, visual field were full on confrontational test. Facial sensation and strength were normal. Uvula tongue midline. Head turning and shoulder shrug  were normal and symmetric. Motor: The motor testing reveals 5 over 5 strength of  all 4 extremities. Good symmetric motor tone is noted throughout.  Sensory: Sensory testing is intact to soft touch on all 4 extremities. No evidence of extinction is noted.  Coordination: Cerebellar testing reveals good finger-nose-finger and heel-to-shin bilaterally.  Gait and station: Gait is normal. Reflexes: Deep tendon reflexes are symmetric and normal bilaterally.   DIAGNOSTIC DATA (LABS, IMAGING, TESTING) - I reviewed patient records, labs, notes, testing and imaging myself where available.  Lab  Results  Component Value Date   WBC 5.2 08/01/2015   HGB 13.5 08/01/2015   HCT 39.4 08/01/2015   MCV 100 (H) 08/01/2015   PLT 187 08/01/2015      Component Value Date/Time   NA 141 08/01/2015 1003   K 4.6 08/01/2015 1003   CL 101 08/01/2015 1003   CO2 23 08/01/2015 1003   GLUCOSE 93 08/01/2015 1003   GLUCOSE 91 09/21/2012 0421   BUN 28 (H) 08/01/2015 1003   CREATININE 1.39 (H) 08/01/2015 1003   CALCIUM 9.6 08/01/2015 1003   PROT 6.7 08/01/2015 1003   ALBUMIN 4.3 08/01/2015 1003   AST 24 08/01/2015 1003   ALT 13 08/01/2015 1003   ALKPHOS 50 08/01/2015 1003   BILITOT 0.7 08/01/2015 1003   GFRNONAA 49 (L) 08/01/2015 1003   GFRAA 57 (L) 08/01/2015 1003      ASSESSMENT AND PLAN 77 y.o. year old male  has a past medical history of CPAP (continuous positive airway pressure) dependence, GERD (gastroesophageal reflux disease), Hypertension, Myasthenia gravis ([place]), Sleep apnea, and Squamous carcinoma. here with:  1.  Obstructive sleep apnea on CPAP  The patient was unable to use his CPAP consistently for the past month due to a nasal polyp.  Otherwise he is typically compliant with CPAP usage.  He is encouraged to continue using his CPAP nightly.  He is advised that if his symptoms worsen or he develops new symptoms he should let us know.  He will follow-up in 6 months or sooner if needed.  I spent 15 minutes with the patient. 50% of this time was spent reviewing CPAP  download      Ward Givens, MSN, NP-C 06/09/2017, 2:25 PM [facility] [address] [place], [place] [address] [phone]

## 2017-06-09 NOTE — Progress Notes (Signed)
I have read the note, and I agree with the clinical assessment and plan.  Charles K Willis   

## 2017-06-09 NOTE — Patient Instructions (Signed)
Your Plan:  Continue CPAP nightly  If your symptoms worsen or you develop new symptoms please let us know.   Thank you for coming to see [place] at [facility]. I hope we have been able to provide you high quality care today.  You may receive a patient satisfaction survey over the next few weeks. We would appreciate your feedback and comments so that we may continue to improve ourselves and the health of our patients.

## 2017-06-18 ENCOUNTER — Other Ambulatory Visit: Payer: Self-pay | Admitting: Urology

## 2017-06-18 DIAGNOSIS — R972 Elevated prostate specific antigen [PSA]: Secondary | ICD-10-CM

## 2017-07-07 ENCOUNTER — Ambulatory Visit
Admission: RE | Admit: 2017-07-07 | Discharge: 2017-07-07 | Disposition: A | Discharge status: Home / Self Care | Payer: Medicare Other | Source: Ambulatory Visit | Attending: Urology | Admitting: Urology

## 2017-07-07 DIAGNOSIS — R972 Elevated prostate specific antigen [PSA]: Secondary | ICD-10-CM | POA: Diagnosis not present

## 2017-07-07 MED ORDER — GADOBENATE DIMEGLUMINE 529 MG/ML IV SOLN
19.0000 mL | Freq: Once | INTRAVENOUS | Status: AC | PRN
  Administered 2017-07-07: 19 mL via INTRAVENOUS

## 2017-08-06 DIAGNOSIS — Z85828 Personal history of other malignant neoplasm of skin: Secondary | ICD-10-CM | POA: Diagnosis not present

## 2017-08-06 DIAGNOSIS — D485 Neoplasm of uncertain behavior of skin: Secondary | ICD-10-CM | POA: Diagnosis not present

## 2017-08-06 DIAGNOSIS — D0462 Carcinoma in situ of skin of left upper limb, including shoulder: Secondary | ICD-10-CM | POA: Diagnosis not present

## 2017-08-06 DIAGNOSIS — L720 Epidermal cyst: Secondary | ICD-10-CM | POA: Diagnosis not present

## 2017-08-06 DIAGNOSIS — D2372 Other benign neoplasm of skin of left lower limb, including hip: Secondary | ICD-10-CM | POA: Diagnosis not present

## 2017-08-06 DIAGNOSIS — L570 Actinic keratosis: Secondary | ICD-10-CM | POA: Diagnosis not present

## 2017-08-08 DIAGNOSIS — N3941 Urge incontinence: Secondary | ICD-10-CM | POA: Diagnosis not present

## 2017-08-08 DIAGNOSIS — C61 Malignant neoplasm of prostate: Secondary | ICD-10-CM | POA: Diagnosis not present

## 2017-08-08 DIAGNOSIS — R972 Elevated prostate specific antigen [PSA]: Secondary | ICD-10-CM | POA: Diagnosis not present

## 2017-08-19 ENCOUNTER — Ambulatory Visit: Payer: Medicare Other | Admitting: Diagnostic Neuroimaging

## 2017-09-02 ENCOUNTER — Other Ambulatory Visit: Payer: Self-pay | Admitting: Diagnostic Neuroimaging

## 2017-10-28 DIAGNOSIS — R972 Elevated prostate specific antigen [PSA]: Secondary | ICD-10-CM | POA: Diagnosis not present

## 2017-11-13 DIAGNOSIS — C61 Malignant neoplasm of prostate: Secondary | ICD-10-CM | POA: Diagnosis not present

## 2017-11-25 ENCOUNTER — Encounter: Payer: Self-pay | Admitting: Diagnostic Neuroimaging

## 2017-11-25 ENCOUNTER — Ambulatory Visit (INDEPENDENT_AMBULATORY_CARE_PROVIDER_SITE_OTHER): Payer: Medicare Other | Admitting: Diagnostic Neuroimaging

## 2017-11-25 VITALS — BP 140/80 | HR 64 | Ht 69.0 in | Wt 193.0 lb

## 2017-11-25 DIAGNOSIS — G7000 Myasthenia gravis without (acute) exacerbation: Secondary | ICD-10-CM | POA: Diagnosis not present

## 2017-11-25 DIAGNOSIS — Z79899 Other long term (current) drug therapy: Secondary | ICD-10-CM | POA: Diagnosis not present

## 2017-11-25 MED ORDER — AZATHIOPRINE 50 MG PO TABS: 150.0000 mg | ORAL_TABLET | Freq: Every day | ORAL | 4 refills | Status: DC

## 2017-11-25 MED ORDER — PYRIDOSTIGMINE BROMIDE 60 MG PO TABS: 30.0000 mg | ORAL_TABLET | Freq: Three times a day (TID) | ORAL | 3 refills | Status: DC

## 2017-11-25 NOTE — Progress Notes (Addendum)
[facility]  PATIENT: Oscar Jacobs DOB: July 06, 1940  REFERRING CLINICIAN:  HISTORY FROM: patient and wife REASON FOR VISIT: follow up   HISTORICAL  CHIEF COMPLAINT:  Chief Complaint  Patient presents with  . Follow-up  . Myasthenia Gravis    HISTORY OF PRESENT ILLNESS:   UPDATE (11/25/17, VRP): Since last visit, doing well. Symptoms are mild. Severity is mild. No alleviating or aggravating factors. Tolerating meds. Has had new PSA elevation, and biopsy (low grade --> monitoring for now).   UPDATE 12/18/16: Since last visit, doing well. Tolerating meds. Not SOB, ptosis or generalized weakness.  UPDATE 02/09/16: Since last visit, doing well. No new symptoms. Tolerating medications. Staying active with yard work.   UPDATE 08/09/15: Since last visit, doing well. Cardiology consult and workup completed and pt reassured. MG is stable. Tolerating meds.   UPDATE 02/08/15: Since last visit, doing well. No new events. Tolerating imuran and mestinon. Today has some low heart rate (not normal) and irregular rhythm. No chest pain or shortness of breath. Family hx of cardiac arrhythmia in brother and brother's son.   UPDATE 08/09/14: Since last visit, doing well. No new sxs. Stable on imuran and mestinon. No weakness or SOB.   UPDATE 02/07/14: Since last visit, MG is stable. No SOB, weakness, slurred speech or trouble swallowing. Has skin CA removed on leg (recurrent from 3 years ago). Has annual dermatology eval. Tolerating imuran 150mg  daily + mestinon 30mg  TID. Doing better with OSA and CPAP.  UPDATE 04/09/13: Continues to do well on imuran 150mg  daily. No side effects. Has been to PCP (Dr. Joylene Draft) for annual physical; PSA slightly elevated and has follow up. Patient has gained some weight (he thinks due to inactivity). Otherwise no concerns. No SOB, weakness, numbness.  UPDATE 12/29/12: Doing well on imuran 150mg  daily. No new sxs. No SOB. No weakness. Working in the  yard.  UPDATE 11/06/12: Doing well on imuran 100mg  daily. No MG flares. Left ptosis is better. No mania or mood disturbances. Last labs were improved. Taking mestinon 30mg  TID. Sometimes has diarrhea, and then takes 15mg  dose.  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 He was kindly and expertly managed by medical and neurohospitalists, and tapered off prednisone, treated with IVIG, and then started on imuran. Doing well now. Mania is almost fully resolved. Still a bit "hyper" per wife, but much improved. Platelets trended down near end of hospital stay (last 120). Here for follow up labs as well.  UPDATE 09/10/12: Since last visit, doing much better on prednisone. Now on prednisone 60mg  daily. Taking calcium + Vit D, omeprazole. No side effects, except for slightly "buzzy" mood. Sleep apnea better. Strength improved. Almost back to normal. Some left ptosis persists. Some chewing difficulty towards end of meals.  UPDATE 08/13/12: Since last visit, EMG and lab testing confirm myasthenia gravis diagnosis. I prescribed Mestinon for patient. He has had a positive response. He tried 30 mg 3 times a day, but now takes 15 mg 6 times a day, do to see increasing secretions and diarrhea. Medications lasting approximately 4 hours. No shortness of breath. Continues to have left ptosis, some neck weakness and left arm weakness. Some mild slurred speech. No trouble swallowing.  PRIOR HPI (08/06/12): 77 year old right-handed male with history of hypertension, seasonal allergies, here for evaluation of possible myasthenia gravis. 6 weeks ago patient noted left eyelid drooping. No double vision. Over the next few weeks he had  increasing weakness in keeping his head upright as well as difficulty with talking and chewing. Since this past Saturday symptoms have been significantly worse. He denies any shortness of breath or choking problems. He has mild  weakness in his arms and no weakness in his legs. No headache, confusion, chest pain. No similar symptoms in the past. Patient does have diagnosis of sleep apnea, but he has not used his CPAP machine last 2 or 3 years.   REVIEW OF SYSTEMS: Full 14 system review of systems performed and negative: diarrhea runny nose snoring apnea.    ALLERGIES: Allergies  Allergen Reactions  . Prednisone Other (See Comments)    manic  . Penicillins Rash  . Risperidone And Related Other (See Comments)    hyperactivity    HOME MEDICATIONS:  Outpatient Encounter Medications as of 78/30/2019  Medication Sig  . azaTHIOprine (IMURAN) 50 MG tablet Take 3 tablets (150 mg total) by mouth daily.  . calcium-vitamin D (OSCAL WITH D) 500-200 MG-UNIT per tablet Take 1 tablet by mouth 2 (two) times daily.  . cholecalciferol (VITAMIN D) 1000 UNITS tablet Take 500 Units by mouth daily.   . Cyanocobalamin (VITAMIN B-12 CR PO) Take 1,250 mg by mouth daily.  . Multiple Vitamin (MULTIVITAMIN WITH MINERALS) TABS Take 1 tablet by mouth daily.  Marland Kitchen pyridostigmine (MESTINON) 60 MG tablet Take 0.5 tablets (30 mg total) by mouth 3 (three) times daily.  . ramipril (ALTACE) 10 MG capsule Take 10 mg by mouth daily.   No facility-administered encounter medications on file as of 11/25/2017.      PAST MEDICAL HISTORY: Past Medical History:  Diagnosis Date  . CPAP (continuous positive airway pressure) dependence   . GERD (gastroesophageal reflux disease)   . Hypertension   . Myasthenia gravis ([place])   . Sleep apnea   . Squamous carcinoma     PAST SURGICAL HISTORY: Past Surgical History:  Procedure Laterality Date  . Skin cancer removal      FAMILY HISTORY: Family History  Problem Relation Age of Onset  . Heart disease Mother   . Cancer - Other Father   . Heart disease Sister   . Heart disease Brother   . Bipolar disorder Sister   . Bipolar disorder Sister        suicide  . Healthy Brother     SOCIAL  HISTORY:  Social History   Socioeconomic History  . Marital status: Married    Spouse name: [name]  . Number of children: 3  . Years of education: MD  . Highest education level: Not on file  Occupational History  . Occupation: Retired    Comment: Radiology MD  Social Needs  . Financial resource strain: Not on file  . Food insecurity:    Worry: Not on file    Inability: Not on file  . Transportation needs:    Medical: Not on file    Non-medical: Not on file  Tobacco Use  . Smoking status: Never Smoker  . Smokeless tobacco: Never Used  Substance and Sexual Activity  . Alcohol use: Yes    Comment: seldom  . Drug use: No  . Sexual activity: Not on file  Lifestyle  . Physical activity:    Days per week: Not on file    Minutes per session: Not on file  . Stress: Not on file  Relationships  . Social connections:    Talks on phone: Not on file    Gets together: Not on file  Attends religious service: Not on file    Active member of club or organization: Not on file    Attends meetings of clubs or organizations: Not on file    Relationship status: Not on file  . Intimate partner violence:    Fear of current or ex partner: Not on file    Emotionally abused: Not on file    Physically abused: Not on file    Forced sexual activity: Not on file  Other Topics Concern  . Not on file  Social History Narrative   Patient is married ([name]) and lives at home with his wife.   Patient has three adult children.   Patient is retired, Stage manager.   Married.  Ambulates unassisted.   Caffeine Use- 4 cups daily.   Patient is right-handed.   Patient has a college education.     PHYSICAL EXAM  GENERAL EXAM/CONSTITUTIONAL: Vitals:  Vitals:   11/25/17 0852  BP: 140/80  Pulse: 64  Weight: 193 lb (87.5 kg)  Height: 5\' 9"  (1.753 m)     Body mass index is 28.5 kg/m. Wt Readings from Last 3 Encounters:  11/25/17 193 lb (87.5 kg)  06/09/17 190 lb 10.1 oz  (86.5 kg)  12/18/16 193 lb 9.6 oz (87.8 kg)     Patient is in no distress; well developed, nourished and groomed; neck is supple  CARDIOVASCULAR:  Examination of carotid arteries is normal; no carotid bruits  Regular rate and rhythm, no murmurs  Examination of peripheral vascular system by observation and palpation is normal  EYES:  Ophthalmoscopic exam of optic discs and posterior segments is normal; no papilledema or hemorrhages  No exam data present  MUSCULOSKELETAL:  Gait, strength, tone, movements noted in Neurologic exam below  NEUROLOGIC: MENTAL STATUS:  No flowsheet data found.  awake, alert, oriented to person, place and time  recent and remote memory intact  normal attention and concentration  language fluent, comprehension intact, naming intact  fund of knowledge appropriate  CRANIAL NERVE:   2nd - no papilledema on fundoscopic exam  2nd, 3rd, 4th, 6th - pupils equal and reactive to light, visual fields full to confrontation, extraocular muscles intact, no nystagmus --> EXCEPT MILD LEFT > RIGHT PTOSIS  5th - facial sensation symmetric  7th - facial strength symmetric  8th - hearing intact  9th - palate elevates symmetrically, uvula midline  11th - shoulder shrug symmetric  12th - tongue protrusion midline  MOTOR:   normal bulk and tone, full strength in the BUE, BLE  SENSORY:   normal and symmetric to light touch  COORDINATION:   finger-nose-finger, fine finger movements normal  REFLEXES:   deep tendon reflexes TRACE and symmetric  GAIT/STATION:   narrow based gait     DIAGNOSTIC DATA (LABS, IMAGING, TESTING) - I reviewed patient records, labs, notes, testing and imaging myself where available.  Lab Results  Component Value Date   WBC 5.2 08/01/2015   HGB 13.5 08/01/2015   HCT 39.4 08/01/2015   MCV 100 (H) 08/01/2015   PLT 187 08/01/2015   CMP Latest Ref Rng & Units 08/01/2015 08/03/2014 04/07/2013  Glucose 65 - 99  mg/dL 93 92 106(H)  BUN 8 - 27 mg/dL 28(H) 22 18  Creatinine 0.76 - 1.27 mg/dL 1.39(H) 1.31(H) 1.30(H)  Sodium 134 - 144 mmol/L 141 140 143  Potassium 3.5 - 5.2 mmol/L 4.6 4.8 4.2  Chloride 96 - 106 mmol/L 101 102 104  CO2 18 - 29 mmol/L 23 22 23   Calcium  8.6 - 10.2 mg/dL 9.6 9.1 8.9  Total Protein 6.0 - 8.5 g/dL 6.7 6.9 6.4  Total Bilirubin 0.0 - 1.2 mg/dL 0.7 0.6 0.4  Alkaline Phos 39 - 117 IU/L 50 50 55  AST 0 - 40 IU/L 24 20 23   ALT 0 - 44 IU/L 13 17 21     08/05/12 AchR binding 33.80 (h), AchR blocking 36% (h), AchR modulating 38% (h), anti-striational (1:80 (h)  08/05/12 B12 643, TSH 4.07, FT4 1.1, BUN/Cr 21/1.7  08/06/12 CT chest - no thymoma, small nodules within the thyroid gland are likely benign, left lower lobe pulmonary nodule, Bosniak I cyst of the right kidney is partially imaged, coronary artery calcifications.  08/06/12 EMG/NCS  1. Decremental response on repetitive nerve stimulation study of greater than 30%, consistent with neuromuscular junction disorder such as myasthenia gravis. 2. No evidence of underlying myopathy or motor neuropathy. 3. Decreased motor unit recruitment of the left flexor carpi radialis muscle may indicate underlying cervical radiculopathy (C6, C7), but not confirmed on paraspinal muscle testing or nerve conduction studies.  01/21/14 CBC (WBC 4.5, Plt 207, LFTs normal)   ASSESSMENT AND PLAN  77 y.o. year old male  has a past medical history of CPAP (continuous positive airway pressure) dependence, GERD (gastroesophageal reflux disease), Hypertension, Myasthenia gravis ([place]), Sleep apnea, and Squamous carcinoma. here with myasthenia gravis based on history and exam, EMG and lab testing. Did well with mestinon and prednisone until he developed steroid psychosis/mania. Now transitioned to imuran and doing well since July 2014.  Problem List:  Myasthenia gravis - stable  Steroid psychosis/mania - RESOLVED  Thrombocytopenia - RESOLVED  Bradycardia  and irregular heart rate - RESOLVED  Myasthenia gravis without exacerbation (HCC), Chronic  High risk medication use     PLAN:  MYASTHENIA GRAVIS (established, stable) - stable; continue pyridostigmine 30mg  three times a day + imuran 150mg  daily - monitor labs (for imuran) q6 months  OSA on CPAP (established, stable) - stable; per Dr. Brett Fairy  Meds ordered this encounter  Medications  . pyridostigmine (MESTINON) 60 MG tablet    Sig: Take 0.5 tablets (30 mg total) by mouth 3 (three) times daily.    Dispense:  150 tablet    Refill:  3  . azaTHIOprine (IMURAN) 50 MG tablet    Sig: Take 3 tablets (150 mg total) by mouth daily.    Dispense:  270 tablet    Refill:  4   Return in about 9 months (around 08/27/2018).     Penni Bombard, MD 5/62/5638, 9:37 AM Certified in Neurology, Neurophysiology and Neuroimaging  [facility] [address] [place], [place] [address] [phone]

## 2018-01-20 DIAGNOSIS — Z23 Encounter for immunization: Secondary | ICD-10-CM | POA: Diagnosis not present

## 2018-02-04 DIAGNOSIS — C44622 Squamous cell carcinoma of skin of right upper limb, including shoulder: Secondary | ICD-10-CM | POA: Diagnosis not present

## 2018-02-04 DIAGNOSIS — Z85828 Personal history of other malignant neoplasm of skin: Secondary | ICD-10-CM | POA: Diagnosis not present

## 2018-02-04 DIAGNOSIS — L570 Actinic keratosis: Secondary | ICD-10-CM | POA: Diagnosis not present

## 2018-02-04 DIAGNOSIS — D485 Neoplasm of uncertain behavior of skin: Secondary | ICD-10-CM | POA: Diagnosis not present

## 2018-02-19 ENCOUNTER — Other Ambulatory Visit: Payer: Self-pay | Admitting: Diagnostic Neuroimaging

## 2018-03-08 ENCOUNTER — Other Ambulatory Visit: Payer: Self-pay | Admitting: Diagnostic Neuroimaging

## 2018-05-08 DIAGNOSIS — E7849 Other hyperlipidemia: Secondary | ICD-10-CM | POA: Diagnosis not present

## 2018-05-08 DIAGNOSIS — N183 Chronic kidney disease, stage 3 (moderate): Secondary | ICD-10-CM | POA: Diagnosis not present

## 2018-05-08 DIAGNOSIS — Z125 Encounter for screening for malignant neoplasm of prostate: Secondary | ICD-10-CM | POA: Diagnosis not present

## 2018-05-08 DIAGNOSIS — R82998 Other abnormal findings in urine: Secondary | ICD-10-CM | POA: Diagnosis not present

## 2018-05-08 DIAGNOSIS — R946 Abnormal results of thyroid function studies: Secondary | ICD-10-CM | POA: Diagnosis not present

## 2018-05-12 DIAGNOSIS — Z1212 Encounter for screening for malignant neoplasm of rectum: Secondary | ICD-10-CM | POA: Diagnosis not present

## 2018-05-15 DIAGNOSIS — D6489 Other specified anemias: Secondary | ICD-10-CM | POA: Diagnosis not present

## 2018-05-15 DIAGNOSIS — I10 Essential (primary) hypertension: Secondary | ICD-10-CM | POA: Diagnosis not present

## 2018-05-15 DIAGNOSIS — N183 Chronic kidney disease, stage 3 (moderate): Secondary | ICD-10-CM | POA: Diagnosis not present

## 2018-05-15 DIAGNOSIS — R808 Other proteinuria: Secondary | ICD-10-CM | POA: Diagnosis not present

## 2018-05-15 DIAGNOSIS — Z1389 Encounter for screening for other disorder: Secondary | ICD-10-CM | POA: Diagnosis not present

## 2018-05-15 DIAGNOSIS — Z6826 Body mass index (BMI) 26.0-26.9, adult: Secondary | ICD-10-CM | POA: Diagnosis not present

## 2018-05-15 DIAGNOSIS — D7589 Other specified diseases of blood and blood-forming organs: Secondary | ICD-10-CM | POA: Diagnosis not present

## 2018-05-15 DIAGNOSIS — G7000 Myasthenia gravis without (acute) exacerbation: Secondary | ICD-10-CM | POA: Diagnosis not present

## 2018-05-15 DIAGNOSIS — D126 Benign neoplasm of colon, unspecified: Secondary | ICD-10-CM | POA: Diagnosis not present

## 2018-05-15 DIAGNOSIS — C61 Malignant neoplasm of prostate: Secondary | ICD-10-CM | POA: Diagnosis not present

## 2018-05-15 DIAGNOSIS — Z0000 Encounter for general adult medical examination without abnormal findings: Secondary | ICD-10-CM | POA: Diagnosis not present

## 2018-05-15 DIAGNOSIS — D696 Thrombocytopenia, unspecified: Secondary | ICD-10-CM | POA: Diagnosis not present

## 2018-05-19 ENCOUNTER — Other Ambulatory Visit: Payer: Self-pay | Admitting: Internal Medicine

## 2018-05-19 DIAGNOSIS — E785 Hyperlipidemia, unspecified: Secondary | ICD-10-CM

## 2018-05-25 ENCOUNTER — Ambulatory Visit
Admission: RE | Admit: 2018-05-25 | Discharge: 2018-05-25 | Disposition: A | Discharge status: Home / Self Care | Payer: No Typology Code available for payment source | Source: Ambulatory Visit | Attending: Internal Medicine | Admitting: Internal Medicine

## 2018-05-25 DIAGNOSIS — E785 Hyperlipidemia, unspecified: Secondary | ICD-10-CM

## 2018-05-29 DIAGNOSIS — H2513 Age-related nuclear cataract, bilateral: Secondary | ICD-10-CM | POA: Diagnosis not present

## 2018-06-01 ENCOUNTER — Other Ambulatory Visit (HOSPITAL_COMMUNITY): Payer: Self-pay | Admitting: Internal Medicine

## 2018-06-01 ENCOUNTER — Ambulatory Visit (HOSPITAL_COMMUNITY)
Admission: RE | Admit: 2018-06-01 | Discharge: 2018-06-01 | Disposition: A | Discharge status: Home / Self Care | Payer: Self-pay | Source: Ambulatory Visit | Attending: Family | Admitting: Family

## 2018-06-01 DIAGNOSIS — R0989 Other specified symptoms and signs involving the circulatory and respiratory systems: Secondary | ICD-10-CM | POA: Insufficient documentation

## 2018-06-08 ENCOUNTER — Ambulatory Visit (INDEPENDENT_AMBULATORY_CARE_PROVIDER_SITE_OTHER): Payer: Medicare Other | Admitting: Neurology

## 2018-06-08 ENCOUNTER — Encounter: Payer: Self-pay | Admitting: Neurology

## 2018-06-08 VITALS — BP 142/84 | HR 75 | Ht 70.0 in | Wt 190.0 lb

## 2018-06-08 DIAGNOSIS — G7000 Myasthenia gravis without (acute) exacerbation: Secondary | ICD-10-CM | POA: Insufficient documentation

## 2018-06-08 DIAGNOSIS — G4733 Obstructive sleep apnea (adult) (pediatric): Secondary | ICD-10-CM | POA: Diagnosis not present

## 2018-06-08 DIAGNOSIS — Z9989 Dependence on other enabling machines and devices: Secondary | ICD-10-CM | POA: Diagnosis not present

## 2018-06-08 NOTE — Patient Instructions (Signed)
Sleep Apnea Sleep apnea is a condition in which breathing pauses or becomes shallow during sleep. Episodes of sleep apnea usually last 10 seconds or longer, and they may occur as many as 20 times an hour. Sleep apnea disrupts your sleep and keeps your body from getting the rest that it needs. This condition can increase your risk of certain health problems, including:  Heart attack.  Stroke.  Obesity.  Diabetes.  Heart failure.  Irregular heartbeat. There are three kinds of sleep apnea:  Obstructive sleep apnea. This kind is caused by a blocked or collapsed airway.  Central sleep apnea. This kind happens when the part of the brain that controls breathing does not send the correct signals to the muscles that control breathing.  Mixed sleep apnea. This is a combination of obstructive and central sleep apnea. What are the causes? The most common cause of this condition is a collapsed or blocked airway. An airway can collapse or become blocked if:  Your throat muscles are abnormally relaxed.  Your tongue and tonsils are larger than normal.  You are overweight.  Your airway is smaller than normal. What increases the risk? This condition is more likely to develop in people who:  Are overweight.  Smoke.  Have a smaller than normal airway.  Are elderly.  Are male.  Drink alcohol.  Take sedatives or tranquilizers.  Have a family history of sleep apnea. What are the signs or symptoms? Symptoms of this condition include:  Trouble staying asleep.  Daytime sleepiness and tiredness.  Irritability.  Loud snoring.  Morning headaches.  Trouble concentrating.  Forgetfulness.  Decreased interest in sex.  Unexplained sleepiness.  Mood swings.  Personality changes.  Feelings of depression.  Waking up often during the night to urinate.  Dry mouth.  Sore throat. How is this diagnosed? This condition may be diagnosed with:  A medical history.  A physical  exam.  A series of tests that are done while you are sleeping (sleep study). These tests are usually done in a sleep lab, but they may also be done at home. How is this treated? Treatment for this condition aims to restore normal breathing and to ease symptoms during sleep. It may involve managing health issues that can affect breathing, such as high blood pressure or obesity. Treatment may include:  Sleeping on your side.  Using a decongestant if you have nasal congestion.  Avoiding the use of depressants, including alcohol, sedatives, and narcotics.  Losing weight if you are overweight.  Making changes to your diet.  Quitting smoking.  Using a device to open your airway while you sleep, such as: ? An oral appliance. This is a custom-made mouthpiece that shifts your lower jaw forward. ? A continuous positive airway pressure (CPAP) device. This device delivers oxygen to your airway through a mask. ? A nasal expiratory positive airway pressure (EPAP) device. This device has valves that you put into each nostril. ? A bi-level positive airway pressure (BPAP) device. This device delivers oxygen to your airway through a mask.  Surgery if other treatments do not work. During surgery, excess tissue is removed to create a wider airway. It is important to get treatment for sleep apnea. Without treatment, this condition can lead to:  High blood pressure.  Coronary artery disease.  (Men) An inability to achieve or maintain an erection (impotence).  Reduced thinking abilities. Follow these instructions at home:  Make any lifestyle changes that your health care provider recommends.  Eat a healthy, well-balanced   diet.  Take over-the-counter and prescription medicines only as told by your health care provider.  Avoid using depressants, including alcohol, sedatives, and narcotics.  Take steps to lose weight if you are overweight.  If you were given a device to open your airway while you  sleep, use it only as told by your health care provider.  Do not use any tobacco products, such as cigarettes, chewing tobacco, and e-cigarettes. If you need help quitting, ask your health care provider.  Keep all follow-up visits as told by your health care provider. This is important. Contact a health care provider if:  The device that you received to open your airway during sleep is uncomfortable or does not seem to be working.  Your symptoms do not improve.  Your symptoms get worse. Get help right away if:  You develop chest pain.  You develop shortness of breath.  You develop discomfort in your back, arms, or stomach.  You have trouble speaking.  You have weakness on one side of your body.  You have drooping in your face. These symptoms may represent a serious problem that is an emergency. Do not wait to see if the symptoms will go away. Get medical help right away. Call your local emergency services (911 in the [place] Do not drive yourself to the hospital. This information is not intended to replace advice given to you by your health care provider. Make sure you discuss any questions you have with your health care provider. Document Released: 04/05/2002 Document Revised: 11/11/2016 Document Reviewed: 01/23/2015 Elsevier Interactive Patient Education  2019 [employer] Myasthenia Gravis Myasthenia gravis (MG) is a long-term (chronic) condition that causes weakness in the muscles you can control (voluntary muscles). MG can affect any voluntary muscle. The muscles most often affected are the ones that control:  Eye movement.  Facial movements.  Swallowing. MG is a disease in which the body's disease-fighting system (immune system) attacks its own healthy tissues (autoimmune disease). When you have MG, your immune system makes proteins (antibodies) that block the chemical (acetylcholine) that your body needs to send nerve signals to your muscles. This causes muscle  weakness. What are the causes? The exact cause of MG is not known. What increases the risk? The following factors may make you more likely to develop this condition:  Having an enlarged thymus gland. The thymus gland is located under the breastbone. It makes certain cells for the immune system.  Having a family history of MG. What are the signs or symptoms? Symptoms of MG may include:  Drooping eyelids.  Double vision.  Muscle weakness that gets worse with activity and gets better after rest.  Difficulty walking.  Trouble chewing and swallowing.  Trouble making facial expressions.  Slurred speech.  Weakness of the arms, hands, and legs. Sudden, severe difficulty breathing (myasthenic crisis) may develop after having:  An infection.  A fever.  A bad reaction to a medicine. Myasthenic crisis requires emergency breathing support. Sometimes symptoms of MG go away for a while (remission) and then come back later. How is this diagnosed? This condition may be diagnosed based on:  Your symptoms and medical history.  A physical exam.  Blood tests.  Tests of your muscle strength and function.  Imaging tests, such as a CT scan or an MRI. How is this treated? The goal of treatment is to improve muscle strength. Treatment may include:  Taking medicine.  Making lifestyle changes that focus on saving your energy.  Doing physical therapy to  gain strength.  Having surgery to remove the thymus gland (thymectomy). This may result in a long remission for some people.  Having a procedure to remove the acetylcholine antibodies (plasmapheresis).  Getting emergency breathing support, if you experience myasthenic crisis. If you experience remission, you may be able to stop treatment and then resume treatment when your symptoms return. Follow these instructions at home:   Take over-the-counter and prescription medicines only as told by your health care provider.  Get plenty  of rest and sleep. Take frequent breaks to rest your eyes, especially when in bright light or working on a computer.  Maintain a healthy diet and a healthy weight. Work with your health care provider or a diet and nutrition specialist (dietitian) if you need help.  Do exercises as told by your health care provider or physical therapist.  Do not use any products that contain nicotine or tobacco, such as cigarettes and e-cigarettes. If you need help quitting, ask your health care provider.  Prevent infections by: ? Washing your hands often with soap and water. If soap and water are not available, use hand sanitizer. ? Avoiding contact with other people who are sick. ? Avoiding touching your eyes, nose, and mouth. ? Cleaning surfaces in your home that are touched often using a disinfectant.  Keep all follow-up visits as told by your health care provider. This is important. Contact a health care provider if:  Your symptoms change or get worse, especially after having a fever or infection. Get help right away if:  You have trouble breathing. Summary  Myasthenia gravis (MG) is a long-term (chronic) condition that causes weakness in the muscles you can control (voluntary muscles).  A symptom of MG is muscle weakness that gets worse with activity and gets better after rest.  Sudden, severe difficulty breathing (myasthenic crisis) may develop after having an infection, a fever, or a bad reaction to a medicine.  The goal of treatment is to improve muscle strength. Treatment may include medicines, lifestyle changes, physical therapy, surgery, plasmapheresis, or emergency breathing support. This information is not intended to replace advice given to you by your health care provider. Make sure you discuss any questions you have with your health care provider. Document Released: 07/22/2000 Document Revised: 04/28/2017 Document Reviewed: 04/28/2017 Elsevier Interactive Patient Education  2019  [employer].

## 2018-06-08 NOTE — Progress Notes (Signed)
[facility]  PATIENT: Oscar Jacobs DOB: 1940-12-16  REFERRING CLINICIAN:  HISTORY FROM: patient and wife REASON FOR VISIT: follow up on CPAP compliance   HISTORICAL  CHIEF COMPLAINT:  Chief Complaint  Patient presents with  . Follow-up    pt alone, rm 10. pt states that things are working well. Has questions about So Clean machine- pt has been using the so clean machine, which is working well. The patient has a question about when to  Replace the oxygen cartridge for the so clean machine- he states that he has sinus congestion after 3-4 month again.      HISTORY OF PRESENT ILLNESS:   Oscar Jacobs is seen today after his sleep study to discuss treatment options and alternatives. This study was performed on 11-24-13.  The patient at the time had an AHI of 64.8, an RDI of 66.0 disconsolate severe sleep apnea. He didn't have any periodic limb movements and device majority of his arousals with respiratory cost. Cardiac heart rate was very irregular between 54 and 77 beats per minute with variable R. to R interval's. It appears that this patient had sinus tachycardia and some wide complex tachycardia during sleep.  Atrial fibrillation was found, a condition that he shares with his son, paternal uncle.  The patient was titrated beginning at 5 cm water pressure to 10 cm water pressure even when reaching tandem his AHI was still 8.1 which is a reduction but not optimization of CPAP therapy. The patient was placed on 10 cm water pressure this 3 cm EPR and him and nasal mask instead of a full face mask. He also has lost some weight. His BMI was actually 36.7 at the time of the study in July. It appears that the patient has very frequent , cyclic ,mostly central apneas. There was very little respiratory effort. He benefited from titration,  The diagnoses was complex apnea. He was prescribed the CPAP and has brought the machine here .  Compliance download.    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 He was seen in my sleep clinic last in 2008.   He was diagnosed with an AHI of 16 in 12-1 2012 .He has meanwhile developed myasthenia and treated by Oscar Jacobs. He hadn't used CPAP in several years , felt "well enough ", but just a couple of days ago restarted using it. He brought the machine with him today. He has a severe residual AHi of 15 and he has noted severe air leaks.I  would like for him to undergo a retest to see if apnea is still present, and if so, if treatment is indicated.  His myasthenia can certainly contribute to respiratory difficulties.  He takes antihistamines to help him sleep, and finds himself more drowsy in AM,  CPAP helped this , and he had less rhinitis while using it.  His usual bedtime is around 11 PM, and he falls asleep quickly.  He wakes once or twice for bathroom break, but usually sleeps through otherwise.His wife has reported that he is snoring again, she has also witnessed apneas which have concerned her.  The seems to be a strong positional component to snoring and apnea in this patient. He rises in the morning around 6:30 AM, he wakes up spontaneously and does not need another arm. He feels usually refreshed , has 2 coffees and has no urge to nap. If he is unstimulated , he drifts  off, especially when the TV is boring. He usually sits in a lounge chair while napping. He has at least 7 hours of sleep. Oscar Jacobs endorsed  the geriatric depression score at one point, the fatigue severity score at 37 points and the for a score at 8 points.   [place] P[place]ides were used, he was exposed to arsenic and tobacco gum.  His daughters work, one  as a Chief Executive Officer in [place], [place] , one is a [employer] education careers adviser formerly in [place], now in [place]. Daughter is a Lexicographer in [place].   His wife Oscar Jacobs is a Industrial/product designer in [place]. His mother died at age 25,  sister at 41 and wife  has become caretaker of her brother, travels frequently to [place].  Previous notes :   Interval history from 06/03/2016,  I had the pleasure of seeing Oscar Jacobs today for his yearly revisit for CPAP compliance. He has been 100% compliance for the last 30 days, with an average user time of 5 hours and 59 minutes his CPAP machine is set at 10 cm water pressure, his previous CPAP at been set at 13. He is doing excellently with a 3 cm water EPR, also known as expiratory pressure relief and a residual AHI at 2.5. He does have some air leaks but they do not affect him on most days. In short I would not have to reset the machine to any other pressures his fatigue severity scale is in normal range at 24 points, his Epworth Sleepiness Scale result is excellent at 5 points. He has frequent sinusitis, mostly affecting the right. Sinusitis will sometimes drain and "strangling " him. About a month ago, he has a period of 3 days with very dry airway. Used a Designer, television/film set in the bedroom and felt better.   Oscar Jacobs would like to obtain a second, more portable, CPAP machine. I do have model of the travel CPAP but I can show him in the sleep lab. I know that the purchase price is close $1000. However this machine is much smaller very quiet and will download dated to the smart phone so that date I can later be merged into one record for compliance by [place].  Interval history 06-08-2018, I have the pleasure of following up yearly, Dr Oscar Jacobs has placed his older CPAP in [place], [place].  He will be due for a new CPAP in 11 month- I will be happy to order then one for him.  Oscar Jacobs has 2 CPAP machines this 1 is the most commonly used and is located at his home here in [address], the patient used the machine 77% of the time 2 days out of these under 4 hours.  Average use a time on days used is 6 hours 26 minutes, the machine is set at 10 cmH2O pressure with 3 cm EPR he does have a higher than usual apnea  hypopnea residual index at 5.6, and he does have moderate to mild air leakage.  Looking at his AHI it seems that this peaked from the 19th through 21 January and that most nights his AHI is under 5 . He uses a chin strap.  I like for him to use an autotitration machine next, and keep the nasal mask.   PHYSICAL EXAM  Vitals:   06/08/18 1108  BP: (!) 142/84  Pulse: 75  Weight: 190 lb (86.2 kg)  Height: 5\' 10"  (1.778 m)   Body mass index  is 27.26 kg/m.  GENERAL EXAM: Patient is in no distress, nasal congestion.  Mild septal deviation. Sinus congestion.  Neck circumference 16.0 inches,  Mallampati 3 ,  Mild retrognathia,  no click at TMJ. No dysphagia.     CARDIOVASCULAR: irregular hear rate and rhythm, every 5th beat seems split. PVCs.   Thyroid nodules.    SKIN: no edema. Basal skin cancer left temporal face.   NEUROLOGIC: No change in smell or taste, equal pupillary size, Left eye ptosis, no diplopia, full visual fields. Normal EOM.  Normal     Attenuated DTR-   Upper and lower extremity,  Normal gait , no foot drop, no drift. No changes in penmanship.    Myasthenia's onlymanifestation ? The left eye Ptosis.   Oscar Jacobs owns a older CPAP machine that was set at 13 cm water pressure- He would be interested in having this adjusted to the current pressures so he has a second machine available as the second home.  He used a nasal mask, and chin strap.   ASSESSMENT AND PLAN - OSA well controlled on CPAP, but underlying condition of Myasthenia gravis and irregular heart beats, rhinitis and sinusitis.   He will follow up in 10 month for CPAP compliance, and by that time will be able to get a new machine. That should be an autotitration device. He likes to use a travel CPAP, but would have to purchase that without help by insurance.    Larey Seat, MD    [facility] [address], [place], [place] [address] [phone]

## 2018-06-10 DIAGNOSIS — R195 Other fecal abnormalities: Secondary | ICD-10-CM | POA: Diagnosis not present

## 2018-08-12 ENCOUNTER — Telehealth: Payer: Self-pay | Admitting: *Deleted

## 2018-08-12 NOTE — Telephone Encounter (Signed)
Called patient and informed him due to current COVID 19 pandemic, our office is severely reducing in person visits in order to minimize the risk to our patients and healthcare providers. We recommend to convert your appointment to a video visit. We'll take all precautions to reduce any security or privacy concerns. This will be treated like an office visit, and we will file with your insurance. He stated he preferred to be seen in the office but was doing well. Rescheduled for soonest in Aug and put him on wait list to be seen June if office opens to patients at that time. Patient verbalized understanding, appreciation.

## 2018-08-25 ENCOUNTER — Ambulatory Visit: Payer: Medicare Other | Admitting: Diagnostic Neuroimaging

## 2018-08-31 ENCOUNTER — Ambulatory Visit: Payer: Medicare Other | Admitting: Diagnostic Neuroimaging

## 2018-11-10 DIAGNOSIS — Z03818 Encounter for observation for suspected exposure to other biological agents ruled out: Secondary | ICD-10-CM | POA: Diagnosis not present

## 2018-12-10 ENCOUNTER — Telehealth: Payer: Self-pay | Admitting: Diagnostic Neuroimaging

## 2018-12-10 NOTE — Telephone Encounter (Signed)
Called patient and advised we can schedule a follow up with Dr Leta Baptist or his NP with a my chart virtual visit. He stated he doesn't know his password. I gave him help desk #, advised he call and get my chart active again. He will activate my chart and call back to schedule follow up. Patient verbalized understanding, appreciation.

## 2018-12-10 NOTE — Telephone Encounter (Signed)
When doing reminder calls, patient first asked to coordinate his f/u apt with Dr. Gaspar Cola with a CPAP f/u, then changed his mind and said that he wants to only do virtual apts. Best call back [phone]

## 2018-12-14 ENCOUNTER — Ambulatory Visit: Payer: Self-pay | Admitting: Diagnostic Neuroimaging

## 2019-01-07 DIAGNOSIS — Z23 Encounter for immunization: Secondary | ICD-10-CM | POA: Diagnosis not present

## 2019-02-17 ENCOUNTER — Telehealth: Payer: Self-pay | Admitting: Diagnostic Neuroimaging

## 2019-02-17 ENCOUNTER — Other Ambulatory Visit: Payer: Self-pay | Admitting: Diagnostic Neuroimaging

## 2019-02-17 DIAGNOSIS — G7000 Myasthenia gravis without (acute) exacerbation: Secondary | ICD-10-CM

## 2019-02-17 DIAGNOSIS — Z79899 Other long term (current) drug therapy: Secondary | ICD-10-CM

## 2019-02-17 NOTE — Telephone Encounter (Signed)
Pt called needing a refill on his azaTHIOprine (IMURAN) 50 MG tablet sent to the [facility] on [address]

## 2019-02-17 NOTE — Telephone Encounter (Signed)
LVM informing patient that he needs to schedule FU with Dr Leta Baptist or Leandrew Koyanagi, NP. He was last seen July 2019. Dr Leta Baptist treats his myasthenia gravis. I noted he has FU with Dr Brett Fairy for his OSA. I left number, advised we can refill Imuran when he schedules follow up.

## 2019-02-18 MED ORDER — AZATHIOPRINE 50 MG PO TABS: 150.0000 mg | ORAL_TABLET | Freq: Every day | ORAL | 1 refills | Status: DC

## 2019-02-18 NOTE — Telephone Encounter (Signed)
Patient has scheduled follow up with NP. Refilled Imuran x 6 months.

## 2019-02-18 NOTE — Telephone Encounter (Signed)
Called PCP, LVM for medical records advising her that DR Penumalli would like copies of recent CBC, CMP. Left fax and phone numbers.

## 2019-02-18 NOTE — Addendum Note (Signed)
Addended by: Minna Antis on: 02/18/2019 08:59 AM   Modules accepted: Orders

## 2019-02-18 NOTE — Telephone Encounter (Addendum)
Received call from Adonis Huguenin, [place] pharmacist, who stated she had to report to prescribing dr that there is reaction between Ramipril and Imuran of anemia, leukopenia. Pharmacist will need call back re: if provider still wants Imuran Rx filled. She is aware he has been on Imuran for a long time.  I advised her dr will be in office this afternoon, and I'll call them back. She  verbalized understanding, appreciation.

## 2019-02-18 NOTE — Telephone Encounter (Signed)
Please get updated labs (CBC, CMP) from PCP. Or redraw at our lab. Then ok to refill imuran. -VRP

## 2019-02-18 NOTE — Addendum Note (Signed)
Addended by: Minna Antis on: 02/18/2019 02:34 PM   Modules accepted: Orders

## 2019-02-18 NOTE — Telephone Encounter (Addendum)
Received copy of labs dated 05/27/2017, placed on Dr [employer]. Per Dr Leta Baptist patient needs to come in for labs. Imuran may be refilled. I called patient and advised him labs need to be drawn. He stated he has upcoming appt in our office and will get labs done then. He verbalized understanding, appreciation. CBC, CMP orders placed.

## 2019-02-23 ENCOUNTER — Other Ambulatory Visit: Payer: Self-pay | Admitting: *Deleted

## 2019-02-23 ENCOUNTER — Other Ambulatory Visit (INDEPENDENT_AMBULATORY_CARE_PROVIDER_SITE_OTHER): Payer: Self-pay

## 2019-02-23 DIAGNOSIS — Z79899 Other long term (current) drug therapy: Secondary | ICD-10-CM

## 2019-02-23 DIAGNOSIS — G7000 Myasthenia gravis without (acute) exacerbation: Secondary | ICD-10-CM

## 2019-02-23 DIAGNOSIS — Z0289 Encounter for other administrative examinations: Secondary | ICD-10-CM

## 2019-02-24 LAB — CBC WITH DIFFERENTIAL/PLATELET
Basophils Absolute: 0 10*3/uL (ref 0.0–0.2)
Basos: 1 %
EOS (ABSOLUTE): 0.1 10*3/uL (ref 0.0–0.4)
Eos: 3 %
Hematocrit: 34.4 % — ABNORMAL LOW (ref 37.5–51.0)
Hemoglobin: 11.8 g/dL — ABNORMAL LOW (ref 13.0–17.7)
Immature Grans (Abs): 0 10*3/uL (ref 0.0–0.1)
Immature Granulocytes: 0 %
Lymphocytes Absolute: 1.3 10*3/uL (ref 0.7–3.1)
Lymphs: 32 %
MCH: 37.1 pg — ABNORMAL HIGH (ref 26.6–33.0)
MCHC: 34.3 g/dL (ref 31.5–35.7)
MCV: 108 fL — ABNORMAL HIGH (ref 79–97)
Monocytes Absolute: 0.3 10*3/uL (ref 0.1–0.9)
Monocytes: 8 %
Neutrophils Absolute: 2.2 10*3/uL (ref 1.4–7.0)
Neutrophils: 56 %
Platelets: 132 10*3/uL — ABNORMAL LOW (ref 150–450)
RBC: 3.18 x10E6/uL — ABNORMAL LOW (ref 4.14–5.80)
RDW: 14.4 % (ref 11.6–15.4)
WBC: 4 10*3/uL (ref 3.4–10.8)

## 2019-02-24 LAB — COMPREHENSIVE METABOLIC PANEL
ALT: 15 IU/L (ref 0–44)
AST: 24 IU/L (ref 0–40)
Albumin/Globulin Ratio: 1.8 (ref 1.2–2.2)
Albumin: 4.5 g/dL (ref 3.7–4.7)
Alkaline Phosphatase: 60 IU/L (ref 39–117)
BUN/Creatinine Ratio: 15 (ref 10–24)
BUN: 22 mg/dL (ref 8–27)
Bilirubin Total: 0.5 mg/dL (ref 0.0–1.2)
CO2: 22 mmol/L (ref 20–29)
Calcium: 9.4 mg/dL (ref 8.6–10.2)
Chloride: 104 mmol/L (ref 96–106)
Creatinine, Ser: 1.43 mg/dL — ABNORMAL HIGH (ref 0.76–1.27)
GFR calc Af Amer: 53 mL/min/{1.73_m2} — ABNORMAL LOW (ref >59)
GFR calc non Af Amer: 46 mL/min/{1.73_m2} — ABNORMAL LOW (ref >59)
Globulin, Total: 2.5 g/dL (ref 1.5–4.5)
Glucose: 104 mg/dL — ABNORMAL HIGH (ref 65–99)
Potassium: 5.1 mmol/L (ref 3.5–5.2)
Sodium: 140 mmol/L (ref 134–144)
Total Protein: 7 g/dL (ref 6.0–8.5)

## 2019-02-24 NOTE — Progress Notes (Signed)
PATIENT: Oscar Jacobs DOB: [birth year]  REASON FOR VISIT: follow up HISTORY FROM: patient  Chief Complaint  Patient presents with  . Follow-up    8 mon f/u. Wife present. Rm 2. No new concerns at this time.      HISTORY OF PRESENT ILLNESS: Today 02/25/19 Oscar Jacobs is a 78 y.o. male here today for follow up for MG and OSA on CPAP. He is doing very well. Ptosis of left eye is stable. He notes worsening when he is tired. He is tolerating Imuran 150mg  daily and Mestinon 1/2 tablet TID. Recent labs reviewed. RBC 3.18, Hemoglobin 11.8, MCV 108, platelets 132.   He is doing well with CPAP therapy. He does note a leak and feels that this is related to mouth breathing. He uses nasal mask with chin strap but does not feel the chin strap is effective.  He also notes that there are times where he drifts off to sleep without placing CPAP therapy.  He feels that current pressure settings are appropriate.  Compliance report dated 01/25/2019 through 02/23/2019 reveals that he has used CPAP 23 of the last 30 days for compliance of 77%.  20 one of the last 30 days he used CPAP greater than 4 hours for compliance of 70%.  Average usage was 5 hours and 57 minutes.  AHI was 2.4 on 10 cm of water and an EPR of 3.  There was a significant leak noted in the 95th percentile of 27.7.  HISTORY: (copied from Dr Dohmeier's note on 06/08/2018)  Oscar Jacobs is seen today after his sleep study to discuss treatment options and alternatives. This study was performed on 11-24-13.  The patient at the time had an AHI of 64.8, an RDI of 66.0 disconsolate severe sleep apnea. He didn't have any periodic limb movements and device majority of his arousals with respiratory cost. Cardiac heart rate was very irregular between 54 and 77 beats per minute with variable R. to R interval's. It appears that this patient had sinus tachycardia and some wide complex tachycardia during sleep.  Atrial fibrillation was found, a condition that he shares  with his son, paternal uncle.  The patient was titrated beginning at 5 cm water pressure to 10 cm water pressure even when reaching tandem his AHI was still 8.1 which is a reduction but not optimization of CPAP therapy. The patient was placed on 10 cm water pressure this 3 cm EPR and him and nasal mask instead of a full face mask. He also has lost some weight. His BMI was actually 36.7 at the time of the study in July. It appears that the patient has very frequent , cyclic ,mostly central apneas. There was very little respiratory effort. He benefited from titration,  The diagnoses was complex apnea. He was prescribed the CPAP and has brought the machine here .  Compliance download.    CD: July 2015  Oscar Jacobs, a retired physician, and  a patient of Oscar Jacobs . He was seen in my sleep clinic last in 2008.   He was diagnosed with an AHI of 16 in 12-1 2012 .He has meanwhile developed myasthenia and treated by Oscar Jacobs. He hadn't used CPAP in several years , felt "well enough ", but just a couple of days ago restarted using it. He brought the machine with him today. He has a severe residual AHi of 15 and he has noted severe air leaks.I  would like for him to undergo a retest to  see if apnea is still present, and if so, if treatment is indicated.  His myasthenia can certainly contribute to respiratory difficulties.  He takes antihistamines to help him sleep, and finds himself more drowsy in AM,  CPAP helped this , and he had less rhinitis while using it.  His usual bedtime is around 11 PM, and he falls asleep quickly.  He wakes once or twice for bathroom break, but usually sleeps through otherwise.His wife has reported that he is snoring again, she has also witnessed apneas which have concerned her.  The seems to be a strong positional component to snoring and apnea in this patient. He rises in the morning around 6:30 AM, he wakes up spontaneously and does not need another arm. He feels usually  refreshed , has 2 coffees and has no urge to nap. If he is unstimulated , he drifts off, especially when the TV is boring. He usually sits in a lounge chair while napping. He has at least 7 hours of sleep. Oscar Jacobs endorsed  the geriatric depression score at one point, the fatigue severity score at 37 points and the for a score at 8 points.   [place] Pesticides were used, he was exposed to arsenic and tobacco gum.  His daughters work, one  as a Chief Executive Officer in [place], [place] , one is a [employer] Higher education careers adviser formerly in [place], now in [place]. Daughter is a Lexicographer in [place].   His wife Carrington Clamp is a Industrial/product designer in [place]. His mother died at age 69,  sister at 71 and wife has become caretaker of her brother, travels frequently to [place].  Previous notes :   Interval history from 06/03/2016,  I had the pleasure of seeing Oscar Jacobs today for his yearly revisit for CPAP compliance. He has been 100% compliance for the last 30 days, with an average user time of 5 hours and 59 minutes his CPAP machine is set at 10 cm water pressure, his previous CPAP at been set at 13. He is doing excellently with a 3 cm water EPR, also known as expiratory pressure relief and a residual AHI at 2.5. He does have some air leaks but they do not affect him on most days. In short I would not have to reset the machine to any other pressures his fatigue severity scale is in normal range at 24 points, his Epworth Sleepiness Scale result is excellent at 5 points. He has frequent sinusitis, mostly affecting the right. Sinusitis will sometimes drain and "strangling " him. About a month ago, he has a period of 3 days with very dry airway. Used a Designer, television/film set in the bedroom and felt better.   Oscar Jacobs would like to obtain a second, more portable, CPAP machine. I do have model of the travel CPAP but I can show him in the sleep lab. I know that the purchase price is close $1000. However  this machine is much smaller very quiet and will download dated to the smart phone so that date I can later be merged into one record for compliance by [place].  Interval history 06-08-2018, I have the pleasure of following up yearly, Dr [name] has placed his older CPAP in [place], [place].  He will be due for a new CPAP in 11 month- I will be happy to order then one for him.  Oscar Jacobs has 2 CPAP machines this 1 is the most commonly used and is located at his  home here in [place], the patient used the machine 77% of the time 2 days out of these under 4 hours.  Average use a time on days used is 6 hours 26 minutes, the machine is set at 10 cmH2O pressure with 3 cm EPR he does have a higher than usual apnea hypopnea residual index at 5.6, and he does have moderate to mild air leakage.  Looking at his AHI it seems that this peaked from the 19th through 21 January and that most nights his AHI is under 5 . He uses a chin strap.  I like for him to use an autotitration machine next, and keep the nasal mask.  REVIEW OF SYSTEMS: Out of a complete 14 system review of symptoms, the patient complains only of the following symptoms, none and all other reviewed systems are negative.   ALLERGIES: Allergies  Allergen Reactions  . Prednisone Other (See Comments)    manic  . Penicillins Rash  . Risperidone And Related Other (See Comments)    hyperactivity    HOME MEDICATIONS: Outpatient Medications Prior to Visit  Medication Sig Dispense Refill  . calcium-vitamin D (OSCAL WITH D) 500-200 MG-UNIT per tablet Take 1 tablet by mouth 2 (two) times daily.    . cholecalciferol (VITAMIN D) 1000 UNITS tablet Take 500 Units by mouth daily.     . Cyanocobalamin (VITAMIN B-12 CR PO) Take 1,250 mg by mouth daily.    . Multiple Vitamin (MULTIVITAMIN WITH MINERALS) TABS Take 1 tablet by mouth daily.    Marland Kitchen pyridostigmine (MESTINON) 60 MG tablet TAKE 1/2 TABLET BY MOUTH 3 TIMES A DAY 150 tablet 7  . ramipril  (ALTACE) 10 MG capsule Take 10 mg by mouth daily.    . rosuvastatin (CRESTOR) 10 MG tablet     . azaTHIOprine (IMURAN) 50 MG tablet Take 3 tablets (150 mg total) by mouth daily. 270 tablet 1   No facility-administered medications prior to visit.     PAST MEDICAL HISTORY: Past Medical History:  Diagnosis Date  . CPAP (continuous positive airway pressure) dependence   . GERD (gastroesophageal reflux disease)   . Hypertension   . Myasthenia gravis ([place])   . Sleep apnea   . Squamous carcinoma     PAST SURGICAL HISTORY: Past Surgical History:  Procedure Laterality Date  . Skin cancer removal      FAMILY HISTORY: Family History  Problem Relation Age of Onset  . Heart disease Mother   . Cancer - Other Father   . Heart disease Sister   . Heart disease Brother   . Bipolar disorder Sister   . Bipolar disorder Sister        suicide  . Healthy Brother     SOCIAL HISTORY: Social History   Socioeconomic History  . Marital status: Married    Spouse name: Eli Wisse  . Number of children: 3  . Years of education: MD  . Highest education level: Not on file  Occupational History  . Occupation: Retired    Comment: Radiology MD  Social Needs  . Financial resource strain: Not on file  . Food insecurity    Worry: Not on file    Inability: Not on file  . Transportation needs    Medical: Not on file    Non-medical: Not on file  Tobacco Use  . Smoking status: Never Smoker  . Smokeless tobacco: Never Used  Substance and Sexual Activity  . Alcohol use: Yes    Comment: seldom  .  Drug use: No  . Sexual activity: Not on file  Lifestyle  . Physical activity    Days per week: Not on file    Minutes per session: Not on file  . Stress: Not on file  Relationships  . Social Herbalist on phone: Not on file    Gets together: Not on file    Attends religious service: Not on file    Active member of club or organization: Not on file    Attends meetings of  clubs or organizations: Not on file    Relationship status: Not on file  . Intimate partner violence    Fear of current or ex partner: Not on file    Emotionally abused: Not on file    Physically abused: Not on file    Forced sexual activity: Not on file  Other Topics Concern  . Not on file  Social History Narrative   Patient is married ([name]) and lives at home with his wife.   Patient has three adult children.   Patient is retired, Stage manager.   Married.  Ambulates unassisted.   Caffeine Use- 4 cups daily.   Patient is right-handed.   Patient has a college education.      PHYSICAL EXAM  Vitals:   02/25/19 0803  BP: (!) 152/69  Pulse: (!) 58  Temp: (!) 97.2 F (36.2 C)  TempSrc: Oral  Weight: 189 lb 3.2 oz (85.8 kg)  Height: 5\' 10"  (1.778 m)   Body mass index is 27.15 kg/m.  Generalized: Well developed, in no acute distress  Cardiology: normal rate and rhythm, no murmur noted Respiratory: Clear to auscultation bilaterally Neurological examination  Mentation: Alert oriented to time, place, history taking. Follows all commands speech and language fluent Cranial nerve II-XII: Pupils were equal round reactive to light. Extraocular movements were full, visual field were full on confrontational test. Facial sensation and strength were normal. Uvula tongue midline. Head turning and shoulder shrug  were normal and symmetric. Motor: The motor testing reveals 5 over 5 strength of all 4 extremities. Good symmetric motor tone is noted throughout.  Sensory: Sensory testing is intact to soft touch on all 4 extremities. No evidence of extinction is noted.  Coordination: Cerebellar testing reveals good finger-nose-finger and heel-to-shin bilaterally.  Gait and station: Gait is normal.   DIAGNOSTIC DATA (LABS, IMAGING, TESTING) - I reviewed patient records, labs, notes, testing and imaging myself where available.  No flowsheet data found.   Lab Results  Component Value Date    WBC 4.0 02/23/2019   HGB 11.8 (L) 02/23/2019   HCT 34.4 (L) 02/23/2019   MCV 108 (H) 02/23/2019   PLT 132 (L) 02/23/2019      Component Value Date/Time   NA 140 02/23/2019 0911   K 5.1 02/23/2019 0911   CL 104 02/23/2019 0911   CO2 22 02/23/2019 0911   GLUCOSE 104 (H) 02/23/2019 0911   GLUCOSE 91 09/21/2012 0421   BUN 22 02/23/2019 0911   CREATININE 1.43 (H) 02/23/2019 0911   CALCIUM 9.4 02/23/2019 0911   PROT 7.0 02/23/2019 0911   ALBUMIN 4.5 02/23/2019 0911   AST 24 02/23/2019 0911   ALT 15 02/23/2019 0911   ALKPHOS 60 02/23/2019 0911   BILITOT 0.5 02/23/2019 0911   GFRNONAA 46 (L) 02/23/2019 0911   GFRAA 53 (L) 02/23/2019 0911   No results found for: CHOL, HDL, LDLCALC, LDLDIRECT, TRIG, CHOLHDL No results found for: HGBA1C No results found for: VITAMINB12 No  results found for: TSH     ASSESSMENT AND PLAN 78 y.o. year old male  has a past medical history of CPAP (continuous positive airway pressure) dependence, GERD (gastroesophageal reflux disease), Hypertension, Myasthenia gravis ([place]), Sleep apnea, and Squamous carcinoma. here with     ICD-10-CM   1. OSA on CPAP  G47.33 For home use only DME continuous positive airway pressure (CPAP)   Z99.89   2. Myasthenia gravis ([place])  G70.00 CBC with Differential/Platelets    Overall Tyray is doing very well.  We have discussed concerns with chinstrap.  I will place an order today to replace chinstrap/mask if needed.  He will discuss this with [facility].  Contact number has been provided today.  He was encouraged to use CPAP therapy nightly and for greater than 4 hours each night.  He is eligible for a new CPAP machine in December.  Per Dr. Edwena Felty last note, she would like an auto titrating capability.  After reviewing recent lab work, I have advised that we decrease Imuran dose.  He will decrease dose to 100 mg daily.  We will continue Mestinon 30mg  (half tablet) 3 times daily.  He will monitor closely for any worsening weakness  or other concerning symptoms.  I have advised that he return to repeat CBC in 6 to 8 weeks.  He will continue to monitor blood pressure and follow-up with primary care as directed.  He has scheduled follow-up with Dr. December.  We will discuss need for this appointment with Dr. Brett Fairy.  I anticipate seeing Milan back in the office in about 6 months.  He verbalizes understanding and agreement with this plan.   Orders Placed This Encounter  Procedures  . For home use only DME continuous positive airway pressure (CPAP)    Please refit chin strap/mask    Order Specific Question:   Length of Need    Answer:   Lifetime    Order Specific Question:   Patient has OSA or probable OSA    Answer:   Yes    Order Specific Question:   Is the patient currently using CPAP in the home    Answer:   Yes    Order Specific Question:   Settings    Answer:   Other see comments    Order Specific Question:   CPAP supplies needed    Answer:   Mask, headgear, cushions, filters, heated tubing and water chamber  . CBC with Differential/Platelets    Standing Status:   Future    Standing Expiration Date:   02/25/2020     Meds ordered this encounter  Medications  . azaTHIOprine (IMURAN) 50 MG tablet    Sig: Take 2 tablets (100 mg total) by mouth daily.    Dispense:  180 tablet    Refill:  1    Order Specific Question:   Supervising Provider    Answer:   Melvenia Beam [id number]      I spent 45 minutes with the patient. 50% of this time was spent counseling and educating patient on plan of care and medications.    Debbora Presto, FNP-C 02/25/2019, 10:39 AM [facility] [address] [place], [place] [address] [phone]

## 2019-02-25 ENCOUNTER — Encounter: Payer: Self-pay | Admitting: Family Medicine

## 2019-02-25 ENCOUNTER — Ambulatory Visit (INDEPENDENT_AMBULATORY_CARE_PROVIDER_SITE_OTHER): Payer: Medicare Other | Admitting: Family Medicine

## 2019-02-25 ENCOUNTER — Other Ambulatory Visit: Payer: Self-pay

## 2019-02-25 VITALS — BP 152/69 | HR 58 | Temp 97.2°F | Ht 70.0 in | Wt 189.2 lb

## 2019-02-25 DIAGNOSIS — G4733 Obstructive sleep apnea (adult) (pediatric): Secondary | ICD-10-CM

## 2019-02-25 DIAGNOSIS — Z9989 Dependence on other enabling machines and devices: Secondary | ICD-10-CM | POA: Diagnosis not present

## 2019-02-25 DIAGNOSIS — G7000 Myasthenia gravis without (acute) exacerbation: Secondary | ICD-10-CM | POA: Diagnosis not present

## 2019-02-25 MED ORDER — AZATHIOPRINE 50 MG PO TABS: 100.0000 mg | ORAL_TABLET | Freq: Every day | ORAL | 1 refills | Status: DC

## 2019-02-25 NOTE — Progress Notes (Signed)
[facility] received cpap orders. sy

## 2019-02-25 NOTE — Progress Notes (Signed)
I reviewed note and agree with plan.   Penni Bombard, MD 0000000, 0000000 PM Certified in Neurology, Neurophysiology and Neuroimaging  [facility] [address] [place], [place] [address] [phone]

## 2019-02-25 NOTE — Patient Instructions (Addendum)
We will decrease Imuran to 100mg  daily (2 tablets) Please have CBC rechecked in 6-8 weeks, lab orders have been placed in Epic.   Continue Mestinon 1/2 tablet three times daily  We will anticipate new CPAP orders for 03/2019. I have asked [employer] to reach out to you to discuss new chin strap/mask/headgear as discussed. Please call  [phone] if you have not heard from them in the next week or so.   We will follow up in the office pending lab results in 6-8 weeks.    Sleep Apnea Sleep apnea affects breathing during sleep. It causes breathing to stop for a short time or to become shallow. It can also increase the risk of:  Heart attack.  Stroke.  Being very overweight (obese).  Diabetes.  Heart failure.  Irregular heartbeat. The goal of treatment is to help you breathe normally again. What are the causes? There are three kinds of sleep apnea:  Obstructive sleep apnea. This is caused by a blocked or collapsed airway.  Central sleep apnea. This happens when the brain does not send the right signals to the muscles that control breathing.  Mixed sleep apnea. This is a combination of obstructive and central sleep apnea. The most common cause of this condition is a collapsed or blocked airway. This can happen if:  Your throat muscles are too relaxed.  Your tongue and tonsils are too large.  You are overweight.  Your airway is too small. What increases the risk?  Being overweight.  Smoking.  Having a small airway.  Being older.  Being male.  Drinking alcohol.  Taking medicines to calm yourself (sedatives or tranquilizers).  Having family members with the condition. What are the signs or symptoms?  Trouble staying asleep.  Being sleepy or tired during the day.  Getting angry a lot.  Loud snoring.  Headaches in the morning.  Not being able to focus your mind (concentrate).  Forgetting things.  Less interest in sex.  Mood swings.  Personality  changes.  Feelings of sadness (depression).  Waking up a lot during the night to pee (urinate).  Dry mouth.  Sore throat. How is this diagnosed?  Your medical history.  A physical exam.  A test that is done when you are sleeping (sleep study). The test is most often done in a sleep lab but may also be done at home. How is this treated?   Sleeping on your side.  Using a medicine to get rid of mucus in your nose (decongestant).  Avoiding the use of alcohol, medicines to help you relax, or certain pain medicines (narcotics).  Losing weight, if needed.  Changing your diet.  Not smoking.  Using a machine to open your airway while you sleep, such as: ? An oral appliance. This is a mouthpiece that shifts your lower jaw forward. ? A CPAP device. This device blows air through a mask when you breathe out (exhale). ? An EPAP device. This has valves that you put in each nostril. ? A BPAP device. This device blows air through a mask when you breathe in (inhale) and breathe out.  Having surgery if other treatments do not work. It is important to get treatment for sleep apnea. Without treatment, it can lead to:  High blood pressure.  Coronary artery disease.  In men, not being able to have an erection (impotence).  Reduced thinking ability. Follow these instructions at home: Lifestyle  Make changes that your doctor recommends.  Eat a healthy diet.  Lose  weight if needed.  Avoid alcohol, medicines to help you relax, and some pain medicines.  Do not use any products that contain nicotine or tobacco, such as cigarettes, e-cigarettes, and chewing tobacco. If you need help quitting, ask your doctor. General instructions  Take over-the-counter and prescription medicines only as told by your doctor.  If you were given a machine to use while you sleep, use it only as told by your doctor.  If you are having surgery, make sure to tell your doctor you have sleep apnea. You may  need to bring your device with you.  Keep all follow-up visits as told by your doctor. This is important. Contact a doctor if:  The machine that you were given to use during sleep bothers you or does not seem to be working.  You do not get better.  You get worse. Get help right away if:  Your chest hurts.  You have trouble breathing in enough air.  You have an uncomfortable feeling in your back, arms, or stomach.  You have trouble talking.  One side of your body feels weak.  A part of your face is hanging down. These symptoms may be an emergency. Do not wait to see if the symptoms will go away. Get medical help right away. Call your local emergency services (911 in the [place] Do not drive yourself to the hospital. Summary  This condition affects breathing during sleep.  The most common cause is a collapsed or blocked airway.  The goal of treatment is to help you breathe normally while you sleep. This information is not intended to replace advice given to you by your health care provider. Make sure you discuss any questions you have with your health care provider. Document Released: 01/23/2008 Document Revised: 01/30/2018 Document Reviewed: 12/09/2017 Elsevier Patient Education  2020 [employer].    Myasthenia Gravis Myasthenia gravis (MG) is a long-term (chronic) condition that causes weakness in the muscles you can control (voluntary muscles). MG can affect any voluntary muscle. The muscles most often affected are the ones that control:  Eye movement.  Facial movements.  Swallowing. MG is a disease in which the body's disease-fighting system (immune system) attacks its own healthy tissues (autoimmune disease). When you have MG, your immune system makes proteins (antibodies) that block the chemical (acetylcholine) that your body needs to send nerve signals to your muscles. This causes muscle weakness. What are the causes? The exact cause of MG is not known. What  increases the risk? The following factors may make you more likely to develop this condition:  Having an enlarged thymus gland. The thymus gland is located under the breastbone. It makes certain cells for the immune system.  Having a family history of MG. What are the signs or symptoms? Symptoms of MG may include:  Drooping eyelids.  Double vision.  Muscle weakness that gets worse with activity and gets better after rest.  Difficulty walking.  Trouble chewing and swallowing.  Trouble making facial expressions.  Slurred speech.  Weakness of the arms, hands, and legs. Sudden, severe difficulty breathing (myasthenic crisis) may develop after having:  An infection.  A fever.  A bad reaction to a medicine. Myasthenic crisis requires emergency breathing support. Sometimes symptoms of MG go away for a while (remission) and then come back later. How is this diagnosed? This condition may be diagnosed based on:  Your symptoms and medical history.  A physical exam.  Blood tests.  Tests of your muscle strength and function.  Imaging tests, such as a CT scan or an MRI. How is this treated? The goal of treatment is to improve muscle strength. Treatment may include:  Taking medicine.  Making lifestyle changes that focus on saving your energy.  Doing physical therapy to gain strength.  Having surgery to remove the thymus gland (thymectomy). This may result in a long remission for some people.  Having a procedure to remove the acetylcholine antibodies (plasmapheresis).  Getting emergency breathing support, if you experience myasthenic crisis. If you experience remission, you may be able to stop treatment and then resume treatment when your symptoms return. Follow these instructions at home:   Take over-the-counter and prescription medicines only as told by your health care provider.  Get plenty of rest and sleep. Take frequent breaks to rest your eyes, especially when  in bright light or working on a computer.  Maintain a healthy diet and a healthy weight. Work with your health care provider or a diet and nutrition specialist (dietitian) if you need help.  Do exercises as told by your health care provider or physical therapist.  Do not use any products that contain nicotine or tobacco, such as cigarettes and e-cigarettes. If you need help quitting, ask your health care provider.  Prevent infections by: ? Washing your hands often with soap and water. If soap and water are not available, use hand sanitizer. ? Avoiding contact with other people who are sick. ? Avoiding touching your eyes, nose, and mouth. ? Cleaning surfaces in your home that are touched often using a disinfectant.  Keep all follow-up visits as told by your health care provider. This is important. Contact a health care provider if:  Your symptoms change or get worse, especially after having a fever or infection. Get help right away if:  You have trouble breathing. Summary  Myasthenia gravis (MG) is a long-term (chronic) condition that causes weakness in the muscles you can control (voluntary muscles).  A symptom of MG is muscle weakness that gets worse with activity and gets better after rest.  Sudden, severe difficulty breathing (myasthenic crisis) may develop after having an infection, a fever, or a bad reaction to a medicine.  The goal of treatment is to improve muscle strength. Treatment may include medicines, lifestyle changes, physical therapy, surgery, plasmapheresis, or emergency breathing support. This information is not intended to replace advice given to you by your health care provider. Make sure you discuss any questions you have with your health care provider. Document Released: 07/22/2000 Document Revised: 04/28/2017 Document Reviewed: 04/28/2017 Elsevier Patient Education  2020 [employer].

## 2019-03-09 DIAGNOSIS — E7849 Other hyperlipidemia: Secondary | ICD-10-CM | POA: Diagnosis not present

## 2019-03-09 DIAGNOSIS — C61 Malignant neoplasm of prostate: Secondary | ICD-10-CM | POA: Diagnosis not present

## 2019-03-15 ENCOUNTER — Telehealth: Payer: Self-pay | Admitting: *Deleted

## 2019-03-15 NOTE — Telephone Encounter (Signed)
Called patient and informed him that Dr Leta Baptist wants labs repeated in 1 month due to low hgb/hct, platelets. He stated his PCP did labs last week. He will have PCP fax results for Dr Penumalli's review.  I advised after he reviews labs, I'll call him back with Dr Annell Greening recommendation. Patient verbalized understanding, appreciation.

## 2019-03-17 DIAGNOSIS — N400 Enlarged prostate without lower urinary tract symptoms: Secondary | ICD-10-CM | POA: Diagnosis not present

## 2019-03-17 DIAGNOSIS — C61 Malignant neoplasm of prostate: Secondary | ICD-10-CM | POA: Diagnosis not present

## 2019-03-29 ENCOUNTER — Telehealth: Payer: Self-pay | Admitting: Family Medicine

## 2019-03-29 NOTE — Telephone Encounter (Signed)
If all his sleep/ neurological issues were already addressed, I am happy to have his appointment rescheduled.

## 2019-03-29 NOTE — Telephone Encounter (Signed)
Please make sure that Dr Brett Fairy is ok with rescheduling appt. I did address both MG and CPAP. His blood count was a little off and we decreased Imuran dose. She may want to see him back for that appt to check in. If not, I am happy to see him back in 6 months. He does need repeat CBC in the next 2 weeks. Orders have been placed.

## 2019-03-29 NOTE — Telephone Encounter (Signed)
I called Dr. Silvestre Mesi office for most recent lab request.  LMVM for [facility] in [place].

## 2019-03-29 NOTE — Telephone Encounter (Signed)
Please make sure patient is ok with rescheduling. I will be happy to see him in April. Remind hi to have CBC updated in next two weeks. TY!

## 2019-03-29 NOTE — Telephone Encounter (Signed)
Received.  Placed on desk of AL/NP for review.

## 2019-03-29 NOTE — Telephone Encounter (Signed)
Pt is requesting a call back , he will not disclose what he needs he states he would like a call back and its not emergent

## 2019-03-29 NOTE — Telephone Encounter (Signed)
I spoke to pt.  He was asking about appt.  Looks like you addressed cpap issues and MG at your last appt.  Has appt 12/20 with Dr. Brett Fairy which I think can be cancelled (for cpap new machine correct?) and then make appt for you in 07/2019?

## 2019-04-01 NOTE — Telephone Encounter (Signed)
I called pt and he will come by and have labs drawn next Thursday. He will keep appt.  I did relay the CBC results from Dr. Joylene Draft, per amy,NP note.  He verbalized understanding.

## 2019-04-01 NOTE — Telephone Encounter (Signed)
I have reviewed his recent CBC. I would like for him to keep his appt with Dr Brett Fairy. His CBC is stable but I would like Dr Brett Fairy to review this with him and assess need to decrease MG medications.

## 2019-04-07 ENCOUNTER — Other Ambulatory Visit (INDEPENDENT_AMBULATORY_CARE_PROVIDER_SITE_OTHER): Payer: Self-pay

## 2019-04-07 DIAGNOSIS — Z0289 Encounter for other administrative examinations: Secondary | ICD-10-CM

## 2019-04-07 DIAGNOSIS — G7000 Myasthenia gravis without (acute) exacerbation: Secondary | ICD-10-CM | POA: Diagnosis not present

## 2019-04-08 LAB — CBC WITH DIFFERENTIAL/PLATELET
Basophils Absolute: 0 10*3/uL (ref 0.0–0.2)
Basos: 0 %
EOS (ABSOLUTE): 0.1 10*3/uL (ref 0.0–0.4)
Eos: 3 %
Hematocrit: 33.9 % — ABNORMAL LOW (ref 37.5–51.0)
Hemoglobin: 11.9 g/dL — ABNORMAL LOW (ref 13.0–17.7)
Immature Grans (Abs): 0 10*3/uL (ref 0.0–0.1)
Immature Granulocytes: 0 %
Lymphocytes Absolute: 1.2 10*3/uL (ref 0.7–3.1)
Lymphs: 25 %
MCH: 36 pg — ABNORMAL HIGH (ref 26.6–33.0)
MCHC: 35.1 g/dL (ref 31.5–35.7)
MCV: 102 fL — ABNORMAL HIGH (ref 79–97)
Monocytes Absolute: 0.4 10*3/uL (ref 0.1–0.9)
Monocytes: 9 %
Neutrophils Absolute: 2.9 10*3/uL (ref 1.4–7.0)
Neutrophils: 63 %
Platelets: 123 10*3/uL — ABNORMAL LOW (ref 150–450)
RBC: 3.31 x10E6/uL — ABNORMAL LOW (ref 4.14–5.80)
RDW: 13.4 % (ref 11.6–15.4)
WBC: 4.6 10*3/uL (ref 3.4–10.8)

## 2019-04-12 ENCOUNTER — Encounter: Payer: Self-pay | Admitting: Neurology

## 2019-04-12 ENCOUNTER — Other Ambulatory Visit: Payer: Self-pay

## 2019-04-12 ENCOUNTER — Ambulatory Visit (INDEPENDENT_AMBULATORY_CARE_PROVIDER_SITE_OTHER): Payer: Medicare Other | Admitting: Neurology

## 2019-04-12 VITALS — BP 137/72 | HR 61 | Temp 97.3°F | Ht 69.5 in | Wt 190.0 lb

## 2019-04-12 DIAGNOSIS — G4733 Obstructive sleep apnea (adult) (pediatric): Secondary | ICD-10-CM

## 2019-04-12 DIAGNOSIS — Z79899 Other long term (current) drug therapy: Secondary | ICD-10-CM | POA: Diagnosis not present

## 2019-04-12 DIAGNOSIS — Z9989 Dependence on other enabling machines and devices: Secondary | ICD-10-CM

## 2019-04-12 DIAGNOSIS — F1999 Other psychoactive substance use, unspecified with unspecified psychoactive substance-induced disorder: Secondary | ICD-10-CM

## 2019-04-12 DIAGNOSIS — G7000 Myasthenia gravis without (acute) exacerbation: Secondary | ICD-10-CM | POA: Diagnosis not present

## 2019-04-12 DIAGNOSIS — D6959 Other secondary thrombocytopenia: Secondary | ICD-10-CM | POA: Insufficient documentation

## 2019-04-12 DIAGNOSIS — T50905A Adverse effect of unspecified drugs, medicaments and biological substances, initial encounter: Secondary | ICD-10-CM | POA: Insufficient documentation

## 2019-04-12 NOTE — Patient Instructions (Signed)

## 2019-04-12 NOTE — Progress Notes (Addendum)
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 Follow-up    pt with wife, rm 93. pt states machine working well. DME [employer] [facility].     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 This study was performed on 11-24-13. The patient at the time had an AHI of 64.8, an RDI of 66.0 disconsolate severe sleep apnea. He didn't have any periodic limb movements and device majority of his arousals with respiratory cost. Cardiac heart rate was very irregular between 54 and 77 beats per minute with variable R. to R interval's. It appears that this patient had sinus tachycardia and some wide complex tachycardia during sleep.  Atrial fibrillation was found, a condition that he shares with his son, paternal uncle.  The patient was titrated beginning at 5 cm water pressure to 10 cm water pressure even when reaching tandem his AHI was still 8.1 which is a reduction but not optimization of CPAP therapy. The patient was placed on 10 cm water pressure this 3 cm EPR and him and nasal mask instead of a full face mask. He also has lost some weight. His BMI was actually 36.7 at the time of the study in July. It appears that the patient has very frequent , cyclic ,mostly central apneas. There was very little respiratory effort. He benefited from titration,  The diagnoses was complex apnea. He was prescribed the CPAP and has brought the machine here .  Compliance download.    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 Leta Baptist . He was seen in my sleep clinic last in 2008.   He was diagnosed with an AHI of 16 in 12-1 2012 .He has meanwhile developed myasthenia and treated by Dr. Leta Baptist. He hadn't used CPAP  in several years , felt "well enough ", but just a couple of days ago restarted using it. He brought the machine with him today. He has a severe residual AHi of 15 and he has noted severe air leaks.I  would like for him to undergo a retest to see if apnea is still present, and if so, if treatment is indicated.  His myasthenia can certainly contribute to respiratory difficulties.  He takes antihistamines to help him sleep, and finds himself more drowsy in AM,  CPAP helped this , and he had less rhinitis while using it.  His usual bedtime is around 11 PM, and he falls asleep quickly.  He wakes once or twice for bathroom break, but usually sleeps through otherwise.His wife has reported that he is snoring again, she has also witnessed apneas which have concerned her.  The seems to be a strong positional component to snoring and apnea in this patient. He rises in the morning around 6:30 AM, he wakes up spontaneously and does not need another arm. He feels usually refreshed , has 2 coffees and has no urge to nap. If he is unstimulated , he drifts off, especially when the TV is boring. He usually sits in a lounge chair while napping. He has at least 7 hours of sleep. [name] endorsed  the geriatric depression score at one point, the fatigue severity score at 37 points and the for  a score at 8 points.   [place] Pesticides were used, he was exposed to arsenic and tobacco gum.  His daughters work, one  as a Chief Executive Officer in [place], [place] and one pediatrician in [place] ( adolescent medicine )  and one is a [employer] careers adviser formerly in [place], now in [place].   His wife is a Industrial/product designer in [place]. His mother died at age 46, her sister at 75 and her mother at 55 years of age.  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 He has been 100% compliance for the last 30 days, with  an average user time of 5 hours and 59 minutes his CPAP machine is set at 10 cm water pressure, his previous CPAP at been set at 13. He is doing excellently with a 3 cm water EPR, also known as expiratory pressure relief and a residual AHI at 2.5. He does have some air leaks but they do not affect him on most days. In short I would not have to reset the machine to any other pressures his fatigue severity scale is in normal range at 24 points, his Epworth Sleepiness Scale result is excellent at 5 points. He has frequent sinusitis, mostly affecting the right. Sinusitis will sometimes drain and "strangling " him. About a month ago, he has a period of 3 days with very dry airway. Used a Designer, television/film set in the bedroom and felt better.   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 I do have model of the travel CPAP but I can show him in the sleep lab. I know that the purchase price is close $1000. However this machine is much smaller very quiet and will download dated to the smart phone so that date I can later be merged into one record for compliance by [place].   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  Patient with Myasthenia gravis and on Imuran- developed Thrombocytopenia.  Lymphoma [name] brought me his recent complete blood cell counts this was an order from 08 April 2019 which showed his platelets recovering 223,000, RBC was still low at 3.31 hemoglobin 11.9 hematocrit 33.9 but overall this is an improved result in comparison to those laboratory values from 6 months ago.  In addition we were able to review the CPAP compliance which has always been very high the patient had a 93% compliance basically missed only 2 days of CPAP use 11 November 25 November Thanksgiving-his average user time is 5 hours 34 minutes his CPAP is set at 10 cm water pressure with 3 cm EPR this is an air sense machine which is now close to 79 years old.  He is due for a  replacement soon, his residual AHI has been very good at only 2.4/h he uses a chin strap, not comfortable in combination with a nasal pillow. .    ROS: How likely are you to doze in the following situations: 0 = not likely, 1 = slight chance, 2 = moderate chance, 3 = high chance  Sitting and Reading? Watching Television? Sitting inactive in a public place (theater or meeting)? Lying down in the afternoon when circumstances permit? Sitting and talking to someone? Sitting quietly after lunch without alcohol? In a car, while stopped for a few minutes in traffic? As a passenger in a  car for an hour without a break?  Total = 6/ 24     PHYSICAL EXAM  Vitals:   04/12/19 1340  BP: 137/72  Pulse: 61  Temp: (!) 97.3 F (36.3 C)  Weight: 190 lb (86.2 kg)  Height: 5' 9.5" (1.765 m)   Body mass index is 27.66 kg/m.  GENERAL EXAM: Patient is in no distress  CARDIOVASCULAR: irregular rate and rhythm, every 5th beat seems split.    NEUROLOGIC: No change in smell or taste, equal pupillary size, nasal congestion.  Neck circumference 16.5  inches,  mallompati 4 ,  Mild retrognathia,  no click at TMJ. No dysphagia. Myasthenia manifestation ? Ptosis.   [name] owns a older CPAP machine that was set at 13 cm water pressure he would be interested in having this attached to the current pressures so he has a second machine available as the second home. His machine is now 26. 78years old .  ASSESSMENT AND PLAN - OSA well controlled on CPAP, irregular heart beats, rhinitis and sinusitis.   He will follow up in 6 month for CPAP compliance and to get a new SPLIT study for a new machine .   Larey Seat, MD    [facility] [address] [place], [place] [address] [phone]

## 2019-06-04 ENCOUNTER — Other Ambulatory Visit: Payer: Self-pay | Admitting: Diagnostic Neuroimaging

## 2019-06-04 DIAGNOSIS — Z125 Encounter for screening for malignant neoplasm of prostate: Secondary | ICD-10-CM | POA: Diagnosis not present

## 2019-06-04 DIAGNOSIS — E7849 Other hyperlipidemia: Secondary | ICD-10-CM | POA: Diagnosis not present

## 2019-06-04 DIAGNOSIS — R946 Abnormal results of thyroid function studies: Secondary | ICD-10-CM | POA: Diagnosis not present

## 2019-06-09 DIAGNOSIS — R82998 Other abnormal findings in urine: Secondary | ICD-10-CM | POA: Diagnosis not present

## 2019-06-09 DIAGNOSIS — I10 Essential (primary) hypertension: Secondary | ICD-10-CM | POA: Diagnosis not present

## 2019-06-11 DIAGNOSIS — I6529 Occlusion and stenosis of unspecified carotid artery: Secondary | ICD-10-CM | POA: Diagnosis not present

## 2019-06-11 DIAGNOSIS — C61 Malignant neoplasm of prostate: Secondary | ICD-10-CM | POA: Diagnosis not present

## 2019-06-11 DIAGNOSIS — I2510 Atherosclerotic heart disease of native coronary artery without angina pectoris: Secondary | ICD-10-CM | POA: Diagnosis not present

## 2019-06-11 DIAGNOSIS — G7000 Myasthenia gravis without (acute) exacerbation: Secondary | ICD-10-CM | POA: Diagnosis not present

## 2019-06-11 DIAGNOSIS — R918 Other nonspecific abnormal finding of lung field: Secondary | ICD-10-CM | POA: Diagnosis not present

## 2019-06-11 DIAGNOSIS — Z1331 Encounter for screening for depression: Secondary | ICD-10-CM | POA: Diagnosis not present

## 2019-06-11 DIAGNOSIS — D649 Anemia, unspecified: Secondary | ICD-10-CM | POA: Diagnosis not present

## 2019-06-11 DIAGNOSIS — D7589 Other specified diseases of blood and blood-forming organs: Secondary | ICD-10-CM | POA: Diagnosis not present

## 2019-06-11 DIAGNOSIS — R0989 Other specified symptoms and signs involving the circulatory and respiratory systems: Secondary | ICD-10-CM | POA: Diagnosis not present

## 2019-06-11 DIAGNOSIS — D696 Thrombocytopenia, unspecified: Secondary | ICD-10-CM | POA: Diagnosis not present

## 2019-06-11 DIAGNOSIS — R972 Elevated prostate specific antigen [PSA]: Secondary | ICD-10-CM | POA: Diagnosis not present

## 2019-06-11 DIAGNOSIS — E785 Hyperlipidemia, unspecified: Secondary | ICD-10-CM | POA: Diagnosis not present

## 2019-06-11 DIAGNOSIS — Z0000 Encounter for general adult medical examination without abnormal findings: Secondary | ICD-10-CM | POA: Diagnosis not present

## 2019-06-14 DIAGNOSIS — K921 Melena: Secondary | ICD-10-CM | POA: Diagnosis not present

## 2019-06-19 ENCOUNTER — Other Ambulatory Visit: Payer: Self-pay | Admitting: Internal Medicine

## 2019-06-19 DIAGNOSIS — R918 Other nonspecific abnormal finding of lung field: Secondary | ICD-10-CM

## 2019-07-02 ENCOUNTER — Ambulatory Visit
Admission: RE | Admit: 2019-07-02 | Discharge: 2019-07-02 | Disposition: A | Discharge status: Home / Self Care | Payer: Medicare Other | Source: Ambulatory Visit | Attending: Internal Medicine | Admitting: Internal Medicine

## 2019-07-02 DIAGNOSIS — R918 Other nonspecific abnormal finding of lung field: Secondary | ICD-10-CM

## 2019-08-23 ENCOUNTER — Other Ambulatory Visit: Payer: Self-pay | Admitting: *Deleted

## 2019-08-23 MED ORDER — AZATHIOPRINE 50 MG PO TABS: 100.0000 mg | ORAL_TABLET | Freq: Every day | ORAL | 1 refills | Status: DC

## 2019-08-26 DIAGNOSIS — H524 Presbyopia: Secondary | ICD-10-CM | POA: Diagnosis not present

## 2019-08-26 DIAGNOSIS — H2513 Age-related nuclear cataract, bilateral: Secondary | ICD-10-CM | POA: Diagnosis not present

## 2019-09-07 DIAGNOSIS — D485 Neoplasm of uncertain behavior of skin: Secondary | ICD-10-CM | POA: Diagnosis not present

## 2019-09-07 DIAGNOSIS — L820 Inflamed seborrheic keratosis: Secondary | ICD-10-CM | POA: Diagnosis not present

## 2019-09-07 DIAGNOSIS — D0421 Carcinoma in situ of skin of right ear and external auricular canal: Secondary | ICD-10-CM | POA: Diagnosis not present

## 2019-09-07 DIAGNOSIS — D1801 Hemangioma of skin and subcutaneous tissue: Secondary | ICD-10-CM | POA: Diagnosis not present

## 2019-09-07 DIAGNOSIS — C44722 Squamous cell carcinoma of skin of right lower limb, including hip: Secondary | ICD-10-CM | POA: Diagnosis not present

## 2019-09-07 DIAGNOSIS — Z85828 Personal history of other malignant neoplasm of skin: Secondary | ICD-10-CM | POA: Diagnosis not present

## 2019-09-07 DIAGNOSIS — L821 Other seborrheic keratosis: Secondary | ICD-10-CM | POA: Diagnosis not present

## 2019-09-07 DIAGNOSIS — L570 Actinic keratosis: Secondary | ICD-10-CM | POA: Diagnosis not present

## 2019-10-11 ENCOUNTER — Ambulatory Visit (INDEPENDENT_AMBULATORY_CARE_PROVIDER_SITE_OTHER): Payer: Medicare Other | Admitting: Diagnostic Neuroimaging

## 2019-10-11 ENCOUNTER — Encounter: Payer: Self-pay | Admitting: Diagnostic Neuroimaging

## 2019-10-11 ENCOUNTER — Ambulatory Visit: Payer: Medicare Other | Admitting: Family Medicine

## 2019-10-11 VITALS — BP 161/71 | HR 75 | Ht 69.5 in | Wt 200.4 lb

## 2019-10-11 DIAGNOSIS — G7000 Myasthenia gravis without (acute) exacerbation: Secondary | ICD-10-CM

## 2019-10-11 DIAGNOSIS — T50905A Adverse effect of unspecified drugs, medicaments and biological substances, initial encounter: Secondary | ICD-10-CM

## 2019-10-11 DIAGNOSIS — D6959 Other secondary thrombocytopenia: Secondary | ICD-10-CM | POA: Diagnosis not present

## 2019-10-11 MED ORDER — AZATHIOPRINE 50 MG PO TABS: 100.0000 mg | ORAL_TABLET | Freq: Every day | ORAL | 4 refills | Status: DC

## 2019-10-11 MED ORDER — PYRIDOSTIGMINE BROMIDE 60 MG PO TABS: 30.0000 mg | ORAL_TABLET | Freq: Three times a day (TID) | ORAL | 4 refills | Status: DC

## 2019-10-11 NOTE — Progress Notes (Signed)
[facility]  PATIENT: Oscar Jacobs DOB: 22-Sep-1940  REFERRING CLINICIAN:  HISTORY FROM: patient and wife REASON FOR VISIT: follow up   HISTORICAL  CHIEF COMPLAINT:  Chief Complaint  Patient presents with  . Myasthenia Gravis    rm 6, 6 month FU, wife- [name] "no new concerns"    HISTORY OF PRESENT ILLNESS:   UPDATE (10/11/19, VRP): Since last visit, doing well. Symptoms are stable. Imuran 100mg  daily due to low plts; now 106 in Feb 2021.    UPDATE (11/25/17, VRP): Since last visit, doing well. Symptoms are mild. Severity is mild. No alleviating or aggravating factors. Tolerating meds. Has had new PSA elevation, and biopsy (low grade --> monitoring for now).   UPDATE 12/18/16: Since last visit, doing well. Tolerating meds. Not SOB, ptosis or generalized weakness.  UPDATE 02/09/16: Since last visit, doing well. No new symptoms. Tolerating medications. Staying active with yard work.   UPDATE 08/09/15: Since last visit, doing well. Cardiology consult and workup completed and pt reassured. MG is stable. Tolerating meds.   UPDATE 02/08/15: Since last visit, doing well. No new events. Tolerating imuran and mestinon. Today has some low heart rate (not normal) and irregular rhythm. No chest pain or shortness of breath. Family hx of cardiac arrhythmia in brother and brother's son.   UPDATE 08/09/14: Since last visit, doing well. No new sxs. Stable on imuran and mestinon. No weakness or SOB.   UPDATE 02/07/14: Since last visit, MG is stable. No SOB, weakness, slurred speech or trouble swallowing. Has skin CA removed on leg (recurrent from 3 years ago). Has annual dermatology eval. Tolerating imuran 150mg  daily + mestinon 30mg  TID. Doing better with OSA and CPAP.  UPDATE 04/09/13: Continues to do well on imuran 150mg  daily. No side effects. Has been to PCP (Dr. Joylene Draft) for annual physical; PSA slightly elevated and has follow up. Patient has gained some weight (he thinks  due to inactivity). Otherwise no concerns. No SOB, weakness, numbness.  UPDATE 12/29/12: Doing well on imuran 150mg  daily. No new sxs. No SOB. No weakness. Working in the yard.  UPDATE 11/06/12: Doing well on imuran 100mg  daily. No MG flares. Left ptosis is better. No mania or mood disturbances. Last labs were improved. Taking mestinon 30mg  TID. Sometimes has diarrhea, and then takes 15mg  dose.  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 He was kindly and expertly managed by medical and neurohospitalists, and tapered off prednisone, treated with IVIG, and then started on imuran. Doing well now. Mania is almost fully resolved. Still a bit "hyper" per wife, but much improved. Platelets trended down near end of hospital stay (last 120). Here for follow up labs as well.  UPDATE 09/10/12: Since last visit, doing much better on prednisone. Now on prednisone 60mg  daily. Taking calcium + Vit D, omeprazole. No side effects, except for slightly "buzzy" mood. Sleep apnea better. Strength improved. Almost back to normal. Some left ptosis persists. Some chewing difficulty towards end of meals.  UPDATE 08/13/12: Since last visit, EMG and lab testing confirm myasthenia gravis diagnosis. I prescribed Mestinon for patient. He has had a positive response. He tried 30 mg 3 times a day, but now takes 15 mg 6 times a day, do to see increasing secretions and diarrhea. Medications lasting approximately 4 hours. No shortness of breath. Continues to have left ptosis, some neck weakness and left arm weakness. Some mild slurred speech. No trouble swallowing.  PRIOR  HPI (08/06/12): 79 year old right-handed male with history of hypertension, seasonal allergies, here for evaluation of possible myasthenia gravis. 6 weeks ago patient noted left eyelid drooping. No double vision. Over the next few weeks he had increasing weakness in keeping his head upright as well  as difficulty with talking and chewing. Since this past Saturday symptoms have been significantly worse. He denies any shortness of breath or choking problems. He has mild weakness in his arms and no weakness in his legs. No headache, confusion, chest pain. No similar symptoms in the past. Patient does have diagnosis of sleep apnea, but he has not used his CPAP machine last 2 or 3 years.   REVIEW OF SYSTEMS: Full 14 system review of systems performed and negative: diarrhea runny nose snoring apnea.    ALLERGIES: Allergies  Allergen Reactions  . Prednisone Other (See Comments)    manic  . Penicillins Rash  . Risperidone And Related Other (See Comments)    hyperactivity    HOME MEDICATIONS:  Outpatient Encounter Medications as of 10/11/2019  Medication Sig  . azaTHIOprine (IMURAN) 50 MG tablet Take 2 tablets (100 mg total) by mouth daily.  . calcium-vitamin D (OSCAL WITH D) 500-200 MG-UNIT per tablet Take 1 tablet by mouth 2 (two) times daily.  . cholecalciferol (VITAMIN D) 1000 UNITS tablet Take 500 Units by mouth daily.   . Cyanocobalamin (VITAMIN B-12 CR PO) Take 1,250 mg by mouth daily.  . Multiple Vitamin (MULTIVITAMIN WITH MINERALS) TABS Take 1 tablet by mouth daily.  Marland Kitchen pyridostigmine (MESTINON) 60 MG tablet TAKE 1/2 TABLET BY MOUTH 3 TIMES A DAY  . ramipril (ALTACE) 10 MG capsule Take 10 mg by mouth daily.  . rosuvastatin (CRESTOR) 20 MG tablet Take 20 mg by mouth daily.  . [DISCONTINUED] rosuvastatin (CRESTOR) 10 MG tablet    No facility-administered encounter medications on file as of 10/11/2019.     PAST MEDICAL HISTORY: Past Medical History:  Diagnosis Date  . CPAP (continuous positive airway pressure) dependence   . GERD (gastroesophageal reflux disease)   . Hypertension   . Myasthenia gravis ([place])   . Sleep apnea   . Squamous carcinoma     PAST SURGICAL HISTORY: Past Surgical History:  Procedure Laterality Date  . Skin cancer removal     multiple    FAMILY  HISTORY: Family History  Problem Relation Age of Onset  . Heart disease Mother   . Cancer - Other Father   . Heart disease Sister   . Heart disease Brother   . Bipolar disorder Sister   . Bipolar disorder Sister        suicide  . Healthy Brother     SOCIAL HISTORY:  Social History   Socioeconomic History  . Marital status: Married    Spouse name: Mahari Strahm  . Number of children: 3  . Years of education: MD  . Highest education level: Not on file  Occupational History  . Occupation: Retired    Comment: Radiology MD  Tobacco Use  . Smoking status: Never Smoker  . Smokeless tobacco: Never Used  Substance and Sexual Activity  . Alcohol use: Yes    Comment: seldom  . Drug use: No  . Sexual activity: Not on file  Other Topics Concern  . Not on file  Social History Narrative   Patient is married ([name]) and lives at home with his wife.   Patient has three adult children.   Patient is retired, Stage manager.   Married.  Ambulates unassisted.   Caffeine Use- 4 cups daily.   Patient is right-handed.   Patient has a college education.   Social Determinants of Health   Financial Resource Strain:   . Difficulty of Paying Living Expenses:   Food Insecurity:   . Worried About Charity fundraiser in the Last Year:   . Arboriculturist in the Last Year:   Transportation Needs:   . Film/video editor (Medical):   Marland Kitchen Lack of Transportation (Non-Medical):   Physical Activity:   . Days of Exercise per Week:   . Minutes of Exercise per Session:   Stress:   . Feeling of Stress :   Social Connections:   . Frequency of Communication with Friends and Family:   . Frequency of Social Gatherings with Friends and Family:   . Attends Religious Services:   . Active Member of Clubs or Organizations:   . Attends Archivist Meetings:   Marland Kitchen Marital Status:   Intimate Partner Violence:   . Fear of Current or Ex-Partner:   . Emotionally Abused:   Marland Kitchen Physically  Abused:   . Sexually Abused:      PHYSICAL EXAM  GENERAL EXAM/CONSTITUTIONAL: Vitals:  Vitals:   10/11/19 1615  BP: (!) 161/71  Pulse: 75  Weight: 200 lb 6.4 oz (90.9 kg)  Height: 5' 9.5" (1.765 m)   Body mass index is 29.17 kg/m. Wt Readings from Last 3 Encounters:  10/11/19 200 lb 6.4 oz (90.9 kg)  04/12/19 190 lb (86.2 kg)  02/25/19 189 lb 3.2 oz (85.8 kg)    Patient is in no distress; well developed, nourished and groomed; neck is supple  CARDIOVASCULAR:  Examination of carotid arteries is normal; no carotid bruits  Regular rate and rhythm, no murmurs  Examination of peripheral vascular system by observation and palpation is normal  EYES:  Ophthalmoscopic exam of optic discs and posterior segments is normal; no papilledema or hemorrhages No exam data present  MUSCULOSKELETAL:  Gait, strength, tone, movements noted in Neurologic exam below  NEUROLOGIC: MENTAL STATUS:  No flowsheet data found.  awake, alert, oriented to person, place and time  recent and remote memory intact  normal attention and concentration  language fluent, comprehension intact, naming intact  fund of knowledge appropriate  CRANIAL NERVE:   2nd - no papilledema on fundoscopic exam  2nd, 3rd, 4th, 6th - pupils equal and reactive to light, visual fields full to confrontation, extraocular muscles intact, no nystagmus --> EXCEPT MILD LEFT PTOSIS  5th - facial sensation symmetric  7th - facial strength symmetric  8th - hearing intact  9th - palate elevates symmetrically, uvula midline  11th - shoulder shrug symmetric  12th - tongue protrusion midline  MOTOR:   normal bulk and tone, full strength in the BUE, BLE  SENSORY:   normal and symmetric to light touch  COORDINATION:   finger-nose-finger, fine finger movements normal  REFLEXES:   deep tendon reflexes TRACE and symmetric  GAIT/STATION:   narrow based gait     DIAGNOSTIC DATA (LABS, IMAGING,  TESTING) - I reviewed patient records, labs, notes, testing and imaging myself where available.  Lab Results  Component Value Date   WBC 4.6 04/07/2019   HGB 11.9 (L) 04/07/2019   HCT 33.9 (L) 04/07/2019   MCV 102 (H) 04/07/2019   PLT 123 (L) 04/07/2019   CMP Latest Ref Rng & Units 02/23/2019 08/01/2015 08/03/2014  Glucose 65 - 99 mg/dL 104(H) 93 92  BUN  8 - 27 mg/dL 22 28(H) 22  Creatinine 0.76 - 1.27 mg/dL 1.43(H) 1.39(H) 1.31(H)  Sodium 134 - 144 mmol/L 140 141 140  Potassium 3.5 - 5.2 mmol/L 5.1 4.6 4.8  Chloride 96 - 106 mmol/L 104 101 102  CO2 20 - 29 mmol/L 22 23 22   Calcium 8.6 - 10.2 mg/dL 9.4 9.6 9.1  Total Protein 6.0 - 8.5 g/dL 7.0 6.7 6.9  Total Bilirubin 0.0 - 1.2 mg/dL 0.5 0.7 0.6  Alkaline Phos 39 - 117 IU/L 60 50 50  AST 0 - 40 IU/L 24 24 20   ALT 0 - 44 IU/L 15 13 17     08/05/12 AchR binding 33.80 (h), AchR blocking 36% (h), AchR modulating 38% (h), anti-striational (1:80 (h)  08/05/12 B12 643, TSH 4.07, FT4 1.1, BUN/Cr 21/1.7  08/06/12 CT chest - no thymoma, small nodules within the thyroid gland are likely benign, left lower lobe pulmonary nodule, Bosniak I cyst of the right kidney is partially imaged, coronary artery calcifications.  08/06/12 EMG/NCS  1. Decremental response on repetitive nerve stimulation study of greater than 30%, consistent with neuromuscular junction disorder such as myasthenia gravis. 2. No evidence of underlying myopathy or motor neuropathy. 3. Decreased motor unit recruitment of the left flexor carpi radialis muscle may indicate underlying cervical radiculopathy (C6, C7), but not confirmed on paraspinal muscle testing or nerve conduction studies.  01/21/14 CBC (WBC 4.5, Plt 207, LFTs normal)   ASSESSMENT AND PLAN  79 y.o. year old male  has a past medical history of CPAP (continuous positive airway pressure) dependence, GERD (gastroesophageal reflux disease), Hypertension, Myasthenia gravis ([place]), Sleep apnea, and Squamous carcinoma. here  with myasthenia gravis based on history and exam, EMG and lab testing. Did well with mestinon and prednisone until he developed steroid psychosis/mania. Now transitioned to imuran and doing well since July 2014.  Problem List:  Myasthenia gravis - stable  Steroid psychosis/mania - RESOLVED  Thrombocytopenia - improved  Bradycardia and irregular heart rate - RESOLVED  Myasthenia gravis (HCC)  Thrombocytopenia due to drugs    PLAN:  MYASTHENIA GRAVIS (established, stable) - stable; continue pyridostigmine 30mg  three times a day + imuran 100mg  daily - monitor labs (for imuran) q6 months  OSA on CPAP (established, stable) - stable; per Dr. Brett Fairy  Meds ordered this encounter  Medications  . azaTHIOprine (IMURAN) 50 MG tablet    Sig: Take 2 tablets (100 mg total) by mouth daily.    Dispense:  180 tablet    Refill:  4  . pyridostigmine (MESTINON) 60 MG tablet    Sig: Take 0.5 tablets (30 mg total) by mouth 3 (three) times daily.    Dispense:  135 tablet    Refill:  4   Return in about 8 months (around 06/12/2020).     Penni Bombard, MD 0/16/0109, 3:23 PM Certified in Neurology, Neurophysiology and Neuroimaging  [facility] [address] [place], [place] [address] [phone]

## 2019-10-12 ENCOUNTER — Ambulatory Visit (INDEPENDENT_AMBULATORY_CARE_PROVIDER_SITE_OTHER): Payer: Medicare Other | Admitting: Neurology

## 2019-10-12 DIAGNOSIS — G4733 Obstructive sleep apnea (adult) (pediatric): Secondary | ICD-10-CM | POA: Diagnosis not present

## 2019-10-12 DIAGNOSIS — G7000 Myasthenia gravis without (acute) exacerbation: Secondary | ICD-10-CM

## 2019-10-12 DIAGNOSIS — T50905A Adverse effect of unspecified drugs, medicaments and biological substances, initial encounter: Secondary | ICD-10-CM

## 2019-10-12 DIAGNOSIS — Z9989 Dependence on other enabling machines and devices: Secondary | ICD-10-CM

## 2019-10-12 DIAGNOSIS — G4731 Primary central sleep apnea: Secondary | ICD-10-CM

## 2019-10-12 DIAGNOSIS — F1999 Other psychoactive substance use, unspecified with unspecified psychoactive substance-induced disorder: Secondary | ICD-10-CM

## 2019-10-12 DIAGNOSIS — Z79899 Other long term (current) drug therapy: Secondary | ICD-10-CM

## 2019-10-12 DIAGNOSIS — D6959 Other secondary thrombocytopenia: Secondary | ICD-10-CM

## 2019-10-14 ENCOUNTER — Ambulatory Visit: Payer: Medicare Other | Admitting: Neurology

## 2019-10-18 ENCOUNTER — Ambulatory Visit: Payer: Medicare Other | Admitting: Family Medicine

## 2019-10-22 ENCOUNTER — Telehealth: Payer: Self-pay | Admitting: Neurology

## 2019-10-22 DIAGNOSIS — G4731 Primary central sleep apnea: Secondary | ICD-10-CM | POA: Insufficient documentation

## 2019-10-22 NOTE — Addendum Note (Signed)
Addended by: Larey Seat on: 10/22/2019 12:24 PM   Modules accepted: Orders

## 2019-10-22 NOTE — Telephone Encounter (Signed)
As of this moment, Dr Brett Fairy has not read his study. Once she has I will contact the patient. I will make aware that he has called also.

## 2019-10-22 NOTE — Telephone Encounter (Signed)
Pt called wanting to know the update on his sleep study results. Please advise.

## 2019-10-22 NOTE — Telephone Encounter (Signed)
I called pt. I advised pt that Dr. Brett Fairy reviewed their sleep study results and found that pt has severe sleep apnea. Dr. Brett Fairy recommends that pt start auto CPAP. I reviewed PAP compliance expectations with the pt. Pt is agreeable to starting a CPAP. I advised pt that an order will be sent to a DME, [facility] and [facility] will call the pt within about one week after they file with the pt's insurance. [facility]) will show the pt how to use the machine, fit for masks, and troubleshoot the CPAP if needed. A follow up appt was made for insurance purposes with Dr Brett Fairy on Sept 2,2021 at 2:30 pm. Pt verbalized understanding to arrive 15 minutes early and bring their CPAP. A letter with all of this information in it will be mailed to the pt as a reminder. I verified with the pt that the address we have on file is correct. Pt verbalized understanding of results. Pt had no questions at this time but was encouraged to call back if questions arise. I have sent the order to [facility]) and have received confirmation that they have received the order.

## 2019-10-22 NOTE — Procedures (Signed)
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 In 2015 he was retested and had an AHI of 64.8/h, CPAP residual AHI of 15/h and more while using CPAP.  He is a Myasthenia patient followed by Dr. Andrey Spearman.   The patient endorsed the Epworth Sleepiness Scale at 6/24 points   The patients weight 190 pounds with a height of 70 (inches), resulting in a BMI of 27.5 kg/m2.  CURRENT MEDICATIONS: Imuran, calcium-vitamin D, Vitamin B12, Multivitamins, Mestinon, Altace, Crestor.    PROCEDURE:  This is a multichannel digital polysomnogram utilizing the Somnostar 11.2 system.  Electrodes and sensors were applied and monitored per AASM Specifications.   EEG, EOG, Chin and Limb EMG, were sampled at 200 Hz.  ECG, Snore and Nasal Pressure, Thermal Airflow, Respiratory Effort, CPAP Flow and Pressure, Oximetry was sampled at 50 Hz. Digital video and audio were recorded.      BASELINE STUDY WITHOUT CPAP RESULTS:  Lights Out was at 20:47 and Lights On at 04:57.  Total recording time (TRT) was 201, with a total sleep time (TST) of 125 minutes.   The patients sleep latency was 4 minutes.  REM latency was 0 minutes.  The sleep efficiency was 62.2 %.   SLEEP ARCHITECTURE: WASO (Wake after sleep onset) was 70.5 minutes, Stage N1 was 8.5 minutes, Stage N2 was 116.5 minutes, Stage N3 was 0 minutes and Stage R (REM sleep) was 0 minutes.  The percentages were Stage N1 6.8%, Stage N2 93.2%, Stage N3 0% and Stage R (REM sleep) 0%.  The arousals were noted as: 29 were spontaneous, 0 were associated with PLMs, 45 were associated with respiratory events.  RESPIRATORY ANALYSIS:  There were a total of 72 respiratory events:  35  obstructive apneas, 29 central apneas and 0 mixed apneas with a total of 64 apneas and an apnea index (AI) of 30.7/h. There were 8 hypopneas .The patient also had 0 respiratory event related arousals (RERAs).  Snoring was noted.    The total APNEA/HYPOPNEA INDEX (AHI) was 34.6/h .  0 events occurred in REM sleep and 45 events in NREM. The REM AHI was 0, /hour versus a non-REM AHI of 34.6 /hour. The patient spent 328.5 minutes sleep time in the supine position 0 minutes in non-supine. The supine AHI was 34.5 /hour versus a non-supine AHI of 0.0 /hour.  OXYGEN SATURATION & C02:  The wake baseline 02 saturation was 95%, with the lowest being 90%. Time spent below 89% saturation equaled 0 minutes.  PERIODIC LIMB MOVEMENTS: The patient had a total of 0 Periodic Limb Movements EKG was irregular, see screen shot.    TITRATION STUDY WITH CPAP RESULTS: The patient was fitted with a     CPAP was initiated at 5 cmH20 with heated humidity per AASM split night standards and pressure was advanced to 12 cmH20 because of hypopneas, apneas and desaturations.  At the finally explored CPAP pressure of 12 cmH20, there was a reduction of the AHI to 8.3 /hour. The best tolerated pressure was 9 cm water pressure with an AHI of 2.1/h. the residual AHI was all REM dependent and all sleep  was supine.   Total recording time (TRT) was 289.5 minutes, with a total sleep time (TST) of 203.5 minutes. The patients sleep latency was 8 minutes. REM latency was 47.5 minutes.  The sleep efficiency was 70.3 %.    SLEEP ARCHITECTURE: Wake after sleep was 77.5 minutes, Stage N1 14 minutes, Stage N2 147.5 minutes, Stage N3 7 minutes and Stage R (REM sleep) 35 minutes. The percentages were: Stage N1 6.9%, Stage N2 72.5%, Stage N3 3.4% and Stage R (REM sleep) 17.2%. The sleep architecture was notable for REM sleep rebound.  RESPIRATORY ANALYSIS:  There were a total of 23 respiratory events: 12 obstructive apneas, 5 central apneas and 0  mixed apneas with 6 hypopneas.     The total APNEA/HYPOPNEA INDEX (AHI) was 6.8 /hour.  5 events occurred in REM sleep and 18 events in NREM. The REM AHI was 8.6 /hour versus a non-REM AHI of 6.4 /hour. The patient spent 100% of total sleep time in the supine position. The supine AHI was 6.8 /hour, versus a non-supine AHI of 0.0/hour. OXYGEN SATURATION & C02:  The wake baseline 02 saturation was 95%, with the lowest being 89%. Time spent below 89% saturation equaled 0 minutes. The arousals were noted as: 83 were spontaneous, 0 were associated with PLMs, and only 7 were associated with respiratory events. The patient had a total of 0 Periodic Limb Movements.    POLYSOMNOGRAPHY IMPRESSION :   1. Complex, and Severe Sleep Apnea with majority Obstructive Sleep Apnea- AHI was 34.6/h, apneas were almost equally divided between central and obstructive events. The most effective pressure was at 9 cm water. No EPR was used.    2. Non-specific abnormal EKG before CPAP was initiated.   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 I will ask him to use an autotitration capable CPAP machine , settings from 6 through 15 cm water with 2 cm EPR.   He used an EVORA large sized nasal mask with chin strap.    A follow up appointment will be scheduled in the Sleep Clinic at [facility].      I certify that I have reviewed the entire raw data recording prior to the issuance of this report in accordance with the Standards of Accreditation of the American Academy of Sleep Medicine (AASM)    Larey Seat, M.D. Diplomat, Tax adviser of Psychiatry and Neurology  Diplomat, Tax adviser of Sleep Medicine Market researcher, [employer] [facility] at [employer]

## 2019-10-22 NOTE — Telephone Encounter (Signed)
POLYSOMNOGRAPHY IMPRESSION :   1. Complex, and Severe Sleep Apnea with majority Obstructive Sleep Apnea- AHI was 34.6/h, apneas were almost equally divided between central and obstructive events. The most effective pressure was at 9 cm water. No EPR was used.    2. Non-specific abnormal EKG before CPAP was initiated.   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 I will ask him to use an autotitration capable CPAP machine , settings from 6 through 15 cm water with 2 cm EPR.   He used an EVORA large sized nasal mask with chin strap.

## 2019-10-22 NOTE — Telephone Encounter (Signed)
-----   Message from Larey Seat, MD sent at 10/22/2019 12:23 PM EDT ----- POLYSOMNOGRAPHY IMPRESSION :   1. Complex, and Severe Sleep Apnea with majority Obstructive  Sleep Apnea- AHI was 34.6/h, apneas were almost equally divided  between central and obstructive events. The most effective  pressure was at 9 cm water. No EPR was used.   2. Non-specific abnormal EKG before CPAP was initiated.   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 I will ask him to use an autotitration  capable CPAP machine , settings from 6 through 15 cm water with 2  cm EPR.   He used an EVORA large sized nasal mask with chin strap.    A follow up appointment will be scheduled in the Sleep Clinic at  [facility].

## 2019-10-22 NOTE — Progress Notes (Signed)
POLYSOMNOGRAPHY IMPRESSION :   1. Complex, and Severe Sleep Apnea with majority Obstructive  Sleep Apnea- AHI was 34.6/h, apneas were almost equally divided  between central and obstructive events. The most effective  pressure was at 9 cm water. No EPR was used.   2. Non-specific abnormal EKG before CPAP was initiated.   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 I will ask him to use an autotitration  capable CPAP machine , settings from 6 through 15 cm water with 2  cm EPR.   He used an EVORA large sized nasal mask with chin strap.    A follow up appointment will be scheduled in the Sleep Clinic at  [facility].

## 2019-11-04 ENCOUNTER — Telehealth: Payer: Self-pay | Admitting: Diagnostic Neuroimaging

## 2019-11-04 NOTE — Telephone Encounter (Signed)
Error

## 2019-11-04 NOTE — Telephone Encounter (Signed)
Called the patient back. Made him aware that the order was sent to the company for him on 6/25. Gave him their number so they could call and check on the status.

## 2019-11-04 NOTE — Telephone Encounter (Signed)
Pt is asking for a call re: him being in need of CPAP supplies.  Pt states he has been told he is getting a new CPAP but does not know when.  Pt is unsure if supplies still need to be ordered for his current device since it is having problems.  Please call

## 2019-12-01 DIAGNOSIS — D649 Anemia, unspecified: Secondary | ICD-10-CM | POA: Diagnosis not present

## 2019-12-01 DIAGNOSIS — G7000 Myasthenia gravis without (acute) exacerbation: Secondary | ICD-10-CM | POA: Diagnosis not present

## 2019-12-01 DIAGNOSIS — Z800 Family history of malignant neoplasm of digestive organs: Secondary | ICD-10-CM | POA: Diagnosis not present

## 2019-12-01 DIAGNOSIS — R195 Other fecal abnormalities: Secondary | ICD-10-CM | POA: Diagnosis not present

## 2019-12-14 DIAGNOSIS — Z23 Encounter for immunization: Secondary | ICD-10-CM | POA: Diagnosis not present

## 2019-12-28 ENCOUNTER — Encounter: Payer: Self-pay | Admitting: Neurology

## 2019-12-30 ENCOUNTER — Other Ambulatory Visit: Payer: Self-pay

## 2019-12-30 ENCOUNTER — Ambulatory Visit (INDEPENDENT_AMBULATORY_CARE_PROVIDER_SITE_OTHER): Payer: Medicare Other | Admitting: Neurology

## 2019-12-30 ENCOUNTER — Encounter: Payer: Self-pay | Admitting: Neurology

## 2019-12-30 VITALS — BP 146/85 | HR 85 | Ht 70.5 in | Wt 195.0 lb

## 2019-12-30 DIAGNOSIS — Z7189 Other specified counseling: Secondary | ICD-10-CM | POA: Diagnosis not present

## 2019-12-30 DIAGNOSIS — G4731 Primary central sleep apnea: Secondary | ICD-10-CM | POA: Diagnosis not present

## 2019-12-30 MED ORDER — PYRIDOSTIGMINE BROMIDE 60 MG PO TABS: 30.0000 mg | ORAL_TABLET | Freq: Three times a day (TID) | ORAL | 4 refills | Status: DC

## 2019-12-30 NOTE — Progress Notes (Signed)
[facility]  PATIENT: Oscar Rued, Oscar Jacobs  DOB: Feb 26, 1941  REFERRING CLINICIAN:  HISTORY FROM: patient and wife  REASON FOR VISIT: 12-30-2019.    follow up on CPAP compliance on his new autotitration device , needs no humidification. Uses a nasal mask.    HISTORICAL  CHIEF COMPLAINT:  Chief Complaint  Patient presents with  . Follow-up    pt with wife, rm 20. pt states machine working well. DME [employer].     HISTORY OF PRESENT ILLNESS:  Oscar Jacobs is seen today after his sleep study to discuss treatment options and alternatives. This study was performed on 11-24-13. The patient at the time had an AHI of 64.8, an RDI of 66.0 disconsolate severe sleep apnea. He didn't have any periodic limb movements and device majority of his arousals with respiratory cost. Cardiac heart rate was very irregular between 54 and 77 beats per minute with variable R. to R interval's. It appears that this patient had sinus tachycardia and some wide complex tachycardia during sleep.  Atrial fibrillation was found, a condition that he shares with his son, paternal uncle.  The patient was titrated beginning at 5 cm water pressure to 10 cm water pressure even when reaching tandem his AHI was still 8.1 which is a reduction but not optimization of CPAP therapy. The patient was placed on 10 cm water pressure this 3 cm EPR and him and nasal mask instead of a full face mask. He also has lost some weight. His BMI was actually 36.7 at the time of the study in July. It appears that the patient has very frequent , cyclic ,mostly central apneas. There was very little respiratory effort. He benefited from titration,  The diagnoses was complex apnea. He was prescribed the CPAP and has brought the machine here .  Compliance download.    CD: July 2015  Oscar Jacobs, a retired physician, and  a patient of Dr. Leta Baptist . He was seen in my sleep clinic last in 2008.   He was diagnosed with  an AHI of 16 in 12-1 2012 .He has meanwhile developed myasthenia and treated by Dr. Leta Baptist. He hadn't used CPAP in several years , felt "well enough ", but just a couple of days ago restarted using it. He brought the machine with him today. He has a severe residual AHi of 15 and he has noted severe air leaks.I  would like for him to undergo a retest to see if apnea is still present, and if so, if treatment is indicated.  His myasthenia can certainly contribute to respiratory difficulties.  He takes antihistamines to help him sleep, and finds himself more drowsy in AM,  CPAP helped this , and he had less rhinitis while using it.  His usual bedtime is around 11 PM, and he falls asleep quickly.  He wakes once or twice for bathroom break, but usually sleeps through otherwise.His wife has reported that he is snoring again, she has also witnessed apneas which have concerned her.  The seems to be a strong positional component to snoring and apnea in this patient. He rises in the morning around 6:30 AM, he wakes up spontaneously and does not need another arm. He feels usually refreshed , has 2 coffees and has no urge to nap. If he is unstimulated , he drifts off, especially when the TV is boring. He usually sits in a lounge chair while napping. He has at least 7 hours  of sleep. Oscar Jacobs endorsed  the geriatric depression score at one point, the fatigue severity score at 37 points and the for a score at 8 points.   [place] Pesticides were used, he was exposed to arsenic and tobacco gum.  His daughters work, one  as a Chief Executive Officer in [place], [place] and one pediatrician in [place] ( adolescent medicine )  and one is a [employer] careers adviser formerly in [place], now in [place].   His wife is a Industrial/product designer in [place]. His mother died at age 65, her sister at 61 and her mother at 106 years of age.  Previous notes :   Interval history from 06/03/2016,  I had the pleasure of  seeing Oscar Jacobs today for his yearly revisit for CPAP compliance. He has been 100% compliance for the last 30 days, with an average user time of 5 hours and 59 minutes his CPAP machine is set at 10 cm water pressure, his previous CPAP at been set at 13. He is doing excellently with a 3 cm water EPR, also known as expiratory pressure relief and a residual AHI at 2.5. He does have some air leaks but they do not affect him on most days. In short I would not have to reset the machine to any other pressures his fatigue severity scale is in normal range at 24 points, his Epworth Sleepiness Scale result is excellent at 5 points. He has frequent sinusitis, mostly affecting the right. Sinusitis will sometimes drain and "strangling " him. About a month ago, he has a period of 3 days with very dry airway. Used a Designer, television/film set in the bedroom and felt better.   Oscar Jacobs would like to obtain a second, more portable, CPAP machine. I do have model of the travel CPAP but I can show him in the sleep lab. I know that the purchase price is close $1000. However this machine is much smaller very quiet and will download dated to the smart phone so that date I can later be merged into one record for compliance by [place].   04-12-2019: Dr Oscar Rued, Oscar Jacobs is 79 years old.  Patient with Myasthenia gravis and on Imuran- developed Thrombocytopenia.  Lymphoma Oscar Jacobs brought me his recent complete blood cell counts this was an order from 08 April 2019 which showed his platelets recovering 223,000, RBC was still low at 3.31 hemoglobin 11.9 hematocrit 33.9 but overall this is an improved result in comparison to those laboratory values from 6 months ago.  In addition we were able to review the CPAP compliance which has always been very high the patient had a 93% compliance basically missed only 2 days of CPAP use 11 November 25 November Thanksgiving-his average user time is 5 hours 34 minutes his CPAP is set at  10 cm water pressure with 3 cm EPR this is an air sense machine which is now close to 80 years old.  He is due for a replacement soon, his residual AHI has been very good at only 2.4/h he uses a chin strap, not comfortable in combination with a nasal pillow.     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  ADD presents with his new sleep apnea machine.  He underwent a procedure to try trait to positive airway pressure his underlying complex sleep apnea this was performed on 12 October 2019.  He carries an underlying diagnosis of myasthenia gravis for which she is followed by Dr. Leta Baptist.  The patient had a baseline sleep apnea index of 34.6/h which is still considered severe he tolerated CPAP well but it was not incomplete titration so we are not certain the completion of titration by using an auto titration device machine that has a lower and upper pressure limit and can find its way this is combined with a pressure between 6 and 15 cmH2O and 2 cm EPR during his service sleep study we used the a Vohra large nasal mask with a chinstrap.  T he highest pressure explored had been 12 cmH2O but there was actually more apnea noted than on the previous tried pressures of 9 and 10 cm water.  His current download of data shows that he is 100% compliant by days and time the last night recorded if the morning of 28 December 2019 average user time is 5 hours 51 minutes so just short of 6 hours which is supplied sufficient for insurance coverage.  His residual AHI is 2.6 and this is a very good resolution of apnea with an AHI of less than 3 there are still central apneas among those residual apneas however his 95th percentile pressure has been 9.6 cmH2O and what I see today is not what I ordered for the patient.  The ultrasound machine has been here described as having a minimum pressure of 4 and maximum pressure of 20 and no EPR expiratory pressure relief this is  inconsistent with what I ordered for the patient which has been a 6-15 cmH2O was 2 cm EPR but it seems to work.    ROS: How likely are you to doze in the following situations: 0 = not likely, 1 = slight chance, 2 = moderate chance, 3 = high chance  Sitting and Reading? Watching Television? Sitting inactive in a public place (theater or meeting)? Lying down in the afternoon when circumstances permit? Sitting and talking to someone? Sitting quietly after lunch without alcohol? In a car, while stopped for a few minutes in traffic? As a passenger in a car for an hour without a break?  Total = 7/ 24   FSS 32/ 63     PHYSICAL EXAM     Vitals:   04/12/19 1340  BP: 137/72  Pulse: 61  Temp: (!) 97.3 F (36.3 C)  Weight: 190 lb (86.2 kg)  Height: 5' 9.5" (1.765 m)   Body mass index is 27.58 kg/m.  GENERAL EXAM: Patient is in no distress  CARDIOVASCULAR: irregular rate and rhythm, every 5th beat seems split.    NEUROLOGIC: No change in smell or taste, equal pupillary size, nasal congestion.  Neck circumference 16.5 inches,  mallompati 4 ,  Mild retrognathia, short airway,  no click at TMJ. No dysphagia.   Myasthenia manifestation ? Ptosis on the left , "droopy eye ".   Oscar Jacobs  Is now using an autotitration CPAP device , which has manufacturers settings.   He should have been given a setting from 6- 15 cm water with 2 cm EPR.   ASSESSMENT AND PLAN - OSA well controlled on CPAP, irregular heart beats, rhinitis and sinusitis. He has been highly compliant 100%, residual AHI 2.6/h ,mostly residual central events.   Continue myasthenia treatment with Dr Leta Baptist, Oscar Jacobs     RV 12 month for CPAP compliance with NP or me.  Larey Seat, Oscar Jacobs    [facility] [address], [phone]  [place], [place] [address] [phone]     Patient Instructions by Larey Seat, Oscar Jacobs at 04/12/2019 1:30 PM Author: Larey Seat, Oscar Jacobs Author  Type: Physician Filed: 04/12/2019 2:00 PM  Note Status: Signed Cosign: Cosign Not Required Encounter Date: 04/12/2019  Editor: Larey Seat, Oscar Jacobs (Physician)                 Sleep Apnea Sleep apnea affects breathing during sleep. It causes breathing to stop for a short time or to become shallow. It can also increase the risk of:  Heart attack.  Stroke.  Being very overweight (obese).  Diabetes.  Heart failure.  Irregular heartbeat. The goal of treatment is to help you breathe normally again. What are the causes?  There are three kinds of sleep apnea:  Obstructive sleep apnea. This is caused by a blocked or collapsed airway.  Central sleep apnea. This happens when the brain does not send the right signals to the muscles that control breathing.  Mixed sleep apnea. This is a combination of obstructive and central sleep apnea. The most common cause of this condition is a collapsed or blocked airway. This can happen if:  Your throat muscles are too relaxed.  Your tongue and tonsils are too large.  You are overweight.  Your airway is too small. What increases the risk?  Being overweight.  Smoking.  Having a small airway.  Being older.  Being male.  Drinking alcohol.  Taking medicines to calm yourself (sedatives or tranquilizers).  Having family members with the condition. What are the signs or symptoms?  Trouble staying asleep.  Being sleepy or tired during the day.  Getting angry a lot.  Loud snoring.  Headaches in the morning.  Not being able to focus your mind (concentrate).  Forgetting things.  Less interest in sex.  Mood swings.  Personality changes.  Feelings of sadness (depression).  Waking up a lot during the night to pee (urinate).  Dry mouth.  Sore throat. How is this diagnosed?  Your medical history.  A physical exam.  A test that is done when you are sleeping (sleep study). The test is most often done in a sleep lab  but may also be done at home. How is this treated?    Sleeping on your side.  Using a medicine to get rid of mucus in your nose (decongestant).  Avoiding the use of alcohol, medicines to help you relax, or certain pain medicines (narcotics).  Losing weight, if needed.  Changing your diet.  Not smoking.  Using a machine to open your airway while you sleep, such as: ? An oral appliance. This is a mouthpiece that shifts your lower jaw forward. ? A CPAP device. This device blows air through a mask when you breathe out (exhale). ? An EPAP device. This has valves that you put in each nostril. ? A BPAP device. This device blows air through a mask when you breathe in (inhale) and breathe out.  Having surgery if other treatments do not work. It is important to get treatment for sleep apnea. Without treatment, it can lead to:  High blood pressure.  Coronary artery disease.  In men, not being able to have an erection (impotence).  Reduced thinking ability. Follow these instructions at home: Lifestyle  Make changes that your doctor recommends.  Eat a healthy diet.  Lose weight if needed.  Avoid alcohol, medicines to help you relax, and some pain medicines.  Do not use any products that contain nicotine or tobacco,  such as cigarettes, e-cigarettes, and chewing tobacco. If you need help quitting, ask your doctor. General instructions  Take over-the-counter and prescription medicines only as told by your doctor.  If you were given a machine to use while you sleep, use it only as told by your doctor.  If you are having surgery, make sure to tell your doctor you have sleep apnea. You may need to bring your device with you.  Keep all follow-up visits as told by your doctor. This is important. Contact a doctor if:  The machine that you were given to use during sleep bothers you or does not seem to be working.  You do not get better.  You get worse. Get help right away  if:  Your chest hurts.  You have trouble breathing in enough air.  You have an uncomfortable feeling in your back, arms, or stomach.  You have trouble talking.  One side of your body feels weak.  A part of your face is hanging down. These symptoms may be an emergency. Do not wait to see if the symptoms will go away. Get medical help right away. Call your local emergency services (911 in the [place] Do not drive yourself to the hospital. Summary  This condition affects breathing during sleep.  The most common cause is a collapsed or blocked airway.  The goal of treatment is to help you breathe normally while you sleep. This information is not intended to replace advice given to you by your health care provider. Make sure you discuss any questions you have with your health care provider. Document Released: 01/23/2008 Document Revised: 01/30/2018 Document Reviewed: 12/09/2017 Elsevier Patient Education  2020 [place].     Instructions                PATIENT'S NAME:  Oscar Rued, Oscar Jacobs  DOB:      03-May-1940      MR#:    [id number]     DATE OF RECORDING: 10/12/2019 [facility] REFERRING M.D.:  Crist Infante, Oscar Jacobs Study Performed:  Split-Night Titration Study HISTORY:  Technologist omitted this patient's history. 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 In 2015 he was retested and had an AHI of 64.8/h, CPAP residual AHI of 15/h and more while using CPAP.  He is a Myasthenia patient followed by Dr. Andrey Spearman.   The patient endorsed the Epworth Sleepiness Scale at 6/24 points   The patient's weight 190 pounds with a height of 70 (inches), resulting in a BMI of 27.5 kg/m2.  CURRENT MEDICATIONS: Imuran, calcium-vitamin D, Vitamin B12, Multivitamins, Mestinon, Altace, Crestor.    PROCEDURE:  This is a multichannel digital polysomnogram utilizing the Somnostar 11.2 system.  Electrodes and sensors were applied and monitored per  AASM Specifications.   EEG, EOG, Chin and Limb EMG, were sampled at 200 Hz.  ECG, Snore and Nasal Pressure, Thermal Airflow, Respiratory Effort, CPAP Flow and Pressure, Oximetry was sampled at 50 Hz. Digital video and audio were recorded.      BASELINE STUDY WITHOUT CPAP RESULTS:  Lights Out was at 20:47 and Lights On at 04:57.  Total recording time (TRT) was 201, with a total sleep time (TST) of 125 minutes.   The patient's sleep latency was 4 minutes.  REM latency was 0 minutes.  The sleep efficiency was 62.2 %.   SLEEP ARCHITECTURE: WASO (Wake after sleep onset) was 70.5 minutes, Stage N1 was 8.5 minutes, Stage  N2 was 116.5 minutes, Stage N3 was 0 minutes and Stage R (REM sleep) was 0 minutes.  The percentages were Stage N1 6.8%, Stage N2 93.2%, Stage N3 0% and Stage R (REM sleep) 0%.  The arousals were noted as: 29 were spontaneous, 0 were associated with PLMs, 45 were associated with respiratory events.  RESPIRATORY ANALYSIS:  There were a total of 72 respiratory events:  35 obstructive apneas, 29 central apneas and 0 mixed apneas with a total of 64 apneas and an apnea index (AI) of 30.7/h. There were 8 hypopneas .The patient also had 0 respiratory event related arousals (RERAs).  Snoring was noted.    The total APNEA/HYPOPNEA INDEX (AHI) was 34.6/h .  0 events occurred in REM sleep and 45 events in NREM. The REM AHI was 0, /hour versus a non-REM AHI of 34.6 /hour. The patient spent 328.5 minutes sleep time in the supine position 0 minutes in non-supine. The supine AHI was 34.5 /hour versus a non-supine AHI of 0.0 /hour.  OXYGEN SATURATION & C02:  The wake baseline 02 saturation was 95%, with the lowest being 90%. Time spent below 89% saturation equaled 0 minutes.  PERIODIC LIMB MOVEMENTS: The patient had a total of 0 Periodic Limb Movements EKG was irregular, see screen shot.    TITRATION STUDY WITH CPAP RESULTS: The patient was fitted with a EVORA large Iand chin strap.     CPAP was  initiated at 5 cmH20 with heated humidity per AASM split night standards and pressure was advanced to 12 cmH20 because of hypopneas, apneas and desaturations.  At the finally explored CPAP pressure of 12 cmH20, there was a reduction of the AHI to 8.3 /hour. The best tolerated pressure was 9 cm water pressure with an AHI of 2.1/h. the residual AHI was all REM dependent and all sleep was supine.    POLYSOMNOGRAPHY IMPRESSION :   1. Complex, and Severe Sleep Apnea with majority Obstructive Sleep Apnea- AHI was 34.6/h, apneas were almost equally divided between central and obstructive events. The most effective pressure was at 9 cm water. No EPR was used.    2. Non-specific abnormal EKG before CPAP was initiated.   RECOMMENDATIONS: Oscar Jacobs has not been fully titrated and the therapy is incomplete. I will ask him to use an autotitration capable CPAP machine , settings from 6 through 15 cm water with 2 cm EPR.   He used an EVORA large sized nasal mask with chin strap.    A follow up appointment will be scheduled in the Sleep Clinic at [facility].      I certify that I have reviewed the entire raw data recording prior to the issuance of this report in accordance with the Standards of Accreditation of the American Academy of Sleep Medicine (AASM)    Larey Seat, M.D. Diplomat, Tax adviser of Psychiatry and Neurology  Diplomat, Tax adviser of Sleep Medicine Market researcher, [employer] [facility] at [employer]

## 2020-01-29 DIAGNOSIS — Z23 Encounter for immunization: Secondary | ICD-10-CM | POA: Diagnosis not present

## 2020-02-10 DIAGNOSIS — D696 Thrombocytopenia, unspecified: Secondary | ICD-10-CM | POA: Diagnosis not present

## 2020-02-10 DIAGNOSIS — I10 Essential (primary) hypertension: Secondary | ICD-10-CM | POA: Diagnosis not present

## 2020-02-15 DIAGNOSIS — K648 Other hemorrhoids: Secondary | ICD-10-CM | POA: Diagnosis not present

## 2020-02-15 DIAGNOSIS — R195 Other fecal abnormalities: Secondary | ICD-10-CM | POA: Diagnosis not present

## 2020-02-15 DIAGNOSIS — K5730 Diverticulosis of large intestine without perforation or abscess without bleeding: Secondary | ICD-10-CM | POA: Diagnosis not present

## 2020-02-15 DIAGNOSIS — D649 Anemia, unspecified: Secondary | ICD-10-CM | POA: Diagnosis not present

## 2020-03-09 DIAGNOSIS — Z85828 Personal history of other malignant neoplasm of skin: Secondary | ICD-10-CM | POA: Diagnosis not present

## 2020-03-09 DIAGNOSIS — L570 Actinic keratosis: Secondary | ICD-10-CM | POA: Diagnosis not present

## 2020-03-09 DIAGNOSIS — D1801 Hemangioma of skin and subcutaneous tissue: Secondary | ICD-10-CM | POA: Diagnosis not present

## 2020-03-09 DIAGNOSIS — L820 Inflamed seborrheic keratosis: Secondary | ICD-10-CM | POA: Diagnosis not present

## 2020-03-09 DIAGNOSIS — D0421 Carcinoma in situ of skin of right ear and external auricular canal: Secondary | ICD-10-CM | POA: Diagnosis not present

## 2020-03-09 DIAGNOSIS — L821 Other seborrheic keratosis: Secondary | ICD-10-CM | POA: Diagnosis not present

## 2020-03-15 DIAGNOSIS — R972 Elevated prostate specific antigen [PSA]: Secondary | ICD-10-CM | POA: Diagnosis not present

## 2020-03-15 DIAGNOSIS — N400 Enlarged prostate without lower urinary tract symptoms: Secondary | ICD-10-CM | POA: Diagnosis not present

## 2020-03-15 DIAGNOSIS — C61 Malignant neoplasm of prostate: Secondary | ICD-10-CM | POA: Diagnosis not present

## 2020-05-17 DIAGNOSIS — Z23 Encounter for immunization: Secondary | ICD-10-CM | POA: Diagnosis not present

## 2020-06-11 ENCOUNTER — Other Ambulatory Visit: Payer: Self-pay | Admitting: Diagnostic Neuroimaging

## 2020-06-12 ENCOUNTER — Encounter: Payer: Self-pay | Admitting: Diagnostic Neuroimaging

## 2020-06-12 ENCOUNTER — Ambulatory Visit (INDEPENDENT_AMBULATORY_CARE_PROVIDER_SITE_OTHER): Payer: Medicare Other | Admitting: Diagnostic Neuroimaging

## 2020-06-12 VITALS — BP 139/78 | HR 85 | Ht 70.0 in | Wt 201.0 lb

## 2020-06-12 DIAGNOSIS — G7000 Myasthenia gravis without (acute) exacerbation: Secondary | ICD-10-CM | POA: Diagnosis not present

## 2020-06-12 DIAGNOSIS — E785 Hyperlipidemia, unspecified: Secondary | ICD-10-CM | POA: Diagnosis not present

## 2020-06-12 DIAGNOSIS — Z125 Encounter for screening for malignant neoplasm of prostate: Secondary | ICD-10-CM | POA: Diagnosis not present

## 2020-06-12 MED ORDER — PYRIDOSTIGMINE BROMIDE 60 MG PO TABS: 30.0000 mg | ORAL_TABLET | Freq: Three times a day (TID) | ORAL | 4 refills | Status: DC

## 2020-06-12 MED ORDER — AZATHIOPRINE 50 MG PO TABS: 100.0000 mg | ORAL_TABLET | Freq: Every day | ORAL | 4 refills | Status: DC

## 2020-06-12 NOTE — Progress Notes (Signed)
[facility]  PATIENT: Oscar Jacobs DOB: [birth year]  REFERRING CLINICIAN:  HISTORY FROM: patient and wife REASON FOR VISIT: follow up   HISTORICAL  CHIEF COMPLAINT:  Chief Complaint  Patient presents with  . Myasthenia Gravis    Rm 6,  8 month FU wife- [name]  "no new concerns"    HISTORY OF PRESENT ILLNESS:   UPDATE (06/12/20, VRP): Since last visit, doing well. Symptoms are stable from MG standpoint. No alleviating or aggravating factors. Tolerating mestinon and imuran.    UPDATE (10/11/19, VRP): Since last visit, doing well. Symptoms are stable. Imuran 100mg  daily due to low plts; now 106 in Feb 2021.    UPDATE (11/25/17, VRP): Since last visit, doing well. Symptoms are mild. Severity is mild. No alleviating or aggravating factors. Tolerating meds. Has had new PSA elevation, and biopsy (low grade --> monitoring for now).   UPDATE 12/18/16: Since last visit, doing well. Tolerating meds. Not SOB, ptosis or generalized weakness.  UPDATE 02/09/16: Since last visit, doing well. No new symptoms. Tolerating medications. Staying active with yard work.   UPDATE 08/09/15: Since last visit, doing well. Cardiology consult and workup completed and pt reassured. MG is stable. Tolerating meds.   UPDATE 02/08/15: Since last visit, doing well. No new events. Tolerating imuran and mestinon. Today has some low heart rate (not normal) and irregular rhythm. No chest pain or shortness of breath. Family hx of cardiac arrhythmia in brother and brother's son.   UPDATE 08/09/14: Since last visit, doing well. No new sxs. Stable on imuran and mestinon. No weakness or SOB.   UPDATE 02/07/14: Since last visit, MG is stable. No SOB, weakness, slurred speech or trouble swallowing. Has skin CA removed on leg (recurrent from 3 years ago). Has annual dermatology eval. Tolerating imuran 150mg  daily + mestinon 30mg  TID. Doing better with OSA and CPAP.  UPDATE 04/09/13: Continues to do well on  imuran 150mg  daily. No side effects. Has been to PCP (Dr. Joylene Draft) for annual physical; PSA slightly elevated and has follow up. Patient has gained some weight (he thinks due to inactivity). Otherwise no concerns. No SOB, weakness, numbness.  UPDATE 12/29/12: Doing well on imuran 150mg  daily. No new sxs. No SOB. No weakness. Working in the yard.  UPDATE 11/06/12: Doing well on imuran 100mg  daily. No MG flares. Left ptosis is better. No mania or mood disturbances. Last labs were improved. Taking mestinon 30mg  TID. Sometimes has diarrhea, and then takes 15mg  dose.  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 He was kindly and expertly managed by medical and neurohospitalists, and tapered off prednisone, treated with IVIG, and then started on imuran. Doing well now. Mania is almost fully resolved. Still a bit "hyper" per wife, but much improved. Platelets trended down near end of hospital stay (last 120). Here for follow up labs as well.  UPDATE 09/10/12: Since last visit, doing much better on prednisone. Now on prednisone 60mg  daily. Taking calcium + Vit D, omeprazole. No side effects, except for slightly "buzzy" mood. Sleep apnea better. Strength improved. Almost back to normal. Some left ptosis persists. Some chewing difficulty towards end of meals.  UPDATE 08/13/12: Since last visit, EMG and lab testing confirm myasthenia gravis diagnosis. I prescribed Mestinon for patient. He has had a positive response. He tried 30 mg 3 times a day, but now takes 15 mg 6 times a day, do to see increasing secretions and diarrhea. Medications lasting  approximately 4 hours. No shortness of breath. Continues to have left ptosis, some neck weakness and left arm weakness. Some mild slurred speech. No trouble swallowing.  PRIOR HPI (08/06/12): 80 year old right-handed male with history of hypertension, seasonal allergies, here for evaluation of possible  myasthenia gravis. 6 weeks ago patient noted left eyelid drooping. No double vision. Over the next few weeks he had increasing weakness in keeping his head upright as well as difficulty with talking and chewing. Since this past Saturday symptoms have been significantly worse. He denies any shortness of breath or choking problems. He has mild weakness in his arms and no weakness in his legs. No headache, confusion, chest pain. No similar symptoms in the past. Patient does have diagnosis of sleep apnea, but he has not used his CPAP machine last 2 or 3 years.   REVIEW OF SYSTEMS: Full 14 system review of systems performed and negative: as per HPI.    ALLERGIES: Allergies  Allergen Reactions  . Prednisone Other (See Comments)    manic  . Penicillins Rash  . Risperidone And Related Other (See Comments)    hyperactivity    HOME MEDICATIONS:  Outpatient Encounter Medications as of 06/12/2020  Medication Sig  . azaTHIOprine (IMURAN) 50 MG tablet Take 2 tablets (100 mg total) by mouth daily.  . calcium-vitamin D (OSCAL WITH D) 500-200 MG-UNIT per tablet Take 1 tablet by mouth 2 (two) times daily.  . cholecalciferol (VITAMIN D) 1000 UNITS tablet Take 500 Units by mouth daily.   . Cyanocobalamin (VITAMIN B-12 CR PO) Take 1,250 mg by mouth daily.  . Multiple Vitamin (MULTIVITAMIN WITH MINERALS) TABS Take 1 tablet by mouth daily.  Marland Kitchen pyridostigmine (MESTINON) 60 MG tablet Take 0.5 tablets (30 mg total) by mouth 3 (three) times daily.  . ramipril (ALTACE) 10 MG capsule Take 10 mg by mouth daily.  . rosuvastatin (CRESTOR) 20 MG tablet Take 20 mg by mouth daily.   No facility-administered encounter medications on file as of 06/12/2020.     PAST MEDICAL HISTORY: Past Medical History:  Diagnosis Date  . CPAP (continuous positive airway pressure) dependence   . GERD (gastroesophageal reflux disease)   . Hypertension   . Myasthenia gravis ([place])   . Sleep apnea   . Squamous carcinoma     PAST  SURGICAL HISTORY: Past Surgical History:  Procedure Laterality Date  . COLONOSCOPY    . Skin cancer removal     multiple    FAMILY HISTORY: Family History  Problem Relation Age of Onset  . Heart disease Mother   . Cancer - Other Father   . Heart disease Sister   . Heart disease Brother   . Bipolar disorder Sister   . Bipolar disorder Sister        suicide  . Healthy Brother     SOCIAL HISTORY:  Social History   Socioeconomic History  . Marital status: Married    Spouse name: Brentin Shin  . Number of children: 3  . Years of education: MD  . Highest education level: Not on file  Occupational History  . Occupation: Retired    Comment: Radiology MD  Tobacco Use  . Smoking status: Never Smoker  . Smokeless tobacco: Never Used  Substance and Sexual Activity  . Alcohol use: Yes    Comment: seldom  . Drug use: No  . Sexual activity: Not on file  Other Topics Concern  . Not on file  Social History Narrative   Patient is married (  [name]) and lives at home with his wife.   Patient has three adult children.   Patient is retired, Stage manager.   Married.  Ambulates unassisted.   Caffeine Use- 4 cups daily.   Patient is right-handed.   Patient has a college education.   Social Determinants of Health   Financial Resource Strain: Not on file  Food Insecurity: Not on file  Transportation Needs: Not on file  Physical Activity: Not on file  Stress: Not on file  Social Connections: Not on file  Intimate Partner Violence: Not on file     PHYSICAL EXAM  GENERAL EXAM/CONSTITUTIONAL: Vitals:  Vitals:   06/12/20 1500  BP: 139/78  Pulse: 85  Weight: 201 lb (91.2 kg)  Height: 5\' 10"  (1.778 m)   Body mass index is 28.84 kg/m. Wt Readings from Last 3 Encounters:  06/12/20 201 lb (91.2 kg)  12/30/19 195 lb (88.5 kg)  10/11/19 200 lb 6.4 oz (90.9 kg)    Patient is in no distress; well developed, nourished and groomed; neck is  supple  CARDIOVASCULAR:  Examination of carotid arteries is normal; no carotid bruits  Regular rate and rhythm, no murmurs  Examination of peripheral vascular system by observation and palpation is normal  EYES:  Ophthalmoscopic exam of optic discs and posterior segments is normal; no papilledema or hemorrhages No exam data present  MUSCULOSKELETAL:  Gait, strength, tone, movements noted in Neurologic exam below  NEUROLOGIC: MENTAL STATUS:  No flowsheet data found.  awake, alert, oriented to person, place and time  recent and remote memory intact  normal attention and concentration  language fluent, comprehension intact, naming intact  fund of knowledge appropriate  CRANIAL NERVE:   2nd - no papilledema on fundoscopic exam  2nd, 3rd, 4th, 6th - pupils equal and reactive to light, visual fields full to confrontation, extraocular muscles intact, no nystagmus --> EXCEPT MILD LEFT PTOSIS  5th - facial sensation symmetric  7th - facial strength symmetric  8th - hearing intact  9th - palate elevates symmetrically, uvula midline  11th - shoulder shrug symmetric  12th - tongue protrusion midline  MOTOR:   normal bulk and tone, full strength in the BUE, BLE  SENSORY:   normal and symmetric to light touch  COORDINATION:   finger-nose-finger, fine finger movements normal  REFLEXES:   deep tendon reflexes TRACE and symmetric  GAIT/STATION:   narrow based gait     DIAGNOSTIC DATA (LABS, IMAGING, TESTING) - I reviewed patient records, labs, notes, testing and imaging myself where available.  Lab Results  Component Value Date   WBC 4.6 04/07/2019   HGB 11.9 (L) 04/07/2019   HCT 33.9 (L) 04/07/2019   MCV 102 (H) 04/07/2019   PLT 123 (L) 04/07/2019   CMP Latest Ref Rng & Units 02/23/2019 08/01/2015 08/03/2014  Glucose 65 - 99 mg/dL 104(H) 93 92  BUN 8 - 27 mg/dL 22 28(H) 22  Creatinine 0.76 - 1.27 mg/dL 1.43(H) 1.39(H) 1.31(H)  Sodium 134 - 144  mmol/L 140 141 140  Potassium 3.5 - 5.2 mmol/L 5.1 4.6 4.8  Chloride 96 - 106 mmol/L 104 101 102  CO2 20 - 29 mmol/L 22 23 22   Calcium 8.6 - 10.2 mg/dL 9.4 9.6 9.1  Total Protein 6.0 - 8.5 g/dL 7.0 6.7 6.9  Total Bilirubin 0.0 - 1.2 mg/dL 0.5 0.7 0.6  Alkaline Phos 39 - 117 IU/L 60 50 50  AST 0 - 40 IU/L 24 24 20   ALT 0 - 44 IU/L 15  13 17    08/05/12 AchR binding 33.80 (h), AchR blocking 36% (h), AchR modulating 38% (h), anti-striational (1:80 (h)  08/05/12 B12 643, TSH 4.07, FT4 1.1, BUN/Cr 21/1.7  08/06/12 CT chest - no thymoma, small nodules within the thyroid gland are likely benign, left lower lobe pulmonary nodule, Bosniak I cyst of the right kidney is partially imaged, coronary artery calcifications.  08/06/12 EMG/NCS  1. Decremental response on repetitive nerve stimulation study of greater than 30%, consistent with neuromuscular junction disorder such as myasthenia gravis. 2. No evidence of underlying myopathy or motor neuropathy. 3. Decreased motor unit recruitment of the left flexor carpi radialis muscle may indicate underlying cervical radiculopathy (C6, C7), but not confirmed on paraspinal muscle testing or nerve conduction studies.  01/21/14 CBC (WBC 4.5, Plt 207, LFTs normal)   ASSESSMENT AND PLAN  80 y.o. year old male  has a past medical history of CPAP (continuous positive airway pressure) dependence, GERD (gastroesophageal reflux disease), Hypertension, Myasthenia gravis ([place]), Sleep apnea, and Squamous carcinoma. here with myasthenia gravis based on history and exam, EMG and lab testing. Did well with mestinon and prednisone until he developed steroid psychosis/mania. Now transitioned to imuran and doing well since July 2014.  Problem List:  Myasthenia gravis - stable  Steroid psychosis/mania - RESOLVED  Thrombocytopenia - improved  Bradycardia and irregular heart rate - RESOLVED  Myasthenia gravis ([facility])    PLAN:  MYASTHENIA GRAVIS (established, stable) -  stable; continue pyridostigmine 30mg  three times a day + imuran 100mg  daily - monitor labs (for imuran) q6 months  OSA on CPAP (established, stable) - stable; per Dr. Brett Fairy  Meds ordered this encounter  Medications  . pyridostigmine (MESTINON) 60 MG tablet    Sig: Take 0.5 tablets (30 mg total) by mouth 3 (three) times daily.    Dispense:  135 tablet    Refill:  4  . azaTHIOprine (IMURAN) 50 MG tablet    Sig: Take 2 tablets (100 mg total) by mouth daily.    Dispense:  180 tablet    Refill:  4   Return in about 8 months (around 02/09/2021).     Penni Bombard, MD 12/21/537, 7:67 PM Certified in Neurology, Neurophysiology and Neuroimaging  [facility] [address] [place], [place] [address] [phone]

## 2020-06-19 DIAGNOSIS — Z0000 Encounter for general adult medical examination without abnormal findings: Secondary | ICD-10-CM | POA: Diagnosis not present

## 2020-06-19 DIAGNOSIS — R82998 Other abnormal findings in urine: Secondary | ICD-10-CM | POA: Diagnosis not present

## 2020-06-19 DIAGNOSIS — G4730 Sleep apnea, unspecified: Secondary | ICD-10-CM | POA: Diagnosis not present

## 2020-06-19 DIAGNOSIS — D696 Thrombocytopenia, unspecified: Secondary | ICD-10-CM | POA: Diagnosis not present

## 2020-06-19 DIAGNOSIS — C61 Malignant neoplasm of prostate: Secondary | ICD-10-CM | POA: Diagnosis not present

## 2020-06-19 DIAGNOSIS — H02409 Unspecified ptosis of unspecified eyelid: Secondary | ICD-10-CM | POA: Diagnosis not present

## 2020-06-19 DIAGNOSIS — R918 Other nonspecific abnormal finding of lung field: Secondary | ICD-10-CM | POA: Diagnosis not present

## 2020-06-19 DIAGNOSIS — I6529 Occlusion and stenosis of unspecified carotid artery: Secondary | ICD-10-CM | POA: Diagnosis not present

## 2020-06-19 DIAGNOSIS — M8589 Other specified disorders of bone density and structure, multiple sites: Secondary | ICD-10-CM | POA: Diagnosis not present

## 2020-06-19 DIAGNOSIS — E785 Hyperlipidemia, unspecified: Secondary | ICD-10-CM | POA: Diagnosis not present

## 2020-06-19 DIAGNOSIS — I2510 Atherosclerotic heart disease of native coronary artery without angina pectoris: Secondary | ICD-10-CM | POA: Diagnosis not present

## 2020-06-19 DIAGNOSIS — R0989 Other specified symptoms and signs involving the circulatory and respiratory systems: Secondary | ICD-10-CM | POA: Diagnosis not present

## 2020-06-19 DIAGNOSIS — I10 Essential (primary) hypertension: Secondary | ICD-10-CM | POA: Diagnosis not present

## 2020-09-04 DIAGNOSIS — H2513 Age-related nuclear cataract, bilateral: Secondary | ICD-10-CM | POA: Diagnosis not present

## 2020-09-29 DIAGNOSIS — Z85828 Personal history of other malignant neoplasm of skin: Secondary | ICD-10-CM | POA: Diagnosis not present

## 2020-09-29 DIAGNOSIS — L821 Other seborrheic keratosis: Secondary | ICD-10-CM | POA: Diagnosis not present

## 2020-09-29 DIAGNOSIS — D1801 Hemangioma of skin and subcutaneous tissue: Secondary | ICD-10-CM | POA: Diagnosis not present

## 2020-09-29 DIAGNOSIS — L309 Dermatitis, unspecified: Secondary | ICD-10-CM | POA: Diagnosis not present

## 2020-09-29 DIAGNOSIS — L570 Actinic keratosis: Secondary | ICD-10-CM | POA: Diagnosis not present

## 2020-12-19 DIAGNOSIS — E785 Hyperlipidemia, unspecified: Secondary | ICD-10-CM | POA: Diagnosis not present

## 2020-12-19 DIAGNOSIS — Z79899 Other long term (current) drug therapy: Secondary | ICD-10-CM | POA: Diagnosis not present

## 2020-12-19 DIAGNOSIS — Z125 Encounter for screening for malignant neoplasm of prostate: Secondary | ICD-10-CM | POA: Diagnosis not present

## 2021-01-02 DIAGNOSIS — R972 Elevated prostate specific antigen [PSA]: Secondary | ICD-10-CM | POA: Diagnosis not present

## 2021-01-03 ENCOUNTER — Ambulatory Visit: Payer: BLUE CROSS/BLUE SHIELD | Admitting: Neurology

## 2021-01-04 ENCOUNTER — Ambulatory Visit (INDEPENDENT_AMBULATORY_CARE_PROVIDER_SITE_OTHER): Payer: Medicare Other | Admitting: Neurology

## 2021-01-04 ENCOUNTER — Encounter: Payer: Self-pay | Admitting: Neurology

## 2021-01-04 VITALS — BP 134/68 | HR 66 | Ht 70.0 in | Wt 197.0 lb

## 2021-01-04 DIAGNOSIS — G4733 Obstructive sleep apnea (adult) (pediatric): Secondary | ICD-10-CM | POA: Diagnosis not present

## 2021-01-04 DIAGNOSIS — Z9989 Dependence on other enabling machines and devices: Secondary | ICD-10-CM | POA: Diagnosis not present

## 2021-01-04 DIAGNOSIS — G7000 Myasthenia gravis without (acute) exacerbation: Secondary | ICD-10-CM | POA: Diagnosis not present

## 2021-01-04 NOTE — Progress Notes (Signed)
CM sent to [facility] 

## 2021-01-04 NOTE — Progress Notes (Signed)
[facility]   PATIENT: Oscar Rued, Oscar Jacobs  DOB: [birth year]   REFERRING CLINICIAN:  HISTORY FROM: patient and wife   REASON FOR VISIT: 12-30-2019.    follow up on CPAP compliance on his new autotitration device , needs no humidification. Uses a nasal mask.  He has lots of air leaks which may contribute to his irritaion at the right eye.       HISTORICAL   CHIEF COMPLAINT:      Chief Complaint  Patient presents with   Follow-up      pt with wife, rm 30. pt states machine working well. DME [employer].       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 He has sleep apnea and myasthenia;   08/05/12 AchR binding 33.80 (h), AchR blocking 36% (h), AchR modulating 38% (h), anti-striational (1:80 (h)  08/05/12 B12 643, TSH 4.07, FT4 1.1, BUN/Cr 21/1.7  08/06/12 CT chest - no thymoma, small nodules within the thyroid gland are likely benign, left lower lobe pulmonary nodule, Bosniak I cyst of the right kidney is partially imaged, coronary artery calcifications.  08/06/12 EMG/NCS  1. Decremental response on repetitive nerve stimulation study of greater than 30%, consistent with neuromuscular junction disorder such as myasthenia gravis. 2. No evidence of underlying myopathy or motor neuropathy. 3. Decreased motor unit recruitment of the left flexor carpi radialis muscle may indicate underlying cervical radiculopathy (C6, C7), but not confirmed on paraspinal muscle testing or nerve conduction studies.  01/21/14 CBC (WBC 4.5, Plt 207, LFTs normal)   ASSESSMENT AND PLAN  80 y.o. year old male  has a past medical history of CPAP (continuous positive airway pressure) dependence, GERD (gastroesophageal reflux disease), Hypertension, Myasthenia gravis ([place]), Sleep apnea, and Squamous carcinoma. here with myasthenia gravis based on history and exam, EMG and lab testing. Did well with mestinon  and prednisone until he developed steroid psychosis/mania. Now transitioned to imuran and doing well since July 2014.  Problem List: Myasthenia gravis - stable Steroid psychosis/mania - RESOLVED Thrombocytopenia - improved Bradycardia and irregular heart rate - RESOLved  Complex sleep apnea , compliant on CPAP.      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  Patient with Myasthenia gravis and on Imuran- developed Thrombocytopenia.  Lymphoma [name] brought me his recent complete blood cell counts this was an order from 08 April 2019 which showed his platelets recovering 223,000, RBC was still low at 3.31 hemoglobin 11.9 hematocrit 33.9 but overall this is an improved result in comparison to those laboratory values from 6 months ago.  In addition we were able to review the CPAP compliance which has always been very high the patient had a 93% compliance basically missed only 2 days of CPAP use 11 November 25 November Thanksgiving-his average user time is 5 hours 34 minutes his CPAP is set at 10 cm water pressure with 3 cm EPR this is an air sense machine which is now close to 81 years old.  He is due for a replacement soon, his residual AHI has been very good at only 2.4/h he uses a chin strap, not comfortable in combination with a nasal pillow.  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.  ADD presents with his new sleep apnea machine.  He underwent a procedure to try trait to positive airway pressure his underlying complex sleep apnea this was performed on 12 October 2019.  He carries an underlying diagnosis of myasthenia gravis for which she is followed by Dr. Leta Baptist.  The patient had a baseline sleep apnea index of 34.6/h which is still considered  severe he tolerated CPAP well but it was not incomplete titration so we are not certain the completion of titration by using an auto titration device machine that has a lower and upper pressure limit and can find its way this is combined with a pressure between 6 and 15 cmH2O and 2 cm EPR during his service sleep study we used the a Vohra large nasal mask with a chinstrap.  T he highest pressure explored had been 12 cmH2O but there was actually more apnea noted than on the previous tried pressures of 9 and 10 cm water.  His current download of data shows that he is 100% compliant by days and time the last night recorded if the morning of 28 December 2019 average user time is 5 hours 51 minutes so just short of 6 hours which is supplied sufficient for insurance coverage.  His residual AHI is 2.6 and this is a very good resolution of apnea with an AHI of less than 3 there are still central apneas among those residual apneas however his 95th percentile pressure has been 9.6 cmH2O and what I see today is not what I ordered for the patient.  The ultrasound machine has been here described as having a minimum pressure of 4 and maximum pressure of 20 and no EPR expiratory pressure relief this is inconsistent with what I ordered for the patient which has been a 6-15 cmH2O was 2 cm EPR but it seems to work.      ROS: How likely are you to doze in the following situations: 0 = not likely, 1 = slight chance, 2 = moderate chance, 3 = high chance   Sitting and Reading? Watching Television? Sitting inactive in a public place (theater or meeting)? Lying down in the afternoon when circumstances permit? Sitting and talking to someone? Sitting quietly after lunch without alcohol? In a car, while stopped for a few minutes in traffic? As a passenger in a car for an hour without a break?   Total =6/ 24   FSS 23/ 63 points. He feels better, he sleeps better, has a red eye , tearing .        PHYSICAL EXAM       Vitals:    04/12/19 1340  BP: 137/72  Pulse: 61  Temp: (!) 97.3 F (36.3 C)  Weight: 190 lb (86.2 kg)  Height: 5' 9.5" (1.765 m)    Body mass index is 28.27 kg/m.   GENERAL EXAM: Patient is in no distress   CARDIOVASCULAR: irregular rate and rhythm, every 5th beat seems split.  No edema No rash      NEUROLOGIC: No change in smell or taste, equal pupillary size,  small at baseline, no constriction.  Chronic nasal congestion.  Neck circumference 16.5 inches,  mallompati 4 ,  Mild retrognathia, short airway,  no click at TMJ. No dysphagia.  Facial sensation and motor function intact.  Good grip strength.  Slightly stooped gait but stability, and not stooped.  Myasthenia manifestation ? Ptosis on the left , "droopy eye ". Right eye s red to the corner, tearing.     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   He should have been given a setting from 6- 15 cm water with 2 cm EPR.   Today's auto titration CPAP download shows 100% compliance for days and 97% compliance 4hours the average is 6 hours and 45 minutes.  The machine is set with a minimum pressure of 6 and a maximum pressure of 15 cmH2O without EPR his residual AHI is 3.0 there are some obstructive apneas left and only 0.3 centrals the 95th percentile pressure is 12 cmH2O and the leaks are at the 95th percentile of 34 L/min.  No Cheyne-Stokes respirations were seen so I am happy to leave the patient here.   He does have air leakage and I hope it does not irritate his eye !     ASSESSMENT AND PLAN - OSA well controlled on CPAP, irregular heart beats, rhinitis and sinusitis. Continue myasthenia treatment with Dr Leta Baptist, Oscar Jacobs     RV 12 month for CPAP compliance with NP or me.   Larey Seat, Oscar Jacobs     [facility] [address], [place], [place] [address] [phone]            Instructions                PATIENT'S  NAME:  Oscar Jacobs, Riegel, Oscar Jacobs  DOB:      [birth year]      MR#:    [id number]     DATE OF RECORDING: 10/12/2019 [facility] REFERRING M.D.:  Crist Infante, Oscar Jacobs Study Performed:  Split-Night Titration Study HISTORY:  Technologist omitted this patient's history. 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 In 2015 he was retested and had an AHI of 64.8/h, CPAP residual AHI of 15/h and more while using CPAP.  He is a Myasthenia patient followed by Dr. Andrey Spearman.   The patient endorsed the Epworth Sleepiness Scale at 6/24 points   The patient's weight 190 pounds with a height of 70 (inches), resulting in a BMI of 27.5 kg/m2.  CURRENT MEDICATIONS: Imuran, calcium-vitamin D, Vitamin B12, Multivitamins, Mestinon, Altace, Crestor.    PROCEDURE:  This is a multichannel digital polysomnogram utilizing the Somnostar 11.2 system.  Electrodes and sensors were applied and monitored per AASM Specifications.   EEG, EOG, Chin and Limb EMG, were sampled at 200 Hz.  ECG, Snore and Nasal Pressure, Thermal Airflow, Respiratory Effort, CPAP Flow and Pressure, Oximetry was sampled at 50 Hz. Digital video and audio were recorded.      BASELINE STUDY WITHOUT CPAP RESULTS:  Lights Out was at 20:47 and Lights On at 04:57.  Total recording time (TRT) was 201, with a total sleep time (TST) of 125 minutes.   The patient's sleep latency was 4 minutes.  REM latency was 0 minutes.  The sleep efficiency was 62.2 %.   SLEEP ARCHITECTURE: WASO (Wake after sleep onset) was 70.5 minutes, Stage N1 was 8.5 minutes, Stage N2 was 116.5 minutes, Stage N3 was 0 minutes and Stage R (REM sleep) was 0 minutes.  The percentages were Stage N1 6.8%, Stage N2 93.2%, Stage N3 0% and Stage R (REM sleep) 0%.  The arousals were noted as: 29 were spontaneous, 0 were associated with PLMs, 45 were associated with respiratory  events.  RESPIRATORY ANALYSIS:  There were a total of 72 respiratory events:  35 obstructive  apneas, 29 central apneas and 0 mixed apneas with a total of 64 apneas and an apnea index (AI) of 30.7/h. There were 8 hypopneas .The patient also had 0 respiratory event related arousals (RERAs).  Snoring was noted.    The total APNEA/HYPOPNEA INDEX (AHI) was 34.6/h .  0 events occurred in REM sleep and 45 events in NREM. The REM AHI was 0, /hour versus a non-REM AHI of 34.6 /hour. The patient spent 328.5 minutes sleep time in the supine position 0 minutes in non-supine. The supine AHI was 34.5 /hour versus a non-supine AHI of 0.0 /hour.  OXYGEN SATURATION & C02:  The wake baseline 02 saturation was 95%, with the lowest being 90%. Time spent below 89% saturation equaled 0 minutes.  PERIODIC LIMB MOVEMENTS: The patient had a total of 0 Periodic Limb Movements EKG was irregular, see screen shot.    TITRATION STUDY WITH CPAP RESULTS: The patient was fitted with a EVORA large Iand chin strap.     CPAP was initiated at 5 cmH20 with heated humidity per AASM split night standards and pressure was advanced to 12 cmH20 because of hypopneas, apneas and desaturations.  At the finally explored CPAP pressure of 12 cmH20, there was a reduction of the AHI to 8.3 /hour. The best tolerated pressure was 9 cm water pressure with an AHI of 2.1/h. the residual AHI was all REM dependent and all sleep was supine.    POLYSOMNOGRAPHY IMPRESSION :   Complex, and Severe Sleep Apnea with majority Obstructive Sleep Apnea- AHI was 34.6/h, apneas were almost equally divided between central and obstructive events. The most effective pressure was at 9 cm water. No EPR was used.    Non-specific abnormal EKG before CPAP was initiated.   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 I will ask him to use an autotitration capable CPAP machine , settings from 6 through 15 cm water with 2 cm EPR.   He used an EVORA large sized nasal mask with chin strap.    A follow up appointment will be  scheduled in the Sleep Clinic at [facility].      I certify that I have reviewed the entire raw data recording prior to the issuance of this report in accordance with the Standards of Accreditation of the American Academy of Sleep Medicine (AASM)    Larey Seat, M.D. Diplomat, Tax adviser of Psychiatry and Neurology  Diplomat, Tax adviser of Sleep Medicine Market researcher, [employer] [facility] at [employer]

## 2021-01-05 DIAGNOSIS — H1131 Conjunctival hemorrhage, right eye: Secondary | ICD-10-CM | POA: Diagnosis not present

## 2021-01-08 DIAGNOSIS — Z23 Encounter for immunization: Secondary | ICD-10-CM | POA: Diagnosis not present

## 2021-01-11 DIAGNOSIS — C61 Malignant neoplasm of prostate: Secondary | ICD-10-CM | POA: Diagnosis not present

## 2021-01-11 DIAGNOSIS — N400 Enlarged prostate without lower urinary tract symptoms: Secondary | ICD-10-CM | POA: Diagnosis not present

## 2021-02-03 DIAGNOSIS — Z23 Encounter for immunization: Secondary | ICD-10-CM | POA: Diagnosis not present

## 2021-02-12 ENCOUNTER — Ambulatory Visit (INDEPENDENT_AMBULATORY_CARE_PROVIDER_SITE_OTHER): Payer: Medicare Other | Admitting: Diagnostic Neuroimaging

## 2021-02-12 ENCOUNTER — Encounter: Payer: Self-pay | Admitting: Diagnostic Neuroimaging

## 2021-02-12 VITALS — BP 142/74 | HR 79 | Ht 70.0 in | Wt 202.4 lb

## 2021-02-12 DIAGNOSIS — Z9989 Dependence on other enabling machines and devices: Secondary | ICD-10-CM

## 2021-02-12 DIAGNOSIS — G7000 Myasthenia gravis without (acute) exacerbation: Secondary | ICD-10-CM

## 2021-02-12 DIAGNOSIS — G4733 Obstructive sleep apnea (adult) (pediatric): Secondary | ICD-10-CM

## 2021-02-12 MED ORDER — AZATHIOPRINE 50 MG PO TABS: 100.0000 mg | ORAL_TABLET | Freq: Every day | ORAL | 4 refills | Status: DC

## 2021-02-12 MED ORDER — PYRIDOSTIGMINE BROMIDE 60 MG PO TABS: 30.0000 mg | ORAL_TABLET | Freq: Three times a day (TID) | ORAL | 4 refills | Status: DC

## 2021-02-12 NOTE — Progress Notes (Signed)
[facility]  PATIENT: Oscar Jacobs DOB: 08-09-1940  REFERRING CLINICIAN:  HISTORY FROM: patient and wife REASON FOR VISIT: follow up   HISTORICAL  CHIEF COMPLAINT:  Chief Complaint  Patient presents with   Myasthenia gravis    Rm 7,  8 month FU  wife- Oscar Jacobs  "no new concerns"    HISTORY OF PRESENT ILLNESS:   UPDATE (02/12/21, VRP): Since last visit, doing well. Symptoms are stable. Tolerating mestinon and imuran. Labs stable.   UPDATE (06/12/20, VRP): Since last visit, doing well. Symptoms are stable from MG standpoint. No alleviating or aggravating factors. Tolerating mestinon and imuran.    UPDATE (10/11/19, VRP): Since last visit, doing well. Symptoms are stable. Imuran 100mg  daily due to low plts; now 106 in Feb 2021.    UPDATE (11/25/17, VRP): Since last visit, doing well. Symptoms are mild. Severity is mild. No alleviating or aggravating factors. Tolerating meds. Has had new PSA elevation, and biopsy (low grade --> monitoring for now).   UPDATE 12/18/16: Since last visit, doing well. Tolerating meds. Not SOB, ptosis or generalized weakness.  UPDATE 02/09/16: Since last visit, doing well. No new symptoms. Tolerating medications. Staying active with yard work.   UPDATE 08/09/15: Since last visit, doing well. Cardiology consult and workup completed and pt reassured. MG is stable. Tolerating meds.   UPDATE 02/08/15: Since last visit, doing well. No new events. Tolerating imuran and mestinon. Today has some low heart rate (not normal) and irregular rhythm. No chest pain or shortness of breath. Family hx of cardiac arrhythmia in brother and brother's son.   UPDATE 08/09/14: Since last visit, doing well. No new sxs. Stable on imuran and mestinon. No weakness or SOB.   UPDATE 02/07/14: Since last visit, MG is stable. No SOB, weakness, slurred speech or trouble swallowing. Has skin CA removed on leg (recurrent from 3 years ago). Has annual dermatology eval.  Tolerating imuran 150mg  daily + mestinon 30mg  TID. Doing better with OSA and CPAP.  UPDATE 04/09/13: Continues to do well on imuran 150mg  daily. No side effects. Has been to PCP (Dr. Joylene Draft) for annual physical; PSA slightly elevated and has follow up. Patient has gained some weight (he thinks due to inactivity). Otherwise no concerns. No SOB, weakness, numbness.  UPDATE 12/29/12: Doing well on imuran 150mg  daily. No new sxs. No SOB. No weakness. Working in the yard.  UPDATE 11/06/12: Doing well on imuran 100mg  daily. No MG flares. Left ptosis is better. No mania or mood disturbances. Last labs were improved. Taking mestinon 30mg  TID. Sometimes has diarrhea, and then takes 15mg  dose.  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 He was kindly and expertly managed by medical and neurohospitalists, and tapered off prednisone, treated with IVIG, and then started on imuran. Doing well now. Mania is almost fully resolved. Still a bit "hyper" per wife, but much improved. Platelets trended down near end of hospital stay (last 120). Here for follow up labs as well.  UPDATE 09/10/12: Since last visit, doing much better on prednisone. Now on prednisone 60mg  daily. Taking calcium + Vit D, omeprazole. No side effects, except for slightly "buzzy" mood. Sleep apnea better. Strength improved. Almost back to normal. Some left ptosis persists. Some chewing difficulty towards end of meals.  UPDATE 08/13/12: Since last visit, EMG and lab testing confirm myasthenia gravis diagnosis. I prescribed Mestinon for patient. He has had a positive response. He tried 30 mg 3 times  a day, but now takes 15 mg 6 times a day, do to see increasing secretions and diarrhea. Medications lasting approximately 4 hours. No shortness of breath. Continues to have left ptosis, some neck weakness and left arm weakness. Some mild slurred speech. No trouble swallowing.  PRIOR  HPI (08/06/12): 80 year old right-handed male with history of hypertension, seasonal allergies, here for evaluation of possible myasthenia gravis. 6 weeks ago patient noted left eyelid drooping. No double vision. Over the next few weeks he had increasing weakness in keeping his head upright as well as difficulty with talking and chewing. Since this past Saturday symptoms have been significantly worse. He denies any shortness of breath or choking problems. He has mild weakness in his arms and no weakness in his legs. No headache, confusion, chest pain. No similar symptoms in the past. Patient does have diagnosis of sleep apnea, but he has not used his CPAP machine last 2 or 3 years.   REVIEW OF SYSTEMS: Full 14 system review of systems performed and negative: as per HPI.    ALLERGIES: Allergies  Allergen Reactions   Prednisone Other (See Comments)    manic   Penicillins Rash   Risperidone And Related Other (See Comments)    hyperactivity    HOME MEDICATIONS:  Outpatient Encounter Medications as of 02/12/2021  Medication Sig   calcium-vitamin D (OSCAL WITH D) 500-200 MG-UNIT per tablet Take 1 tablet by mouth 2 (two) times daily.   cholecalciferol (VITAMIN D) 1000 UNITS tablet Take 500 Units by mouth daily.    Cyanocobalamin (VITAMIN B-12 CR PO) Take 1,250 mg by mouth daily.   Multiple Vitamin (MULTIVITAMIN WITH MINERALS) TABS Take 1 tablet by mouth daily.   ramipril (ALTACE) 10 MG capsule Take 10 mg by mouth daily.   rosuvastatin (CRESTOR) 20 MG tablet Take 20 mg by mouth daily.   [DISCONTINUED] azaTHIOprine (IMURAN) 50 MG tablet Take 2 tablets (100 mg total) by mouth daily.   [DISCONTINUED] pyridostigmine (MESTINON) 60 MG tablet Take 0.5 tablets (30 mg total) by mouth 3 (three) times daily.   azaTHIOprine (IMURAN) 50 MG tablet Take 2 tablets (100 mg total) by mouth daily.   pyridostigmine (MESTINON) 60 MG tablet Take 0.5 tablets (30 mg total) by mouth 3 (three) times daily.   No  facility-administered encounter medications on file as of 02/12/2021.     PAST MEDICAL HISTORY: Past Medical History:  Diagnosis Date   CPAP (continuous positive airway pressure) dependence    GERD (gastroesophageal reflux disease)    Hypertension    Myasthenia gravis ([place])    Sleep apnea    Squamous carcinoma     PAST SURGICAL HISTORY: Past Surgical History:  Procedure Laterality Date   COLONOSCOPY     Skin cancer removal     multiple    FAMILY HISTORY: Family History  Problem Relation Age of Onset   Heart disease Mother    Cancer - Other Father    Heart disease Sister    Heart disease Brother    Bipolar disorder Sister    Bipolar disorder Sister        suicide   Healthy Brother     SOCIAL HISTORY:  Social History   Socioeconomic History   Marital status: Married    Spouse name: Joesiah Lonon   Number of children: 3   Years of education: MD   Highest education level: Not on file  Occupational History   Occupation: Retired    Comment: Radiology MD  Tobacco Use  Smoking status: Never   Smokeless tobacco: Never  Substance and Sexual Activity   Alcohol use: Yes    Comment: seldom   Drug use: No   Sexual activity: Not on file  Other Topics Concern   Not on file  Social History Narrative   Patient is married (Oscar Jacobs) and lives at home with his wife.   Patient has three adult children.   Patient is retired, Stage manager.   Married.  Ambulates unassisted.   Caffeine Use- 4 cups daily.   Patient is right-handed.   Patient has a college education.   Social Determinants of Health   Financial Resource Strain: Not on file  Food Insecurity: Not on file  Transportation Needs: Not on file  Physical Activity: Not on file  Stress: Not on file  Social Connections: Not on file  Intimate Partner Violence: Not on file     PHYSICAL EXAM  GENERAL EXAM/CONSTITUTIONAL: Vitals:  Vitals:   02/12/21 1439  BP: (!) 142/74  Pulse: 79  Weight: 202 lb  6.4 oz (91.8 kg)  Height: 5\' 10"  (1.778 m)   Body mass index is 29.04 kg/m. Wt Readings from Last 3 Encounters:  02/12/21 202 lb 6.4 oz (91.8 kg)  01/04/21 197 lb (89.4 kg)  06/12/20 201 lb (91.2 kg)   Patient is in no distress; well developed, nourished and groomed; neck is supple  CARDIOVASCULAR: Examination of carotid arteries is normal; no carotid bruits Regular rate and rhythm, no murmurs Examination of peripheral vascular system by observation and palpation is normal  EYES: Ophthalmoscopic exam of optic discs and posterior segments is normal; no papilledema or hemorrhages No results found.  MUSCULOSKELETAL: Gait, strength, tone, movements noted in Neurologic exam below  NEUROLOGIC: MENTAL STATUS:  No flowsheet data found. awake, alert, oriented to person, place and time recent and remote memory intact normal attention and concentration language fluent, comprehension intact, naming intact fund of knowledge appropriate  CRANIAL NERVE:  2nd - no papilledema on fundoscopic exam 2nd, 3rd, 4th, 6th - pupils equal and reactive to light, visual fields full to confrontation, extraocular muscles intact, no nystagmus --> EXCEPT MILD LEFT PTOSIS 5th - facial sensation symmetric 7th - facial strength symmetric 8th - hearing intact 9th - palate elevates symmetrically, uvula midline 11th - shoulder shrug symmetric 12th - tongue protrusion midline  MOTOR:  normal bulk and tone, full strength in the BUE, BLE  SENSORY:  normal and symmetric to light touch  COORDINATION:  finger-nose-finger, fine finger movements normal  REFLEXES:  deep tendon reflexes TRACE and symmetric  GAIT/STATION:  narrow based gait     DIAGNOSTIC DATA (LABS, IMAGING, TESTING) - I reviewed patient records, labs, notes, testing and imaging myself where available.  Lab Results  Component Value Date   WBC 4.6 04/07/2019   HGB 11.9 (L) 04/07/2019   HCT 33.9 (L) 04/07/2019   MCV 102 (H)  04/07/2019   PLT 123 (L) 04/07/2019   CMP Latest Ref Rng & Units 02/23/2019 08/01/2015 08/03/2014  Glucose 65 - 99 mg/dL 104(H) 93 92  BUN 8 - 27 mg/dL 22 28(H) 22  Creatinine 0.76 - 1.27 mg/dL 1.43(H) 1.39(H) 1.31(H)  Sodium 134 - 144 mmol/L 140 141 140  Potassium 3.5 - 5.2 mmol/L 5.1 4.6 4.8  Chloride 96 - 106 mmol/L 104 101 102  CO2 20 - 29 mmol/L 22 23 22   Calcium 8.6 - 10.2 mg/dL 9.4 9.6 9.1  Total Protein 6.0 - 8.5 g/dL 7.0 6.7 6.9  Total Bilirubin 0.0 -  1.2 mg/dL 0.5 0.7 0.6  Alkaline Phos 39 - 117 IU/L 60 50 50  AST 0 - 40 IU/L 24 24 20   ALT 0 - 44 IU/L 15 13 17     08/05/12 AchR binding 33.80 (h), AchR blocking 36% (h), AchR modulating 38% (h), anti-striational (1:80 (h)  08/05/12 B12 643, TSH 4.07, FT4 1.1, BUN/Cr 21/1.7  08/06/12 CT chest - no thymoma, small nodules within the thyroid gland are likely benign, left lower lobe pulmonary nodule, Bosniak I cyst of the right kidney is partially imaged, coronary artery calcifications.  08/06/12 EMG/NCS  1. Decremental response on repetitive nerve stimulation study of greater than 30%, consistent with neuromuscular junction disorder such as myasthenia gravis. 2. No evidence of underlying myopathy or motor neuropathy. 3. Decreased motor unit recruitment of the left flexor carpi radialis muscle may indicate underlying cervical radiculopathy (C6, C7), but not confirmed on paraspinal muscle testing or nerve conduction studies.  01/21/14 CBC (WBC 4.5, Plt 207, LFTs normal)   ASSESSMENT AND PLAN  80 y.o. year old male  has a past medical history of CPAP (continuous positive airway pressure) dependence, GERD (gastroesophageal reflux disease), Hypertension, Myasthenia gravis ([place]), Sleep apnea, and Squamous carcinoma. here with myasthenia gravis based on history and exam, EMG and lab testing. Did well with mestinon and prednisone until he developed steroid psychosis/mania. Now transitioned to imuran and doing well since July 2014.  Problem  List: Myasthenia gravis - stable Steroid psychosis/mania - RESOLVED Thrombocytopenia - improved Bradycardia and irregular heart rate - RESOLVED  Myasthenia gravis without exacerbation (HCC)  OSA on CPAP    PLAN:  MYASTHENIA GRAVIS (established, stable) - stable; continue pyridostigmine 30mg  three times a day + imuran 100mg  daily - monitor labs (for imuran) q6 months  OSA on CPAP (established, stable) - stable; per Dr. Brett Fairy  Meds ordered this encounter  Medications   azaTHIOprine (IMURAN) 50 MG tablet    Sig: Take 2 tablets (100 mg total) by mouth daily.    Dispense:  180 tablet    Refill:  4   pyridostigmine (MESTINON) 60 MG tablet    Sig: Take 0.5 tablets (30 mg total) by mouth 3 (three) times daily.    Dispense:  135 tablet    Refill:  4   Return in about 1 year (around 02/12/2022).     Penni Bombard, MD 28/78/6767, 2:09 PM Certified in Neurology, Neurophysiology and Neuroimaging  [facility] [address] [place], [place] [address] [phone]

## 2021-04-02 DIAGNOSIS — L661 Lichen planopilaris: Secondary | ICD-10-CM | POA: Diagnosis not present

## 2021-04-02 DIAGNOSIS — L821 Other seborrheic keratosis: Secondary | ICD-10-CM | POA: Diagnosis not present

## 2021-04-02 DIAGNOSIS — L565 Disseminated superficial actinic porokeratosis (DSAP): Secondary | ICD-10-CM | POA: Diagnosis not present

## 2021-04-02 DIAGNOSIS — Z85828 Personal history of other malignant neoplasm of skin: Secondary | ICD-10-CM | POA: Diagnosis not present

## 2021-04-02 DIAGNOSIS — L570 Actinic keratosis: Secondary | ICD-10-CM | POA: Diagnosis not present

## 2021-07-11 DIAGNOSIS — R972 Elevated prostate specific antigen [PSA]: Secondary | ICD-10-CM | POA: Diagnosis not present

## 2021-07-11 DIAGNOSIS — U071 COVID-19: Secondary | ICD-10-CM | POA: Diagnosis not present

## 2021-07-11 DIAGNOSIS — N400 Enlarged prostate without lower urinary tract symptoms: Secondary | ICD-10-CM | POA: Diagnosis not present

## 2021-07-12 DIAGNOSIS — I10 Essential (primary) hypertension: Secondary | ICD-10-CM | POA: Diagnosis not present

## 2021-07-12 DIAGNOSIS — E785 Hyperlipidemia, unspecified: Secondary | ICD-10-CM | POA: Diagnosis not present

## 2021-07-12 DIAGNOSIS — R946 Abnormal results of thyroid function studies: Secondary | ICD-10-CM | POA: Diagnosis not present

## 2021-07-12 DIAGNOSIS — Z125 Encounter for screening for malignant neoplasm of prostate: Secondary | ICD-10-CM | POA: Diagnosis not present

## 2021-07-19 DIAGNOSIS — M8580 Other specified disorders of bone density and structure, unspecified site: Secondary | ICD-10-CM | POA: Diagnosis not present

## 2021-07-19 DIAGNOSIS — Z23 Encounter for immunization: Secondary | ICD-10-CM | POA: Diagnosis not present

## 2021-07-19 DIAGNOSIS — E785 Hyperlipidemia, unspecified: Secondary | ICD-10-CM | POA: Diagnosis not present

## 2021-07-19 DIAGNOSIS — I6529 Occlusion and stenosis of unspecified carotid artery: Secondary | ICD-10-CM | POA: Diagnosis not present

## 2021-07-19 DIAGNOSIS — G3184 Mild cognitive impairment, so stated: Secondary | ICD-10-CM | POA: Diagnosis not present

## 2021-07-19 DIAGNOSIS — Z1389 Encounter for screening for other disorder: Secondary | ICD-10-CM | POA: Diagnosis not present

## 2021-07-19 DIAGNOSIS — I10 Essential (primary) hypertension: Secondary | ICD-10-CM | POA: Diagnosis not present

## 2021-07-19 DIAGNOSIS — D696 Thrombocytopenia, unspecified: Secondary | ICD-10-CM | POA: Diagnosis not present

## 2021-07-19 DIAGNOSIS — R82998 Other abnormal findings in urine: Secondary | ICD-10-CM | POA: Diagnosis not present

## 2021-07-19 DIAGNOSIS — G4730 Sleep apnea, unspecified: Secondary | ICD-10-CM | POA: Diagnosis not present

## 2021-07-19 DIAGNOSIS — Z0000 Encounter for general adult medical examination without abnormal findings: Secondary | ICD-10-CM | POA: Diagnosis not present

## 2021-07-19 DIAGNOSIS — L309 Dermatitis, unspecified: Secondary | ICD-10-CM | POA: Diagnosis not present

## 2021-07-19 DIAGNOSIS — Z1331 Encounter for screening for depression: Secondary | ICD-10-CM | POA: Diagnosis not present

## 2021-07-19 DIAGNOSIS — I2510 Atherosclerotic heart disease of native coronary artery without angina pectoris: Secondary | ICD-10-CM | POA: Diagnosis not present

## 2021-08-24 DIAGNOSIS — U071 COVID-19: Secondary | ICD-10-CM | POA: Diagnosis not present

## 2021-09-06 DIAGNOSIS — H524 Presbyopia: Secondary | ICD-10-CM | POA: Diagnosis not present

## 2021-09-06 DIAGNOSIS — H2513 Age-related nuclear cataract, bilateral: Secondary | ICD-10-CM | POA: Diagnosis not present

## 2021-09-20 DIAGNOSIS — M8589 Other specified disorders of bone density and structure, multiple sites: Secondary | ICD-10-CM | POA: Diagnosis not present

## 2021-10-24 DIAGNOSIS — L570 Actinic keratosis: Secondary | ICD-10-CM | POA: Diagnosis not present

## 2021-10-24 DIAGNOSIS — Z85828 Personal history of other malignant neoplasm of skin: Secondary | ICD-10-CM | POA: Diagnosis not present

## 2021-10-24 DIAGNOSIS — L821 Other seborrheic keratosis: Secondary | ICD-10-CM | POA: Diagnosis not present

## 2022-02-15 DIAGNOSIS — Z23 Encounter for immunization: Secondary | ICD-10-CM | POA: Diagnosis not present

## 2022-02-18 ENCOUNTER — Encounter: Payer: Self-pay | Admitting: Diagnostic Neuroimaging

## 2022-02-18 ENCOUNTER — Ambulatory Visit (INDEPENDENT_AMBULATORY_CARE_PROVIDER_SITE_OTHER): Payer: Medicare Other | Admitting: Diagnostic Neuroimaging

## 2022-02-18 VITALS — BP 130/56 | HR 73 | Ht 69.0 in | Wt 197.4 lb

## 2022-02-18 DIAGNOSIS — G7000 Myasthenia gravis without (acute) exacerbation: Secondary | ICD-10-CM | POA: Diagnosis not present

## 2022-02-18 MED ORDER — PYRIDOSTIGMINE BROMIDE 60 MG PO TABS: 30.0000 mg | ORAL_TABLET | Freq: Three times a day (TID) | ORAL | 4 refills | Status: DC

## 2022-02-18 MED ORDER — AZATHIOPRINE 50 MG PO TABS: 100.0000 mg | ORAL_TABLET | Freq: Every day | ORAL | 4 refills | Status: DC

## 2022-02-18 NOTE — Progress Notes (Signed)
[facility]  PATIENT: Oscar Jacobs DOB: 1940-08-02  REFERRING CLINICIAN:  HISTORY FROM: patient and wife REASON FOR VISIT: follow up   HISTORICAL  CHIEF COMPLAINT:  Chief Complaint  Patient presents with   Myasthenia Gravis    Rm 7 One year FU wife- [name]  "no new concerns"     HISTORY OF PRESENT ILLNESS:   UPDATE (02/18/22, VRP): Since last visit, doing well. Symptoms are stable. Had mild COVID in Sept 2023. No other issues.   UPDATE (02/12/21, VRP): Since last visit, doing well. Symptoms are stable. Tolerating mestinon and imuran. Labs stable.   UPDATE (06/12/20, VRP): Since last visit, doing well. Symptoms are stable from MG standpoint. No alleviating or aggravating factors. Tolerating mestinon and imuran.    UPDATE (10/11/19, VRP): Since last visit, doing well. Symptoms are stable. Imuran '100mg'$  daily due to low plts; now 106 in Feb 2021.    UPDATE (11/25/17, VRP): Since last visit, doing well. Symptoms are mild. Severity is mild. No alleviating or aggravating factors. Tolerating meds. Has had new PSA elevation, and biopsy (low grade --> monitoring for now).   UPDATE 12/18/16: Since last visit, doing well. Tolerating meds. Not SOB, ptosis or generalized weakness.  UPDATE 02/09/16: Since last visit, doing well. No new symptoms. Tolerating medications. Staying active with yard work.   UPDATE 08/09/15: Since last visit, doing well. Cardiology consult and workup completed and pt reassured. MG is stable. Tolerating meds.   UPDATE 02/08/15: Since last visit, doing well. No new events. Tolerating imuran and mestinon. Today has some low heart rate (not normal) and irregular rhythm. No chest pain or shortness of breath. Family hx of cardiac arrhythmia in brother and brother's son.   UPDATE 08/09/14: Since last visit, doing well. No new sxs. Stable on imuran and mestinon. No weakness or SOB.   UPDATE 02/07/14: Since last visit, MG is stable. No SOB, weakness,  slurred speech or trouble swallowing. Has skin CA removed on leg (recurrent from 3 years ago). Has annual dermatology eval. Tolerating imuran '150mg'$  daily + mestinon '30mg'$  TID. Doing better with OSA and CPAP.  UPDATE 04/09/13: Continues to do well on imuran '150mg'$  daily. No side effects. Has been to PCP (Dr. Joylene Draft) for annual physical; PSA slightly elevated and has follow up. Patient has gained some weight (he thinks due to inactivity). Otherwise no concerns. No SOB, weakness, numbness.  UPDATE 12/29/12: Doing well on imuran '150mg'$  daily. No new sxs. No SOB. No weakness. Working in the yard.  UPDATE 11/06/12: Doing well on imuran '100mg'$  daily. No MG flares. Left ptosis is better. No mania or mood disturbances. Last labs were improved. Taking mestinon '30mg'$  TID. Sometimes has diarrhea, and then takes '15mg'$  dose.  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 He was kindly and expertly managed by medical and neurohospitalists, and tapered off prednisone, treated with IVIG, and then started on imuran. Doing well now. Mania is almost fully resolved. Still a bit "hyper" per wife, but much improved. Platelets trended down near end of hospital stay (last 120). Here for follow up labs as well.  UPDATE 09/10/12: Since last visit, doing much better on prednisone. Now on prednisone '60mg'$  daily. Taking calcium + Vit D, omeprazole. No side effects, except for slightly "buzzy" mood. Sleep apnea better. Strength improved. Almost back to normal. Some left ptosis persists. Some chewing difficulty towards end of meals.  UPDATE 08/13/12: Since last visit, EMG and lab testing  confirm myasthenia gravis diagnosis. I prescribed Mestinon for patient. He has had a positive response. He tried 30 mg 3 times a day, but now takes 15 mg 6 times a day, do to see increasing secretions and diarrhea. Medications lasting approximately 4 hours. No shortness of breath. Continues  to have left ptosis, some neck weakness and left arm weakness. Some mild slurred speech. No trouble swallowing.  PRIOR HPI (08/06/12): 81 year old right-handed male with history of hypertension, seasonal allergies, here for evaluation of possible myasthenia gravis. 6 weeks ago patient noted left eyelid drooping. No double vision. Over the next few weeks he had increasing weakness in keeping his head upright as well as difficulty with talking and chewing. Since this past Saturday symptoms have been significantly worse. He denies any shortness of breath or choking problems. He has mild weakness in his arms and no weakness in his legs. No headache, confusion, chest pain. No similar symptoms in the past. Patient does have diagnosis of sleep apnea, but he has not used his CPAP machine last 2 or 3 years.   REVIEW OF SYSTEMS: Full 14 system review of systems performed and negative: as per HPI.    ALLERGIES: Allergies  Allergen Reactions   Prednisone Other (See Comments)    manic   Penicillins Rash   Risperidone And Related Other (See Comments)    hyperactivity    HOME MEDICATIONS:  Outpatient Encounter Medications as of 02/18/2022  Medication Sig   calcium-vitamin D (OSCAL WITH D) 500-200 MG-UNIT per tablet Take 1 tablet by mouth 2 (two) times daily.   cholecalciferol (VITAMIN D) 1000 UNITS tablet Take 500 Units by mouth daily.    Cyanocobalamin (VITAMIN B-12 CR PO) Take 1,250 mg by mouth daily.   Multiple Vitamin (MULTIVITAMIN WITH MINERALS) TABS Take 1 tablet by mouth daily.   ramipril (ALTACE) 10 MG capsule Take 10 mg by mouth daily.   rosuvastatin (CRESTOR) 20 MG tablet Take 20 mg by mouth daily.   [DISCONTINUED] azaTHIOprine (IMURAN) 50 MG tablet Take 2 tablets (100 mg total) by mouth daily.   [DISCONTINUED] pyridostigmine (MESTINON) 60 MG tablet Take 0.5 tablets (30 mg total) by mouth 3 (three) times daily.   azaTHIOprine (IMURAN) 50 MG tablet Take 2 tablets (100 mg total) by mouth daily.    pyridostigmine (MESTINON) 60 MG tablet Take 0.5 tablets (30 mg total) by mouth 3 (three) times daily.   No facility-administered encounter medications on file as of 02/18/2022.     PAST MEDICAL HISTORY: Past Medical History:  Diagnosis Date   CPAP (continuous positive airway pressure) dependence    GERD (gastroesophageal reflux disease)    Hypertension    Myasthenia gravis ([place])    Sleep apnea    Squamous carcinoma     PAST SURGICAL HISTORY: Past Surgical History:  Procedure Laterality Date   COLONOSCOPY     Skin cancer removal     multiple    FAMILY HISTORY: Family History  Problem Relation Age of Onset   Heart disease Mother    Cancer - Other Father    Heart disease Sister    Heart disease Brother    Bipolar disorder Sister    Bipolar disorder Sister        suicide   Healthy Brother     SOCIAL HISTORY:  Social History   Socioeconomic History   Marital status: Married    Spouse name: Cherokee Clowers   Number of children: 3   Years of education: MD   Highest  education level: Not on file  Occupational History   Occupation: Retired    Comment: Radiology MD  Tobacco Use   Smoking status: Never   Smokeless tobacco: Never  Substance and Sexual Activity   Alcohol use: Yes    Comment: seldom   Drug use: No   Sexual activity: Not on file  Other Topics Concern   Not on file  Social History Narrative   Patient is married ([name]) and lives at home with his wife.   Patient has three adult children.   Patient is retired, Stage manager.   Married.  Ambulates unassisted.   Caffeine Use- 4 cups daily.   Patient is right-handed.   Patient has a college education.   Social Determinants of Health   Financial Resource Strain: Not on file  Food Insecurity: Not on file  Transportation Needs: Not on file  Physical Activity: Not on file  Stress: Not on file  Social Connections: Not on file  Intimate Partner Violence: Not on file     PHYSICAL  EXAM  GENERAL EXAM/CONSTITUTIONAL: Vitals:  Vitals:   02/18/22 1438  BP: (!) 130/56  Pulse: 73  Weight: 197 lb 6.4 oz (89.5 kg)  Height: '5\' 9"'$  (1.753 m)   Body mass index is 29.15 kg/m. Wt Readings from Last 3 Encounters:  02/18/22 197 lb 6.4 oz (89.5 kg)  02/12/21 202 lb 6.4 oz (91.8 kg)  01/04/21 197 lb (89.4 kg)   Patient is in no distress; well developed, nourished and groomed; neck is supple  CARDIOVASCULAR: Examination of carotid arteries is normal; no carotid bruits Regular rate and rhythm, no murmurs Examination of peripheral vascular system by observation and palpation is normal  EYES: Ophthalmoscopic exam of optic discs and posterior segments is normal; no papilledema or hemorrhages No results found.  MUSCULOSKELETAL: Gait, strength, tone, movements noted in Neurologic exam below  NEUROLOGIC: MENTAL STATUS:      No data to display         awake, alert, oriented to person, place and time recent and remote memory intact normal attention and concentration language fluent, comprehension intact, naming intact fund of knowledge appropriate  CRANIAL NERVE:  2nd - no papilledema on fundoscopic exam 2nd, 3rd, 4th, 6th - pupils equal and reactive to light, visual fields full to confrontation, extraocular muscles intact, no nystagmus --> EXCEPT MILD LEFT PTOSIS 5th - facial sensation symmetric 7th - facial strength symmetric 8th - hearing intact 9th - palate elevates symmetrically, uvula midline 11th - shoulder shrug symmetric 12th - tongue protrusion midline  MOTOR:  normal bulk and tone, full strength in the BUE, BLE  SENSORY:  normal and symmetric to light touch  COORDINATION:  finger-nose-finger, fine finger movements normal  REFLEXES:  deep tendon reflexes TRACE and symmetric  GAIT/STATION:  narrow based gait     DIAGNOSTIC DATA (LABS, IMAGING, TESTING) - I reviewed patient records, labs, notes, testing and imaging myself where  available.  Lab Results  Component Value Date   WBC 4.6 04/07/2019   HGB 11.9 (L) 04/07/2019   HCT 33.9 (L) 04/07/2019   MCV 102 (H) 04/07/2019   PLT 123 (L) 04/07/2019      Latest Ref Rng & Units 02/23/2019    9:11 AM 08/01/2015   10:03 AM 08/03/2014   10:56 AM  CMP  Glucose 65 - 99 mg/dL 104  93  92   BUN 8 - 27 mg/dL '22  28  22   '$ Creatinine 0.76 - 1.27 mg/dL 1.43  1.39  1.31   Sodium 134 - 144 mmol/L 140  141  140   Potassium 3.5 - 5.2 mmol/L 5.1  4.6  4.8   Chloride 96 - 106 mmol/L 104  101  102   CO2 20 - 29 mmol/L '22  23  22   '$ Calcium 8.6 - 10.2 mg/dL 9.4  9.6  9.1   Total Protein 6.0 - 8.5 g/dL 7.0  6.7  6.9   Total Bilirubin 0.0 - 1.2 mg/dL 0.5  0.7  0.6   Alkaline Phos 39 - 117 IU/L 60  50  50   AST 0 - 40 IU/L '24  24  20   '$ ALT 0 - 44 IU/L '15  13  17     '$ 08/05/12 AchR binding 33.80 (h), AchR blocking 36% (h), AchR modulating 38% (h), anti-striational (1:80 (h)  08/05/12 B12 643, TSH 4.07, FT4 1.1, BUN/Cr 21/1.7  08/06/12 CT chest - no thymoma, small nodules within the thyroid gland are likely benign, left lower lobe pulmonary nodule, Bosniak I cyst of the right kidney is partially imaged, coronary artery calcifications.  08/06/12 EMG/NCS  1. Decremental response on repetitive nerve stimulation study of greater than 30%, consistent with neuromuscular junction disorder such as myasthenia gravis. 2. No evidence of underlying myopathy or motor neuropathy. 3. Decreased motor unit recruitment of the left flexor carpi radialis muscle may indicate underlying cervical radiculopathy (C6, C7), but not confirmed on paraspinal muscle testing or nerve conduction studies.  01/21/14 CBC (WBC 4.5, Plt 207, LFTs normal)   ASSESSMENT AND PLAN  81 y.o. year old male  has a past medical history of CPAP (continuous positive airway pressure) dependence, GERD (gastroesophageal reflux disease), Hypertension, Myasthenia gravis ([place]), Sleep apnea, and Squamous carcinoma. here with myasthenia gravis  based on history and exam, EMG and lab testing. Did well with mestinon and prednisone until he developed steroid psychosis/mania. Now transitioned to imuran and doing well since July 2014.  Problem List: Myasthenia gravis - stable Steroid psychosis/mania - RESOLVED Thrombocytopenia - improved Bradycardia and irregular heart rate - RESOLVED  Myasthenia gravis without exacerbation ([facility])     PLAN:  MYASTHENIA GRAVIS (established, stable) - stable; continue pyridostigmine '30mg'$  three times a day + imuran '100mg'$  daily - monitor labs (for imuran) q6 months  OSA on CPAP (established, stable) - stable; per Dr. Brett Fairy  Meds ordered this encounter  Medications   azaTHIOprine (IMURAN) 50 MG tablet    Sig: Take 2 tablets (100 mg total) by mouth daily.    Dispense:  180 each    Refill:  4   pyridostigmine (MESTINON) 60 MG tablet    Sig: Take 0.5 tablets (30 mg total) by mouth 3 (three) times daily.    Dispense:  135 tablet    Refill:  4   Return in about 1 year (around 02/19/2023).     Penni Bombard, MD 65/78/4696, 2:95 PM Certified in Neurology, Neurophysiology and Neuroimaging  [facility] [address], [place], [place] [address] [phone]

## 2022-03-11 ENCOUNTER — Other Ambulatory Visit: Payer: Self-pay | Admitting: Diagnostic Neuroimaging

## 2022-03-13 DIAGNOSIS — N400 Enlarged prostate without lower urinary tract symptoms: Secondary | ICD-10-CM | POA: Diagnosis not present

## 2022-03-13 DIAGNOSIS — C61 Malignant neoplasm of prostate: Secondary | ICD-10-CM | POA: Diagnosis not present

## 2022-04-19 DIAGNOSIS — Z23 Encounter for immunization: Secondary | ICD-10-CM | POA: Diagnosis not present

## 2022-04-25 DIAGNOSIS — L565 Disseminated superficial actinic porokeratosis (DSAP): Secondary | ICD-10-CM | POA: Diagnosis not present

## 2022-04-25 DIAGNOSIS — D1801 Hemangioma of skin and subcutaneous tissue: Secondary | ICD-10-CM | POA: Diagnosis not present

## 2022-04-25 DIAGNOSIS — B351 Tinea unguium: Secondary | ICD-10-CM | POA: Diagnosis not present

## 2022-04-25 DIAGNOSIS — L570 Actinic keratosis: Secondary | ICD-10-CM | POA: Diagnosis not present

## 2022-04-25 DIAGNOSIS — L218 Other seborrheic dermatitis: Secondary | ICD-10-CM | POA: Diagnosis not present

## 2022-04-25 DIAGNOSIS — L821 Other seborrheic keratosis: Secondary | ICD-10-CM | POA: Diagnosis not present

## 2022-04-25 DIAGNOSIS — L661 Lichen planopilaris: Secondary | ICD-10-CM | POA: Diagnosis not present

## 2022-04-25 DIAGNOSIS — Z85828 Personal history of other malignant neoplasm of skin: Secondary | ICD-10-CM | POA: Diagnosis not present

## 2022-06-03 ENCOUNTER — Other Ambulatory Visit: Payer: Self-pay | Admitting: Internal Medicine

## 2022-06-03 DIAGNOSIS — E041 Nontoxic single thyroid nodule: Secondary | ICD-10-CM

## 2022-06-10 ENCOUNTER — Ambulatory Visit
Admission: RE | Admit: 2022-06-10 | Discharge: 2022-06-10 | Disposition: A | Discharge status: Home / Self Care | Payer: Medicare Other | Source: Ambulatory Visit | Attending: Internal Medicine | Admitting: Internal Medicine

## 2022-06-10 DIAGNOSIS — E041 Nontoxic single thyroid nodule: Secondary | ICD-10-CM

## 2022-08-15 NOTE — Patient Instructions (Signed)
Below is our plan:  We will continue current treatment plan. Monitor for leak at home. Let me know if CPAP machine continues to make unusual noises.   Please continue using your CPAP regularly. While your insurance requires that you use CPAP at least 4 hours each night on 70% of the nights, I recommend, that you not skip any nights and use it throughout the night if you can. Getting used to CPAP and staying with the treatment long term does take time and patience and discipline. Untreated obstructive sleep apnea when it is moderate to severe can have an adverse impact on cardiovascular health and raise her risk for heart disease, arrhythmias, hypertension, congestive heart failure, stroke and diabetes. Untreated obstructive sleep apnea causes sleep disruption, nonrestorative sleep, and sleep deprivation. This can have an impact on your day to day functioning and cause daytime sleepiness and impairment of cognitive function, memory loss, mood disturbance, and problems focussing. Using CPAP regularly can improve these symptoms.  We will update supply orders, today.   Please make sure you are staying well hydrated. I recommend 50-60 ounces daily. Well balanced diet and regular exercise encouraged. Consistent sleep schedule with 6-8 hours recommended.   Please continue follow up with care team as directed.   Follow up with Dr Marjory Lies in 01/2023 as scheduled   You may receive a survey regarding today's visit. I encourage you to leave honest feed back as I do use this information to improve patient care. Thank you for seeing me today!

## 2022-08-15 NOTE — Progress Notes (Signed)
PATIENT: Oscar Jacobs DOB: [birth year]  REASON FOR VISIT: follow up HISTORY FROM: patient  Chief Complaint  Patient presents with   Follow-up    Pt in room 1, wife in room. Here for cpap follow up. Pt states cpap is working well. Wife said machine makes noise and is loud when using.      HISTORY OF PRESENT ILLNESS:  08/21/22 ALL: Oscar Jacobs returns for follow up for MG and complex sleep apnea on CPAP.   He was last seen by Oscar Jacobs 01/2022. MG was reportedly stable and he was advised to continue pyridostigmine  TID and Imuran  daily. He feels he is doing well. He does have some stiffness in knees, worse in the mornings. No weakness. No changes in vision. He is followed by ophthalmology regularly.   He was last seen by Oscar Oscar Jacobs 12/2020 and doing well on CPAP. He continues CPAP therapy every night for about 6 hours. He has noted more whistling sounds, recently. He is using nasal mask but admits that he only changes it about every 4 months. He is using So Clean device.     02/25/2019 ALL: Oscar Jacobs is a 82 y.o. male here today for follow up for MG and OSA on CPAP. He is doing very well. Ptosis of left eye is stable. He notes worsening when he is tired. He is tolerating Imuran  daily and Mestinon 1/2 tablet TID. Recent labs reviewed. RBC 3.18, Hemoglobin 11.8, MCV 108, platelets 132.   He is doing well with CPAP therapy. He does note a leak and feels that this is related to mouth breathing. He uses nasal mask with chin strap but does not feel the chin strap is effective.  He also notes that there are times where he drifts off to sleep without placing CPAP therapy.  He feels that current pressure settings are appropriate.  Compliance report dated 01/25/2019 through 02/23/2019 reveals that he has used CPAP 23 of the last 30 days for compliance of 77%.  20 one of the last 30 days he used CPAP greater than 4 hours for compliance of 70%.  Average usage was 5 hours and 57 minutes.   AHI was 2.4 on 10 cm of water and an EPR of 3.  There was a significant leak noted in the 95th percentile of 27.7.  HISTORY: (copied from Oscar Dohmeier's note on 06/08/2018)  Oscar. Lorin Jacobs is seen today after his sleep study to discuss treatment options and alternatives. This study was performed on 11-24-13.  The patient at the time had an AHI of 64.8, an RDI of 66.0 disconsolate severe sleep apnea. He didn't have any periodic limb movements and device majority of his arousals with respiratory cost. Cardiac heart rate was very irregular between 54 and 77 beats per minute with variable R. to R interval's. It appears that this patient had sinus tachycardia and some wide complex tachycardia during sleep.  Atrial fibrillation was found, a condition that he shares with his son, paternal uncle.  The patient was titrated beginning at 5 cm water pressure to 10 cm water pressure even when reaching tandem his AHI was still 8.1 which is a reduction but not optimization of CPAP therapy. The patient was placed on 10 cm water pressure this 3 cm EPR and him and nasal mask instead of a full face mask. He also has lost some weight. His BMI was actually 36.7 at the time of the study in July. It appears that the patient has  very frequent , cyclic ,mostly central apneas. There was very little respiratory effort. He benefited from titration,  The diagnoses was complex apnea. He was prescribed the CPAP and has brought the machine here .  Compliance download.     CD: July 2015  Oscar. Lorin Jacobs, a retired physician, and  a patient of Oscar. Marjory Jacobs . He was seen in my sleep clinic last in 2008.   He was diagnosed with an AHI of 16 in 12-1 2012 .He has meanwhile developed myasthenia and treated by Oscar. Marjory Jacobs. He hadn't used CPAP in several years , felt "well enough ", but just a couple of days ago restarted using it. He brought the machine with him today. He has a severe residual AHi of 15 and he has noted severe air leaks.I  would like  for him to undergo a retest to see if apnea is still present, and if so, if treatment is indicated.  His myasthenia can certainly contribute to respiratory difficulties.  He takes antihistamines to help him sleep, and finds himself more drowsy in AM,  CPAP helped this , and he had less rhinitis while using it.  His usual bedtime is around 11 PM, and he falls asleep quickly.  He wakes once or twice for bathroom break, but usually sleeps through otherwise.His wife has reported that he is snoring again, she has also witnessed apneas which have concerned her.  The seems to be a strong positional component to snoring and apnea in this patient. He rises in the morning around 6:30 AM, he wakes up spontaneously and does not need another arm. He feels usually refreshed , has 2 coffees and has no urge to nap. If he is unstimulated , he drifts off, especially when the TV is boring. He usually sits in a lounge chair while napping. He has at least 7 hours of sleep. Oscar. Lorin Jacobs endorsed  the geriatric depression score at one point, the fatigue severity score at 37 points and the for a score at 8 points.    [place] P[place]ides were used, he was exposed to arsenic and tobacco gum.  His daughters work, one  as a Clinical research associate in [place], [place] , one is a [employer] teacher formerly in [place], now in [place]. Daughter is a Optometrist in [place].    His wife [name] is a Sports administrator in [place]. His mother died at age 11,  sister at 87 and wife has become caretaker of her brother, travels frequently to [place].  Previous notes :    Interval history from 06/03/2016,   I had the pleasure of seeing Oscar. Lorin Jacobs today for his yearly revisit for CPAP compliance. He has been 100% compliance for the last 30 days, with an average user time of 5 hours and 59 minutes his CPAP machine is set at 10 cm water pressure, his previous CPAP at been set at 13. He is doing excellently with a 3 cm water EPR, also  known as expiratory pressure relief and a residual AHI at 2.5. He does have some air leaks but they do not affect him on most days. In short I would not have to reset the machine to any other pressures his fatigue severity scale is in normal range at 24 points, his Epworth Sleepiness Scale result is excellent at 5 points. He has frequent sinusitis, mostly affecting the right. Sinusitis will sometimes drain and "strangling " him. About a month ago, he has a period of 3 days  with very dry airway. Used a Risk analyst in the bedroom and felt better.    Oscar. Lorin Jacobs would like to obtain a second, more portable, CPAP machine. I do have model of the travel CPAP but I can show him in the sleep lab. I know that the purchase price is close $1000. However this machine is much smaller very quiet and will download dated to the smart phone so that date I can later be merged into one record for compliance by [facility].   Interval history 06-08-2018, I have the pleasure of following up yearly, Oscar Oscar Jacobs has placed his older CPAP in [place], [place].  He will be due for a new CPAP in 11 month- I will be happy to order then one for him.  Oscar. Lorin Jacobs has 2 CPAP machines this 1 is the most commonly used and is located at his home here in [place], the patient used the machine 77% of the time 2 days out of these under 4 hours.  Average use a time on days used is 6 hours 26 minutes, the machine is set at 10 cmH2O pressure with 3 cm EPR he does have a higher than usual apnea hypopnea residual index at 5.6, and he does have moderate to mild air leakage.  Looking at his AHI it seems that this peaked from the 19th through 21 January and that most nights his AHI is under 5 . He uses a chin strap.  I like for him to use an autotitration machine next, and keep the nasal mask.  REVIEW OF SYSTEMS: Out of a complete 14 system review of symptoms, the patient complains only of the following symptoms, joint stiffness and all other  reviewed systems are negative.  ESS: 8/24  ALLERGIES: Allergies  Allergen Reactions   Prednisone Other (See Comments)    manic   Penicillins Rash   Risperidone And Related Other (See Comments)    hyperactivity    HOME MEDICATIONS: Outpatient Medications Prior to Visit  Medication Sig Dispense Refill   calcium-vitamin D (OSCAL WITH D) 500-200 MG-UNIT per tablet Take 1 tablet by mouth 2 (two) times daily.     cholecalciferol (VITAMIN D) 1000 UNITS tablet Take 500 Units by mouth daily.      Cyanocobalamin (VITAMIN B-12 CR PO) Take 1,250 mg by mouth daily.     Multiple Vitamin (MULTIVITAMIN WITH MINERALS) TABS Take 1 tablet by mouth daily.     ramipril (ALTACE) 10 MG capsule Take 10 mg by mouth daily.     rosuvastatin (CRESTOR) 20 MG tablet Take 20 mg by mouth daily.     azaTHIOprine (IMURAN) 50 MG tablet Take 2 tablets (100 mg total) by mouth daily. 180 each 4   pyridostigmine (MESTINON) 60 MG tablet TAKE 1/2 TABLET (30 MG TOTAL) BY MOUTH 3 (THREE) TIMES DAILY. 135 tablet 4   No facility-administered medications prior to visit.    PAST MEDICAL HISTORY: Past Medical History:  Diagnosis Date   CPAP (continuous positive airway pressure) dependence    GERD (gastroesophageal reflux disease)    Hypertension    Myasthenia gravis    Sleep apnea    Squamous carcinoma     PAST SURGICAL HISTORY: Past Surgical History:  Procedure Laterality Date   COLONOSCOPY     Skin cancer removal     multiple    FAMILY HISTORY: Family History  Problem Relation Age of Onset   Heart disease Mother    Cancer - Other Father  Heart disease Sister    Heart disease Brother    Bipolar disorder Sister    Bipolar disorder Sister        suicide   Healthy Brother     SOCIAL HISTORY: Social History   Socioeconomic History   Marital status: Married    Spouse name: [name] Tevita Gomer   Number of children: 3   Years of education: MD   Highest education level: Not on file  Occupational  History   Occupation: Retired    Comment: Radiology MD  Tobacco Use   Smoking status: Never   Smokeless tobacco: Never  Substance and Sexual Activity   Alcohol use: Yes    Comment: seldom   Drug use: No   Sexual activity: Not on file  Other Topics Concern   Not on file  Social History Narrative   Patient is married ([name]) and lives at home with his wife.   Patient has three adult children.   Patient is retired, Marine scientist.   Married.  Ambulates unassisted.   Caffeine Use- 4 cups daily.   Patient is right-handed.   Patient has a college education.   Social Determinants of Health   Financial Resource Strain: Not on file  Food Insecurity: Not on file  Transportation Needs: Not on file  Physical Activity: Not on file  Stress: Not on file  Social Connections: Not on file  Intimate Partner Violence: Not on file      PHYSICAL EXAM  Vitals:   08/21/22 0804  BP: (!) 149/63  Pulse: 65  Weight: 199 lb 8 oz (90.5 kg)  Height: 5\' 9"  (1.753 m)    Body mass index is 29.46 kg/m.  Generalized: Well developed, in no acute distress  Cardiology: normal rate and rhythm, no murmur noted Respiratory: Clear to auscultation bilaterally Neurological examination  Mentation: Alert oriented to time, place, history taking. Follows all commands speech and language fluent Cranial nerve II-XII: Pupils were equal round reactive to light. Extraocular movements were full, visual field were full on confrontational test. Facial sensation and strength were normal. Uvula tongue midline. Head turning and shoulder shrug  were normal and symmetric. Motor: The motor testing reveals 5 over 5 strength of all 4 extremities. Good symmetric motor tone is noted throughout.  Sensory: Sensory testing is intact to soft touch on all 4 extremities. No evidence of extinction is noted.  Coordination: Cerebellar testing reveals good finger-nose-finger and heel-to-shin bilaterally.  Gait and station: Gait is  normal.   DIAGNOSTIC DATA (LABS, IMAGING, TESTING) - I reviewed patient records, labs, notes, testing and imaging myself where available.      No data to display           Lab Results  Component Value Date   WBC 4.6 04/07/2019   HGB 11.9 (L) 04/07/2019   HCT 33.9 (L) 04/07/2019   MCV 102 (H) 04/07/2019   PLT 123 (L) 04/07/2019      Component Value Date/Time   NA 140 02/23/2019 0911   K 5.1 02/23/2019 0911   CL 104 02/23/2019 0911   CO2 22 02/23/2019 0911   GLUCOSE 104 (H) 02/23/2019 0911   GLUCOSE 91 09/21/2012 0421   BUN 22 02/23/2019 0911   CREATININE 1.43 (H) 02/23/2019 0911   CALCIUM 9.4 02/23/2019 0911   PROT 7.0 02/23/2019 0911   ALBUMIN 4.5 02/23/2019 0911   AST 24 02/23/2019 0911   ALT 15 02/23/2019 0911   ALKPHOS 60 02/23/2019 0911   BILITOT 0.5 02/23/2019 0911  GFRNONAA 46 (L) 02/23/2019 0911   GFRAA 53 (L) 02/23/2019 0911   No results found for: "CHOL", "HDL", "LDLCALC", "LDLDIRECT", "TRIG", "CHOLHDL" No results found for: "HGBA1C" No results found for: "VITAMINB12" No results found for: "TSH"     ASSESSMENT AND PLAN 82 y.o. year old male  has a past medical history of CPAP (continuous positive airway pressure) dependence, GERD (gastroesophageal reflux disease), Hypertension, Myasthenia gravis, Sleep apnea, and Squamous carcinoma. here with     ICD-10-CM   1. Myasthenia gravis without exacerbation  G70.00     2. Sleep apnea treated with continuous positive airway pressure (CPAP)  G47.30 For home use only DME continuous positive airway pressure (CPAP)    3. Complex sleep apnea syndrome  G47.31        Overall Piero is doing very well.  We have discussed concerns of whistling noises. Large leak noted at 38 l/min. He was encouraged to replace nasal mask every 30 days and monitor for correct seal at home. He was encouraged to use CPAP therapy nightly and for greater than 4 hours each night.  We will continue Mestinon  (half tablet) 3 times daily  and Imuran  daily. Recent labs with PCP were unremarkable. Marland Kitchen  He will monitor closely for any worsening weakness or other concerning symptoms.  He will continue to monitor blood pressure and follow-up with primary care as directed. He has scheduled follow up with Oscar Jacobs 01/2023. I look forward to seeing him back with me pending next follow up. He verbalizes understanding and agreement with this plan.   Orders Placed This Encounter  Procedures   For home use only DME continuous positive airway pressure (CPAP)    Supplies    Order Specific Question:   Length of Need    Answer:   Lifetime    Order Specific Question:   Patient has OSA or probable OSA    Answer:   Yes    Order Specific Question:   Is the patient currently using CPAP in the home    Answer:   Yes    Order Specific Question:   Settings    Answer:   Other see comments    Order Specific Question:   CPAP supplies needed    Answer:   Mask, headgear, cushions, filters, heated tubing and water chamber     Meds ordered this encounter  Medications   azaTHIOprine (IMURAN) 50 MG tablet    Sig: Take 2 tablets (100 mg total) by mouth daily.    Dispense:  180 tablet    Refill:  4    Order Specific Question:   Supervising Provider    Answer:   Anson Fret [[id number]]   pyridostigmine (MESTINON) 60 MG tablet    Sig: Take 0.5 tablets (30 mg total) by mouth 3 (three) times daily.    Dispense:  135 tablet    Refill:  4    Order Specific Question:   Supervising Provider    Answer:   Anson Fret [id number]      I spent 30 minutes with the patient. 50% of this time was spent counseling and educating patient on plan of care and medications.    Shawnie Dapper, FNP-C 08/21/2022, 8:45 AM [facility] [address], [address] [place], [place] [address] [phone]

## 2022-08-21 ENCOUNTER — Encounter: Payer: Self-pay | Admitting: Family Medicine

## 2022-08-21 ENCOUNTER — Ambulatory Visit: Payer: Medicare Other | Admitting: Family Medicine

## 2022-08-21 VITALS — BP 149/63 | HR 65 | Ht 69.0 in | Wt 199.5 lb

## 2022-08-21 DIAGNOSIS — G7000 Myasthenia gravis without (acute) exacerbation: Secondary | ICD-10-CM

## 2022-08-21 DIAGNOSIS — G4730 Sleep apnea, unspecified: Secondary | ICD-10-CM

## 2022-08-21 DIAGNOSIS — G4739 Other sleep apnea: Secondary | ICD-10-CM

## 2022-08-21 DIAGNOSIS — G4731 Primary central sleep apnea: Secondary | ICD-10-CM

## 2022-08-21 MED ORDER — AZATHIOPRINE 50 MG PO TABS: 100.0000 mg | ORAL_TABLET | Freq: Every day | ORAL | 4 refills | Status: DC

## 2022-08-21 MED ORDER — PYRIDOSTIGMINE BROMIDE 60 MG PO TABS: 30.0000 mg | ORAL_TABLET | Freq: Three times a day (TID) | ORAL | 4 refills | Status: DC

## 2022-08-31 ENCOUNTER — Other Ambulatory Visit: Payer: Self-pay | Admitting: Diagnostic Neuroimaging

## 2022-11-01 ENCOUNTER — Encounter: Payer: Self-pay | Admitting: Cardiovascular Disease

## 2022-11-01 ENCOUNTER — Ambulatory Visit: Payer: Medicare Other | Attending: Cardiovascular Disease | Admitting: Cardiovascular Disease

## 2022-11-01 VITALS — BP 148/66 | HR 72 | Ht 69.0 in | Wt 204.4 lb

## 2022-11-01 DIAGNOSIS — G4733 Obstructive sleep apnea (adult) (pediatric): Secondary | ICD-10-CM | POA: Diagnosis not present

## 2022-11-01 DIAGNOSIS — E785 Hyperlipidemia, unspecified: Secondary | ICD-10-CM | POA: Insufficient documentation

## 2022-11-01 DIAGNOSIS — I10 Essential (primary) hypertension: Secondary | ICD-10-CM

## 2022-11-01 DIAGNOSIS — R931 Abnormal findings on diagnostic imaging of heart and coronary circulation: Secondary | ICD-10-CM | POA: Insufficient documentation

## 2022-11-01 DIAGNOSIS — R001 Bradycardia, unspecified: Secondary | ICD-10-CM | POA: Diagnosis not present

## 2022-11-01 DIAGNOSIS — E782 Mixed hyperlipidemia: Secondary | ICD-10-CM

## 2022-11-01 DIAGNOSIS — R0989 Other specified symptoms and signs involving the circulatory and respiratory systems: Secondary | ICD-10-CM

## 2022-11-01 NOTE — Assessment & Plan Note (Signed)
History of obstructive sleep apnea on CPAP. 

## 2022-11-01 NOTE — Assessment & Plan Note (Signed)
Coronary calcium score performed 05/25/2018 was 1879.  He is below goal for secondary prevention with regards to lipids and is completely asymptomatic.

## 2022-11-01 NOTE — Assessment & Plan Note (Signed)
History of hyperlipidemia on statin therapy with lipid profile performed by his PCP 08/12/22 revealing total cholesterol 126, LDL 48 and HDL 65.

## 2022-11-01 NOTE — Patient Instructions (Signed)
Medication Instructions:  Your physician recommends that you continue on your current medications as directed. Please refer to the Current Medication list given to you today.  *If you need a refill on your cardiac medications before your next appointment, please call your pharmacy*   Testing/Procedures: Your physician has requested that you have a carotid duplex. This test is an ultrasound of the carotid arteries in your neck. It looks at blood flow through these arteries that supply the brain with blood. Allow one hour for this exam. There are no restrictions or special instructions. This will take place at [address], [address].   Follow-Up: At [facility], you and your health needs are our priority.  As part of our continuing mission to provide you with exceptional heart care, we have created designated Provider Care Teams.  These Care Teams include your primary Cardiologist (physician) and Advanced Practice Providers (APPs -  Physician Assistants and Nurse Practitioners) who all work together to provide you with the care you need, when you need it.  We recommend signing up for the patient portal called "MyChart".  Sign up information is provided on this After Visit Summary.  MyChart is used to connect with patients for Virtual Visits (Telemedicine).  Patients are able to view lab/test results, encounter notes, upcoming appointments, etc.  Non-urgent messages can be sent to your provider as well.   To learn more about what you can do with MyChart, go to [email or web address].    Your next appointment:   12 month(s)  Provider:   Jonathan Berry, MD    

## 2022-11-01 NOTE — Progress Notes (Signed)
11/01/2022 Oscar Jacobs   Jul 11, 1940  [id number]  Primary Physician Oscar Ran, MD Primary Cardiologist: Oscar Gess MD Oscar Jacobs  HPI:  Oscar Jacobs is a 82 y.o. mildly overweight married Caucasian male father of 3 daughters, grandfather of 3 grandchildren who is accompanied by his wife Oscar who is a Film/video editor.  He was referred by his PCP, Dr. Eulas Jacobs, to be established because of vascular risk factors and elevated coronary calcium score.  He is retired Marine scientist.  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  His cardiac risk factors are notable for treated hypertension and hyperlipidemia.  He is never smoked.  There is no family history for heart disease.  Is never had a heart attack or stroke.  He denies chest pain or shortness of breath.  He does have obstructive sleep apnea on CPAP.  He does have myasthenia gravis as well.   Current Meds  Medication Sig   azaTHIOprine (IMURAN) 50 MG tablet Take 2 tablets (100 mg total) by mouth daily.   calcium-vitamin D (OSCAL WITH D) 500-200 MG-UNIT per tablet Take 1 tablet by mouth 2 (two) times daily.   cholecalciferol (VITAMIN D) 1000 UNITS tablet Take 500 Units by mouth daily.    co-enzyme Q-10 30 MG capsule Take 30 mg by mouth 3 (three) times daily.   Cyanocobalamin (VITAMIN B-12 CR PO) Take 1,250 mg by mouth daily.   Multiple Vitamin (MULTIVITAMIN WITH MINERALS) TABS Take 1 tablet by mouth daily.   pyridostigmine (MESTINON) 60 MG tablet TAKE ONE-HALF TABLET BY MOUTH 3  TIMES DAILY   ramipril (ALTACE) 10 MG capsule Take 10 mg by mouth daily.   rosuvastatin (CRESTOR) 20 MG tablet Take 20 mg by mouth daily.     Allergies  Allergen Reactions   Prednisone Other (See Comments)    manic   Penicillins Rash   Risperidone And Related Other (See Comments)    hyperactivity    Social History   Socioeconomic History   Marital status: Married    Spouse name: [name] Shia Jacobs    Number of children: 3   Years of education: MD   Highest education level: Not on file  Occupational History   Occupation: Retired    Comment: Radiology MD  Tobacco Use   Smoking status: Never   Smokeless tobacco: Never  Substance and Sexual Activity   Alcohol use: Yes    Comment: seldom   Drug use: No   Sexual activity: Not on file  Other Topics Concern   Not on file  Social History Narrative   Patient is married ([name]) and lives at home with his wife.   Patient has three adult children.   Patient is retired, Marine scientist.   Married.  Ambulates unassisted.   Caffeine Use- 4 cups daily.   Patient is right-handed.   Patient has a college education.   Social Determinants of Health   Financial Resource Strain: Not on file  Food Insecurity: Not on file  Transportation Needs: Not on file  Physical Activity: Not on file  Stress: Not on file  Social Connections: Not on file  Intimate Partner Violence: Not on file     Review of Systems: General: negative for chills, fever, night sweats or weight changes.  Cardiovascular: negative for chest pain, dyspnea on exertion, edema, orthopnea, palpitations, paroxysmal nocturnal dyspnea or shortness of breath Dermatological: negative for rash Respiratory: negative for cough or wheezing Urologic: negative for hematuria  Abdominal: negative for nausea, vomiting, diarrhea, bright red blood per rectum, melena, or hematemesis Neurologic: negative for visual changes, syncope, or dizziness All other systems reviewed and are otherwise negative except as noted above.    Blood pressure (!) 148/66, pulse 72, height 5\' 9"  (1.753 m), weight 204 lb 6.4 oz (92.7 kg), SpO2 99 %.  General appearance: alert and no distress Neck: no adenopathy, no JVD, supple, symmetrical, trachea midline, thyroid not enlarged, symmetric, no tenderness/mass/nodules, and bilateral carotid bruits Lungs: clear to auscultation bilaterally Heart: regular rate and rhythm,  S1, S2 normal, no murmur, click, rub or gallop Extremities: extremities normal, atraumatic, no cyanosis or edema Pulses: 2+ and symmetric Skin: Skin color, texture, turgor normal. No rashes or lesions Neurologic: Grossly normal  EKG EKG Interpretation Date/Time:  Friday November 01 2022 13:56:20 EDT Ventricular Rate:  72 PR Interval:  214 QRS Duration:  98 QT Interval:  382 QTC Calculation: 418 R Axis:   7  Text Interpretation: Sinus rhythm with marked sinus arrhythmia with 1st degree A-V block Confirmed by Nanetta Batty [phone]) on 11/01/2022 2:17:13 PM    ASSESSMENT AND PLAN:   HTN (hypertension) History of essential hypertension with blood pressure measured today at 148/66.  He is on ramipril.  Obstructive sleep apnea History of obstructive sleep apnea on CPAP  Hyperlipidemia History of hyperlipidemia on statin therapy with lipid profile performed by his PCP 08/12/22 revealing total cholesterol 126, LDL 48 and HDL 65.  Elevated coronary artery calcium score Coronary calcium score performed 05/25/2018 was 1879.  He is below goal for secondary prevention with regards to lipids and is completely asymptomatic.     Oscar Gess MD FACP,FACC,FAHA, [facility] 11/01/2022 2:21 PM

## 2022-11-01 NOTE — Assessment & Plan Note (Signed)
History of essential hypertension with blood pressure measured today at 148/66.  He is on ramipril.

## 2022-11-11 ENCOUNTER — Ambulatory Visit (HOSPITAL_COMMUNITY)
Admission: RE | Admit: 2022-11-11 | Discharge: 2022-11-11 | Disposition: A | Discharge status: Home / Self Care | Payer: Medicare Other | Source: Ambulatory Visit | Attending: Cardiovascular Disease | Admitting: Cardiovascular Disease

## 2022-11-11 DIAGNOSIS — R0989 Other specified symptoms and signs involving the circulatory and respiratory systems: Secondary | ICD-10-CM

## 2022-11-11 DIAGNOSIS — E782 Mixed hyperlipidemia: Secondary | ICD-10-CM | POA: Diagnosis not present

## 2022-11-11 DIAGNOSIS — I10 Essential (primary) hypertension: Secondary | ICD-10-CM | POA: Diagnosis not present

## 2022-11-14 ENCOUNTER — Other Ambulatory Visit (HOSPITAL_COMMUNITY): Payer: Self-pay | Admitting: *Deleted

## 2022-11-14 DIAGNOSIS — R0989 Other specified symptoms and signs involving the circulatory and respiratory systems: Secondary | ICD-10-CM

## 2023-01-14 ENCOUNTER — Encounter: Payer: Self-pay | Admitting: Diagnostic Neuroimaging

## 2023-01-14 ENCOUNTER — Ambulatory Visit: Payer: Medicare Other | Admitting: Diagnostic Neuroimaging

## 2023-01-14 ENCOUNTER — Telehealth: Payer: Self-pay | Admitting: Diagnostic Neuroimaging

## 2023-01-14 VITALS — BP 123/64 | HR 67 | Ht 69.0 in | Wt 198.6 lb

## 2023-01-14 DIAGNOSIS — G3184 Mild cognitive impairment, so stated: Secondary | ICD-10-CM

## 2023-01-14 MED ORDER — AZATHIOPRINE 50 MG PO TABS: 100.0000 mg | ORAL_TABLET | Freq: Every day | ORAL | 4 refills | Status: DC

## 2023-01-14 MED ORDER — PYRIDOSTIGMINE BROMIDE 60 MG PO TABS: 30.0000 mg | ORAL_TABLET | Freq: Three times a day (TID) | ORAL | 4 refills | Status: DC

## 2023-01-14 NOTE — Telephone Encounter (Signed)
[employer] medicare NPR sent to GI [phone]

## 2023-01-14 NOTE — Progress Notes (Signed)
[facility]  PATIENT: Oscar Jacobs DOB: [birth year]  REFERRING CLINICIAN: Olevia Perches, NP  HISTORY FROM: patient and wife REASON FOR VISIT: follow up   HISTORICAL  CHIEF COMPLAINT:  Chief Complaint  Patient presents with   Follow-up    Patient in room #7 with his wife. Patient states he's here today to follow up with his memory.    HISTORY OF PRESENT ILLNESS:   UPDATE (01/14/23, VRP): Since last visit, MG stable. However, here for memory loss eval. Wife noted mild sxs x 2 years. Short term recall, navigation, recent events, remembering names and financial transactions have been more difficult and causing some marital strain. Able to maintain personal hygiene, bathing, dressing. Able to drive short distances. Still manages some of his own bank accounts.   UPDATE (02/18/22, VRP): Since last visit, doing well. Symptoms are stable. Had mild COVID in Sept 2023. No other issues.   UPDATE (02/12/21, VRP): Since last visit, doing well. Symptoms are stable. Tolerating mestinon and imuran. Labs stable.   UPDATE (06/12/20, VRP): Since last visit, doing well. Symptoms are stable from MG standpoint. No alleviating or aggravating factors. Tolerating mestinon and imuran.    UPDATE (10/11/19, VRP): Since last visit, doing well. Symptoms are stable. Imuran 100mg  daily due to low plts; now 106 in Feb 2021.    UPDATE (11/25/17, VRP): Since last visit, doing well. Symptoms are mild. Severity is mild. No alleviating or aggravating factors. Tolerating meds. Has had new PSA elevation, and biopsy (low grade --> monitoring for now).   UPDATE 12/18/16: Since last visit, doing well. Tolerating meds. Not SOB, ptosis or generalized weakness.  UPDATE 02/09/16: Since last visit, doing well. No new symptoms. Tolerating medications. Staying active with yard work.   UPDATE 08/09/15: Since last visit, doing well. Cardiology consult and workup completed and pt reassured. MG is stable. Tolerating  meds.   UPDATE 02/08/15: Since last visit, doing well. No new events. Tolerating imuran and mestinon. Today has some low heart rate (not normal) and irregular rhythm. No chest pain or shortness of breath. Family hx of cardiac arrhythmia in brother and brother's son.   UPDATE 08/09/14: Since last visit, doing well. No new sxs. Stable on imuran and mestinon. No weakness or SOB.   UPDATE 02/07/14: Since last visit, MG is stable. No SOB, weakness, slurred speech or trouble swallowing. Has skin CA removed on leg (recurrent from 3 years ago). Has annual dermatology eval. Tolerating imuran 150mg  daily + mestinon 30mg  TID. Doing better with OSA and CPAP.  UPDATE 04/09/13: Continues to do well on imuran 150mg  daily. No side effects. Has been to PCP (Dr. Waynard Edwards) for annual physical; PSA slightly elevated and has follow up. Patient has gained some weight (he thinks due to inactivity). Otherwise no concerns. No SOB, weakness, numbness.  UPDATE 12/29/12: Doing well on imuran 150mg  daily. No new sxs. No SOB. No weakness. Working in the yard.  UPDATE 11/06/12: Doing well on imuran 100mg  daily. No MG flares. Left ptosis is better. No mania or mood disturbances. Last labs were improved. Taking mestinon 30mg  TID. Sometimes has diarrhea, and then takes 15mg  dose.  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 He was kindly and expertly managed by medical and neurohospitalists, and tapered off prednisone, treated with IVIG, and then started on imuran. Doing well now. Mania is almost fully resolved. Still a bit "hyper" per wife, but much improved. Platelets trended down near end  of hospital stay (last 120). Here for follow up labs as well.  UPDATE 09/10/12: Since last visit, doing much better on prednisone. Now on prednisone 60mg  daily. Taking calcium + Vit D, omeprazole. No side effects, except for slightly "buzzy" mood. Sleep apnea better. Strength  improved. Almost back to normal. Some left ptosis persists. Some chewing difficulty towards end of meals.  UPDATE 08/13/12: Since last visit, EMG and lab testing confirm myasthenia gravis diagnosis. I prescribed Mestinon for patient. He has had a positive response. He tried 30 mg 3 times a day, but now takes 15 mg 6 times a day, do to see increasing secretions and diarrhea. Medications lasting approximately 4 hours. No shortness of breath. Continues to have left ptosis, some neck weakness and left arm weakness. Some mild slurred speech. No trouble swallowing.  PRIOR HPI (08/06/12): 82 year old right-handed male with history of hypertension, seasonal allergies, here for evaluation of possible myasthenia gravis. 6 weeks ago patient noted left eyelid drooping. No double vision. Over the next few weeks he had increasing weakness in keeping his head upright as well as difficulty with talking and chewing. Since this past Saturday symptoms have been significantly worse. He denies any shortness of breath or choking problems. He has mild weakness in his arms and no weakness in his legs. No headache, confusion, chest pain. No similar symptoms in the past. Patient does have diagnosis of sleep apnea, but he has not used his CPAP machine last 2 or 3 years.   REVIEW OF SYSTEMS: Full 14 system review of systems performed and negative: as per HPI.    ALLERGIES: Allergies  Allergen Reactions   Prednisone Other (See Comments)    manic   Penicillins Rash   Risperidone And Related Other (See Comments)    hyperactivity    HOME MEDICATIONS:  Outpatient Encounter Medications as of 01/14/2023  Medication Sig   calcium-vitamin D (OSCAL WITH D) 500-200 MG-UNIT per tablet Take 1 tablet by mouth 2 (two) times daily.   cholecalciferol (VITAMIN D) 1000 UNITS tablet Take 500 Units by mouth daily.    co-enzyme Q-10 30 MG capsule Take 30 mg by mouth 3 (three) times daily.   Cyanocobalamin (VITAMIN B-12 CR PO) Take 1,250 mg  by mouth daily.   Multiple Vitamin (MULTIVITAMIN WITH MINERALS) TABS Take 1 tablet by mouth daily.   ramipril (ALTACE) 10 MG capsule Take 10 mg by mouth daily.   rosuvastatin (CRESTOR) 20 MG tablet Take 20 mg by mouth daily.   [DISCONTINUED] azaTHIOprine (IMURAN) 50 MG tablet Take 2 tablets (100 mg total) by mouth daily.   [DISCONTINUED] pyridostigmine (MESTINON) 60 MG tablet TAKE ONE-HALF TABLET BY MOUTH 3  TIMES DAILY   azaTHIOprine (IMURAN) 50 MG tablet Take 2 tablets (100 mg total) by mouth daily.   pyridostigmine (MESTINON) 60 MG tablet Take 0.5 tablets (30 mg total) by mouth 3 (three) times daily.   No facility-administered encounter medications on file as of 01/14/2023.     PAST MEDICAL HISTORY: Past Medical History:  Diagnosis Date   CPAP (continuous positive airway pressure) dependence    GERD (gastroesophageal reflux disease)    Hypertension    Myasthenia gravis (HCC)    Sleep apnea    Squamous carcinoma     PAST SURGICAL HISTORY: Past Surgical History:  Procedure Laterality Date   COLONOSCOPY     Skin cancer removal     multiple    FAMILY HISTORY: Family History  Problem Relation Age of Onset   Heart disease  Mother    Cancer - Other Father    Heart disease Sister    Heart disease Brother    Bipolar disorder Sister    Bipolar disorder Sister        suicide   Healthy Brother     SOCIAL HISTORY:  Social History   Socioeconomic History   Marital status: Married    Spouse name: [name] Sahej Amberson   Number of children: 3   Years of education: MD   Highest education level: Not on file  Occupational History   Occupation: Retired    Comment: Radiology MD  Tobacco Use   Smoking status: Never   Smokeless tobacco: Never  Substance and Sexual Activity   Alcohol use: Yes    Comment: seldom   Drug use: No   Sexual activity: Not on file  Other Topics Concern   Not on file  Social History Narrative   Patient is married ([name]) and lives at home with  his wife.   Patient has three adult children.   Patient is retired, Marine scientist.   Married.  Ambulates unassisted.   Caffeine Use- 4 cups daily.   Patient is right-handed.   Patient has a college education.   Social Determinants of Health   Financial Resource Strain: Not on file  Food Insecurity: Not on file  Transportation Needs: Not on file  Physical Activity: Not on file  Stress: Not on file  Social Connections: Not on file  Intimate Partner Violence: Not on file     PHYSICAL EXAM  GENERAL EXAM/CONSTITUTIONAL: Vitals:  Vitals:   01/14/23 0815  BP: 123/64  Pulse: 67  Weight: 198 lb 9.6 oz (90.1 kg)  Height: 5\' 9"  (1.753 m)   Body mass index is 29.33 kg/m. Wt Readings from Last 3 Encounters:  01/14/23 198 lb 9.6 oz (90.1 kg)  11/01/22 204 lb 6.4 oz (92.7 kg)  08/21/22 199 lb 8 oz (90.5 kg)   Patient is in no distress; well developed, nourished and groomed; neck is supple  CARDIOVASCULAR: Examination of carotid arteries is normal; no carotid bruits Regular rate and rhythm, no murmurs Examination of peripheral vascular system by observation and palpation is normal  EYES: Ophthalmoscopic exam of optic discs and posterior segments is normal; no papilledema or hemorrhages No results found.  MUSCULOSKELETAL: Gait, strength, tone, movements noted in Neurologic exam below  NEUROLOGIC: MENTAL STATUS:     01/14/2023    8:20 AM  MMSE - Mini Mental State Exam  Orientation to time 5  Orientation to Place 5  Registration 3  Attention/ Calculation 1  Recall 0  Language- name 2 objects 2  Language- repeat 1  Language- follow 3 step command 3  Language- read & follow direction 1  Write a sentence 1  Copy design 1  Total score 23   awake, alert, oriented to person, place and time recent and remote memory intact normal attention and concentration language fluent, comprehension intact, naming intact fund of knowledge appropriate  CRANIAL NERVE:  2nd - no  papilledema on fundoscopic exam 2nd, 3rd, 4th, 6th - pupils equal and reactive to light, visual fields full to confrontation, extraocular muscles intact, no nystagmus --> EXCEPT MILD LEFT PTOSIS 5th - facial sensation symmetric 7th - facial strength symmetric 8th - hearing intact 9th - palate elevates symmetrically, uvula midline 11th - shoulder shrug symmetric 12th - tongue protrusion midline  MOTOR:  normal bulk and tone, full strength in the BUE, BLE  SENSORY:  normal and symmetric  to light touch  COORDINATION:  finger-nose-finger, fine finger movements normal  REFLEXES:  deep tendon reflexes TRACE and symmetric  GAIT/STATION:  narrow based gait     DIAGNOSTIC DATA (LABS, IMAGING, TESTING) - I reviewed patient records, labs, notes, testing and imaging myself where available.  Lab Results  Component Value Date   WBC 4.6 04/07/2019   HGB 11.9 (L) 04/07/2019   HCT 33.9 (L) 04/07/2019   MCV 102 (H) 04/07/2019   PLT 123 (L) 04/07/2019      Latest Ref Rng & Units 02/23/2019    9:11 AM 08/01/2015   10:03 AM 08/03/2014   10:56 AM  CMP  Glucose 65 - 99 mg/dL 409  93  92   BUN 8 - 27 mg/dL 22  28  22    Creatinine 0.76 - 1.27 mg/dL 8.11  9.14  7.82   Sodium 134 - 144 mmol/L 140  141  140   Potassium 3.5 - 5.2 mmol/L 5.1  4.6  4.8   Chloride 96 - 106 mmol/L 104  101  102   CO2 20 - 29 mmol/L 22  23  22    Calcium 8.6 - 10.2 mg/dL 9.4  9.6  9.1   Total Protein 6.0 - 8.5 g/dL 7.0  6.7  6.9   Total Bilirubin 0.0 - 1.2 mg/dL 0.5  0.7  0.6   Alkaline Phos 39 - 117 IU/L 60  50  50   AST 0 - 40 IU/L 24  24  20    ALT 0 - 44 IU/L 15  13  17      08/05/12 AchR binding 33.80 (h), AchR blocking 36% (h), AchR modulating 38% (h), anti-striational (1:80 (h)  08/05/12 B12 643, TSH 4.07, FT4 1.1, BUN/Cr 21/1.7  08/06/12 CT chest - no thymoma, small nodules within the thyroid gland are likely benign, left lower lobe pulmonary nodule, Bosniak I cyst of the right kidney is partially imaged,  coronary artery calcifications.  08/06/12 EMG/NCS  1. Decremental response on repetitive nerve stimulation study of greater than 30%, consistent with neuromuscular junction disorder such as myasthenia gravis. 2. No evidence of underlying myopathy or motor neuropathy. 3. Decreased motor unit recruitment of the left flexor carpi radialis muscle may indicate underlying cervical radiculopathy (C6, C7), but not confirmed on paraspinal muscle testing or nerve conduction studies.  01/21/14 CBC (WBC 4.5, Plt 207, LFTs normal)  09/22/12 MRI brain No acute intracranial abnormality.  Sinusitis.    ASSESSMENT AND PLAN  82 y.o. year old male  has a past medical history of CPAP (continuous positive airway pressure) dependence, GERD (gastroesophageal reflux disease), Hypertension, Myasthenia gravis (HCC), Sleep apnea, and Squamous carcinoma. here with myasthenia gravis based on history and exam, EMG and lab testing. Did well with mestinon and prednisone until he developed steroid psychosis/mania. Now transitioned to imuran and doing well since July 2014.  Problem List: Myasthenia gravis - stable Steroid psychosis/mania - RESOLVED Thrombocytopenia - improved Bradycardia and irregular heart rate - RESOLVED  Dx:  MCI (mild cognitive impairment) with memory loss - Plan: ATN PROFILE, MR BRAIN WO CONTRAST     PLAN:  MEMORY LOSS (MCI vs mild dementia; since ~2022) - check MRI brain and ATN panel - consider memantine 10mg  at bedtime; increase to twice a day after 1-2 weeks - safety / supervision issues reviewed - daily physical activity / exercise (at least 15-30 minutes) - eat more plants / vegetables - increase social activities, brain stimulation, games, puzzles, hobbies, crafts, arts, music - aim  for at least 7-8 hours sleep per night (or more) - avoid smoking and alcohol - caution with medications, finances, driving; needs more supervision  MYASTHENIA GRAVIS (established, stable) - stable;  continue pyridostigmine 30mg  three times a day + imuran 100mg  daily - monitor labs (for imuran) q6 months  OSA on CPAP (established, stable) - stable; per Dr. Vickey Huger  Meds ordered this encounter  Medications   azaTHIOprine (IMURAN) 50 MG tablet    Sig: Take 2 tablets (100 mg total) by mouth daily.    Dispense:  180 tablet    Refill:  4   pyridostigmine (MESTINON) 60 MG tablet    Sig: Take 0.5 tablets (30 mg total) by mouth 3 (three) times daily.    Dispense:  150 tablet    Refill:  4   Orders Placed This Encounter  Procedures   MR BRAIN WO CONTRAST   ATN PROFILE   Return in about 1 year (around 01/14/2024).   I spent 45 minutes of face-to-face and non-face-to-face time with patient.  This included previsit chart review, lab review, study review, order entry, electronic health record documentation, patient education.     Suanne Marker, MD 01/14/2023, 9:49 AM Certified in Neurology, Neurophysiology and Neuroimaging  [facility] [address], [address] [place], [place] [address] [phone]

## 2023-01-17 LAB — ATN PROFILE
A -- Beta-amyloid 42/40 Ratio: 0.111 (ref >0.102)
Beta-amyloid 40: 251.04 pg/mL
Beta-amyloid 42: 27.9 pg/mL
N -- NfL, Plasma: 3.44 pg/mL (ref 0.00–11.55)
T -- p-tau181: 2.52 pg/mL — ABNORMAL HIGH (ref 0.00–0.97)

## 2023-01-18 ENCOUNTER — Ambulatory Visit
Admission: RE | Admit: 2023-01-18 | Discharge: 2023-01-18 | Disposition: A | Discharge status: Home / Self Care | Payer: Medicare Other | Source: Ambulatory Visit | Attending: Diagnostic Neuroimaging | Admitting: Diagnostic Neuroimaging

## 2023-01-18 DIAGNOSIS — G3184 Mild cognitive impairment, so stated: Secondary | ICD-10-CM | POA: Diagnosis not present

## 2023-01-28 ENCOUNTER — Other Ambulatory Visit: Payer: Self-pay | Admitting: Diagnostic Neuroimaging

## 2023-02-03 ENCOUNTER — Telehealth: Payer: Self-pay | Admitting: Diagnostic Neuroimaging

## 2023-02-03 NOTE — Telephone Encounter (Signed)
Pt is asking for a call with results to lab work done on 9-17 and would like a call with results to MRI of brain done on 09-21 once results have been gone over by MD

## 2023-02-04 NOTE — Telephone Encounter (Signed)
Called the patient and reviewed the lab results along with the MRI results. Pt has upcoming apt in march and advised at that time, Dr. Marjory Lies may decide to recheck the imaging to ensure there is no changes.Pt verbalized understanding. Pt had no questions at this time but was encouraged to call back if questions arise.

## 2023-02-04 NOTE — Telephone Encounter (Signed)
MRI of the brain: Mild dural thickening noted on MRI, which is nonspecific.  Will consider to repeat scan in 3 to 6 months to ensure stability.   Labs:  ATN panel shows elevated p Tau 181, but otherwise normal.  This is nonspecific but sometimes associated with brain neurodegeneration and other conditions which can affect her memory.  However Alzheimer's pathology is not confirmed.  Continue current plan as discussed. -VRP

## 2023-02-11 ENCOUNTER — Other Ambulatory Visit: Payer: Medicare Other

## 2023-02-17 ENCOUNTER — Ambulatory Visit: Payer: Medicare Other | Admitting: Diagnostic Neuroimaging

## 2023-05-27 NOTE — Progress Notes (Unsigned)
Marland Kitchen

## 2023-05-28 ENCOUNTER — Ambulatory Visit (INDEPENDENT_AMBULATORY_CARE_PROVIDER_SITE_OTHER): Payer: Medicare Other | Admitting: Neurology

## 2023-05-28 VITALS — BP 140/71 | HR 57 | Ht 69.5 in | Wt 193.0 lb

## 2023-05-28 DIAGNOSIS — G4739 Other sleep apnea: Secondary | ICD-10-CM

## 2023-05-28 DIAGNOSIS — G7000 Myasthenia gravis without (acute) exacerbation: Secondary | ICD-10-CM

## 2023-05-28 DIAGNOSIS — R001 Bradycardia, unspecified: Secondary | ICD-10-CM | POA: Diagnosis not present

## 2023-05-28 NOTE — Progress Notes (Signed)
SLEEP MEDICINE CLINIC    Provider:  Melvyn Novas, MD  Primary Care Physician:  Rodrigo Ran, MD [address] [place] [address]     Referring Provider: Rodrigo Ran, Md [address] [place],  [place] [address]          Chief Complaint according to patient   Patient presents with:     New Patient (Initial Visit)           HISTORY OF PRESENT ILLNESS:   Oscar Jacobs is a 83 y.o.  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 [name] presents today with some observations that he has made since early December early January.  He had been treated for complex sleep apnea in the past year had used CPAP compliantly for many years but experienced that when he could not use CPAP he suddenly felt that he slept quite well without it.  And that it may be time to reevaluate if his baseline is still requiring any airway pressure or if the apnea has become mild enough not to treat.  His last sleep study was from 2021 and it showed complex severe apnea not majority was obstructive sleep apnea the overall AHI in the lab was 34.6/h which is severe and was almost equally divided between central and obstructive events he was titrated to CPAP at 9 cm water pressure and was given an auto titration CPAP device.  When he saw the DME he was prescribed and if Voora large size nasal mask a fullface mask with a chinstrap.  His download between November and January of this year he used the machine again compliantly until 2 January and it was an auto titration device with a minimum pressure setting of 6 and a maximum of 15 cm water without expiratory relief on average he used it for 6 hours 40 minutes at night.  And his residual AHI was 5.3 the only thing I can see here is that there were quite a bit of air leaks.  But his 95th percentile pressure was 14 cm water and the residual apnea hypopnea index vary  greatly from day today but correlates to days of leak high air leak or low air leak.  Now being about 3 weeks without CPAP, he endorsed the Epworth Sleepiness Scale at 4 out of 24 points.  This is a very good baseline and his fatigue severity scale is 35 out of 63 and that is also a very normal baseline.  The geriatric depression scale was also provided and only 2 out of 15 questions here were endorsed positive.  So no indication for clinical depression.  Two times nocturia per night , the same as with CPAP>  No headaches recurring.    Chief concern according to patient :  I would love to be able to travel without CPAP in the future"    I have the pleasure of seeing Dr. Ronney Jacobs 05/28/23 a right-handed male with a History  of complex sleep apnea.  sleep disorder.      Sleep habits are as follows: The patient's dinner time is between 5-7 PM. The patient goes to bed at 10-11 PM and continues to sleep for 6-7 hours, wakes for 2 bathroom breaks, the first time at 2 AM.   The preferred sleep position is reclined , with the support of 1-2 pillows. He changed  to sleeping in a recliner   Dreams are reportedly infrequent - wife reports sleep talking.   The patient wakes up spontaneously. 6-7   AM is the usual rise time. He reports  feeling refreshed or restored in AM, with symptoms such as dry mouth,  no morning headaches, and residual fatigue.  Naps are taken rarely , and if he naps he takes a power nap.    Review of Systems: Out of a complete 14 system review, the patient complains of only the following symptoms, and all other reviewed systems are negative.:     How likely are you to doze in the following situations: 0 = not likely, 1 = slight chance, 2 = moderate chance, 3 = high chance   Sitting and Reading? Watching Television? Sitting inactive in a public place (theater or meeting)? As a passenger in a car for an hour without a break? Lying down in the afternoon when circumstances  permit? Sitting and talking to someone? Sitting quietly after lunch without alcohol? In a car, while stopped for a few minutes in traffic?   Total = 2/ 24 points   FSS endorsed at 35/ 63 points.   Social History   Socioeconomic History   Marital status: Married    Spouse name: [name] Caidyn Henricksen   Number of children: 3   Years of education: MD   Highest education level: Not on file  Occupational History   Occupation: Retired    Comment: Radiology MD  Tobacco Use   Smoking status: Never   Smokeless tobacco: Never  Substance and Sexual Activity   Alcohol use: Yes    Comment: seldom   Drug use: No   Sexual activity: Not on file  Other Topics Concern   Not on file  Social History Narrative   Patient is married ([name]) and lives at home with his wife.   Patient has three adult children.   Patient is retired, Marine scientist.   Married.  Ambulates unassisted.   Caffeine Use- 4 cups daily.   Patient is right-handed.   Patient has a college education.   Social Drivers of Corporate investment banker Strain: Not on file  Food Insecurity: Not on file  Transportation Needs: Not on file  Physical Activity: Not on file  Stress: Not on file  Social Connections: Not on file    Family History  Problem Relation Age of Onset   Heart disease Mother    Cancer - Other Father    Heart disease Sister    Heart disease Brother    Bipolar disorder Sister    Bipolar disorder Sister        suicide   Healthy Brother     Past Medical History:  Diagnosis Date   CPAP (continuous positive airway pressure) dependence    GERD (gastroesophageal reflux disease)    Hypertension    Myasthenia gravis (HCC)    Sleep apnea    Squamous carcinoma     Past Surgical History:  Procedure Laterality Date   COLONOSCOPY     Skin cancer removal     multiple     Current Outpatient Medications on File Prior to Visit  Medication Sig Dispense Refill   azaTHIOprine (IMURAN) 50 MG tablet TAKE 2  TABLETS BY MOUTH DAILY 200 tablet 2   calcium-vitamin D (OSCAL WITH D) 500-200 MG-UNIT per tablet Take 1 tablet by mouth 2 (two) times daily.     cholecalciferol (VITAMIN D) 1000 UNITS tablet Take 500 Units by  mouth daily.      co-enzyme Q-10 30 MG capsule Take 30 mg by mouth 3 (three) times daily.     Cyanocobalamin (VITAMIN B-12 CR PO) Take 1,250 mg by mouth daily.     Multiple Vitamin (MULTIVITAMIN WITH MINERALS) TABS Take 1 tablet by mouth daily.     pyridostigmine (MESTINON) 60 MG tablet Take 0.5 tablets (30 mg total) by mouth 3 (three) times daily. 150 tablet 4   ramipril (ALTACE) 10 MG capsule Take 10 mg by mouth daily.     rosuvastatin (CRESTOR) 20 MG tablet Take 20 mg by mouth daily.     No current facility-administered medications on file prior to visit.    Allergies  Allergen Reactions   Prednisone Other (See Comments)    manic   Penicillins Rash   Risperidone And Related Other (See Comments)    hyperactivity     DIAGNOSTIC DATA (LABS, IMAGING, TESTING) - I reviewed patient records, labs, notes, testing and imaging myself where available.  Lab Results  Component Value Date   WBC 4.6 04/07/2019   HGB 11.9 (L) 04/07/2019   HCT 33.9 (L) 04/07/2019   MCV 102 (H) 04/07/2019   PLT 123 (L) 04/07/2019      Component Value Date/Time   NA 140 02/23/2019 0911   K 5.1 02/23/2019 0911   CL 104 02/23/2019 0911   CO2 22 02/23/2019 0911   GLUCOSE 104 (H) 02/23/2019 0911   GLUCOSE 91 09/21/2012 0421   BUN 22 02/23/2019 0911   CREATININE 1.43 (H) 02/23/2019 0911   CALCIUM 9.4 02/23/2019 0911   PROT 7.0 02/23/2019 0911   ALBUMIN 4.5 02/23/2019 0911   AST 24 02/23/2019 0911   ALT 15 02/23/2019 0911   ALKPHOS 60 02/23/2019 0911   BILITOT 0.5 02/23/2019 0911   GFRNONAA 46 (L) 02/23/2019 0911   GFRAA 53 (L) 02/23/2019 0911   No results found for: "CHOL", "HDL", "LDLCALC", "LDLDIRECT", "TRIG", "CHOLHDL" No results found for: "HGBA1C" No results found for: "VITAMINB12" No  results found for: "TSH"  PHYSICAL EXAM:  Today's Vitals   05/28/23 1529  BP: (!) 140/71  Pulse: (!) 57  Weight: 193 lb (87.5 kg)  Height: 5' 9.5" (1.765 m)   Body mass index is 28.09 kg/m.   Wt Readings from Last 3 Encounters:  05/28/23 193 lb (87.5 kg)  01/14/23 198 lb 9.6 oz (90.1 kg)  11/01/22 204 lb 6.4 oz (92.7 kg)     Ht Readings from Last 3 Encounters:  05/28/23 5' 9.5" (1.765 m)  01/14/23 5\' 9"  (1.753 m)  11/01/22 5\' 9"  (1.753 m)      General: The patient is awake, alert and appears not in acute distress. The patient is well groomed. Head: Normocephalic, atraumatic. Neck is supple.  Mallampati 1-2,  neck circumference:16 inches . Nasal airflow not fully  patent.  Retrognathia is not seen.  Crowded teeth and irregular.  Dental status: biological  Cardiovascular:  Regular rate and cardiac rhythm by pulse,  without distended neck veins. Respiratory: Lungs are clear to auscultation.  Skin:  Without evidence of ankle edema, or rash. Trunk: The patient's posture is erect.   NEUROLOGIC EXAM: The patient is awake and alert, oriented to place and time.   Memory subjective described as intact.  Attention span & concentration ability appears normal.  Speech is fluent,  without  dysarthria, dysphonia or aphasia.  Mood and affect are appropriate.   Cranial nerves: no loss of smell or taste reported  Pupils are  equal and briskly reactive to light. Extraocular movements in vertical and horizontal planes were intact and without nystagmus. No Diplopia. Visual fields by finger perimetry are intact. Myasthenia manifestation of Ptosis on the left , "droopy eye ".   Hearing was intact to soft voice and finger rubbing.    Facial sensation intact to fine touch.  Facial motor strength is symmetric and tongue and uvula move midline.  Neck ROM : rotation, tilt and flexion extension were normal for age and shoulder shrug was symmetrical.    Motor exam:  Symmetric bulk, tone and ROM.    Normal tone without cog wheeling, symmetric grip strength .   Sensory:  Fine touch, pinprick and vibration were tested  and  normal.  Proprioception tested in the upper extremities was normal.  Gait and station: Patient could rise unassisted from a seated position, walked without assistive device.  Stance is of normal width/ base and the patient turned with 3-4 steps.  Toe and heel walk were deferred.  Deep tendon reflexes: in the  upper and lower extremities are symmetric and intact.      ASSESSMENT AND PLAN 83 y.o. year old male  here with:  History of sleep apnea with central and obstructive components.  History of sinus bradycardia, no CAD. He has myasthenia and manifested with ptosis, Dr Marjory Lies  follows.     1) Dr [name] has discovered that he sleeps just as well without CPAP since he moved to a comfortable recliner wide enough to allow lateral sleep.   2)  I will order a HST to be used while Dr [name]  sleeps in the recliner and without CPAP- if AHI is below 10/ h we will forgo PAP therapy.   3) I have also complimented Dr [name] on his weight loss and his level of daily physical activity.   I plan to follow up either personally or through our NP within 4-5  months o if positive for OSA, otherwise I can just inform him of the decision to d/c PAP therapy.   I would like to thank Rodrigo Ran, MD and Rodrigo Ran, Md [address] [place],  [place] [address] for allowing me to meet with and to take care of this pleasant patient.     After spending a total time of  30  minutes face to face and additional time for physical and neurologic examination, review of laboratory studies,  personal review of imaging studies, reports and results of other testing and review of referral information / records as far as provided in visit,   Electronically signed by: Melvyn Novas, MD 05/28/2023 3:41 PM  [facility] and [facility] Sleep Board certified by [employer]  of Sleep Medicine and Diplomate of the [employer] of Sleep Medicine. Board certified In Neurology through the ABPN, Fellow of the [employer] of Neurology.

## 2023-06-24 ENCOUNTER — Ambulatory Visit: Payer: Medicare Other | Admitting: Neurology

## 2023-06-24 DIAGNOSIS — G7000 Myasthenia gravis without (acute) exacerbation: Secondary | ICD-10-CM

## 2023-06-24 DIAGNOSIS — G4739 Other sleep apnea: Secondary | ICD-10-CM

## 2023-06-24 DIAGNOSIS — G4733 Obstructive sleep apnea (adult) (pediatric): Secondary | ICD-10-CM

## 2023-06-24 DIAGNOSIS — R001 Bradycardia, unspecified: Secondary | ICD-10-CM

## 2023-06-26 NOTE — Progress Notes (Signed)
 [facility]  Dr [name] , MD   83 year old male [birth year]   HOME SLEEP TEST REPORT ( by Watch PAT)   STUDY DATE:  06-24-2023   ORDERING CLINICIAN:  Melvyn Novas, MD  REFERRING CLINICIAN:  Dr Rodrigo Ran, MD   Primary neurologist :  Dr Joycelyn Schmid, MD    CLINICAL INFORMATION/HISTORY:  patient with myasthenia gravis, MCI and OSA. 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 [name] presents today with some observations that he has made since early December early January.   He had been treated for complex sleep apnea in the past year had used CPAP compliantly for many years but experienced that when he could not use CPAP he suddenly felt that he slept quite well without it.   And that it may be time to reevaluate if his baseline is still requiring any airway pressure or if the apnea has become mild enough not to treat.  His last sleep study was from 2021 and it showed complex severe apnea not majority was obstructive sleep apnea the overall AHI in the lab was 34.6/h which is severe and was almost equally divided between central and obstructive events he was titrated to CPAP at 9 cm water pressure and was given an auto titration CPAP device.  When he saw the DME he was prescribed and if Voora large size nasal mask a fullface mask with a chinstrap.  His download between November and January of this year he used the machine again compliantly until 2 January and it was an auto titration device with a minimum pressure setting of 6 and a maximum of 15 cm water without expiratory relief on average he used it for 6 hours 40 minutes at night.  And his residual AHI was 5.3 the only thing I can see here is that there were quite a bit of air leaks.  But his 95th percentile pressure was 14 cm  water and the residual apnea hypopnea index vary greatly from day today but correlates to days of leak high air leak or low air leak.   Epworth sleepiness score: Now being about 3 weeks without CPAP, he endorsed the Epworth Sleepiness Scale at 4 out of 24 points.  This is a very good baseline and his fatigue severity scale is 35 out of 63 and that is also a very normal baseline.  The geriatric depression scale was also provided and only 2 out of 15 questions here were endorsed positive.  So no indication for clinical depression.    I will order a HST to be used while Dr [name]  sleeps in the recliner and without CPAP- if AHI is below 10/ h we will forgo PAP therapy.    I have also complimented Dr [name] on his weight loss and his level of daily physical activity.    BMI: 28 kg/m   Neck Circumference: 16"    FINDINGS:   Sleep Summary:   Total Recording Time (hours, min): 9 h 21 m        Total Sleep Time (hours, min):      8 h 26 m           Percent REM (%):  19.5 %                                Respiratory Indices by CMS criteria :   Calculated pAHI (per hour):    3.8/h                          REM pAHI:     6.6/h                                             NREM pAHI:      3.5/h                         Positional AHI:         2.9/h in supine sleep.        Snoring was present for 20% of recorded sleep time and reached a mean volume of 41 dB.                                      Oxygen Saturation Statistics:   Oxygen Saturation (%) Mean:    95%               O2 Saturation Range (%):          between 77% and 99%.                              O2 Saturation (minutes) <89%:   0 m        Pulse Rate Statistics:   Pulse Mean (bpm):     54 bpm             Pulse Range:      bradycardic from 44 bpm - through 85 bpm.            IMPRESSION:  This HST did not confirm sleep apnea to be present, following CMS scoring criteria.    RECOMMENDATION: CPAP therapy will be  discontinued.     INTERPRETING PHYSICIAN:   Melvyn Novas, MD  [facility] and [facility] Sleep Board certified by [employer] of Sleep Medicine and Diplomate of the [employer] of Sleep Medicine. Board certified In Neurology through the ABPN, Fellow of the [employer] of Neurology.

## 2023-06-30 ENCOUNTER — Encounter: Payer: Self-pay | Admitting: Neurology

## 2023-06-30 NOTE — Addendum Note (Signed)
 Addended by: Melvyn Novas on: 06/30/2023 06:17 PM   Modules accepted: Orders

## 2023-06-30 NOTE — Procedures (Signed)
 [facility]  Dr [name] , MD   83 year old male [birth year]   HOME SLEEP TEST REPORT ( by Watch PAT)   STUDY DATE:  06-24-2023   ORDERING CLINICIAN:  Melvyn Novas, MD  REFERRING CLINICIAN:  Dr Rodrigo Ran, MD   Primary neurologist :  Dr Joycelyn Schmid, MD    CLINICAL INFORMATION/HISTORY:  patient with myasthenia gravis, MCI and OSA. 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 [name] presents today with some observations that he has made since early December early January.   He had been treated for complex sleep apnea in the past year had used CPAP compliantly for many years but experienced that when he could not use CPAP he suddenly felt that he slept quite well without it.   And that it may be time to reevaluate if his baseline is still requiring any airway pressure or if the apnea has become mild enough not to treat.  His last sleep study was from 2021 and it showed complex severe apnea not majority was obstructive sleep apnea the overall AHI in the lab was 34.6/h which is severe and was almost equally divided between central and obstructive events he was titrated to CPAP at 9 cm water pressure and was given an auto titration CPAP device.  When he saw the DME he was prescribed and if Voora large size nasal mask a fullface mask with a chinstrap.  His download between November and January of this year he used the machine again compliantly until 2 January and it was an auto titration device with a minimum pressure setting of 6 and a maximum of 15 cm water without expiratory relief on average he used it for 6 hours 40 minutes at night.  And his residual AHI was 5.3 the only thing I can see here is that there were quite a bit of air leaks.  But his 95th percentile pressure was 14 cm  water and the residual apnea hypopnea index vary greatly from day today but correlates to days of leak high air leak or low air leak.   Epworth sleepiness score: Now being about 3 weeks without CPAP, he endorsed the Epworth Sleepiness Scale at 4 out of 24 points.  This is a very good baseline and his fatigue severity scale is 35 out of 63 and that is also a very normal baseline.  The geriatric depression scale was also provided and only 2 out of 15 questions here were endorsed positive.  So no indication for clinical depression.    I will order a HST to be used while Dr [name]  sleeps in the recliner and without CPAP- if AHI is below 10/ h we will forgo PAP therapy.    I have also complimented Dr [name] on his weight loss and his level of daily physical activity.    BMI: 28 kg/m   Neck Circumference: 16"    FINDINGS:   Sleep Summary:   Total Recording Time (hours, min): 9 h 21 m        Total Sleep Time (hours, min):      8 h 26 m           Percent REM (%):  19.5 %                                Respiratory Indices by CMS criteria :   Calculated pAHI (per hour):    3.8/h                          REM pAHI:     6.6/h                                             NREM pAHI:      3.5/h                         Positional AHI:         2.9/h in supine sleep.        Snoring was present for 20% of recorded sleep time and reached a mean volume of 41 dB.                                      Oxygen Saturation Statistics:   Oxygen Saturation (%) Mean:    95%               O2 Saturation Range (%):          between 77% and 99%.                              O2 Saturation (minutes) <89%:   0 m        Pulse Rate Statistics:   Pulse Mean (bpm):     54 bpm             Pulse Range:      bradycardic from 44 bpm - through 85 bpm.            IMPRESSION:  This HST did not confirm sleep apnea to be present, following CMS scoring criteria.    RECOMMENDATION: CPAP therapy will be  discontinued.     INTERPRETING PHYSICIAN:   Melvyn Novas, MD  [facility] and [facility] Sleep Board certified by [employer] of Sleep Medicine and Diplomate of the [employer] of Sleep Medicine. Board certified In Neurology through the ABPN, Fellow of the [employer] of Neu[employer].

## 2023-07-01 NOTE — Telephone Encounter (Addendum)
 Bobbye Morton, CMA  [facility] Parents Wyvonnia Dusky, Ileene Patrick; Angus Seller, Clovis Riley New d/c orders have been placed for the above pt, DOB: 2040/07/09 Thanks  New, Maryruth Bun, Abbe Amsterdam, CMA; Julius Bowels; Kathe Becton; 1 other Received, thank you!

## 2023-07-01 NOTE — Telephone Encounter (Signed)
 Contacted pt, informed him SSR show no sleep apnea to be present.  This would explain the  symptoms Dr [name] endorsed when using CPAP - headaches and actually poorer sleep quality.  MD will d/c CPAP orders.  Patient can follow up with his primary neurologist for MG. He verbally understood and was appreciative.

## 2023-07-15 ENCOUNTER — Ambulatory Visit: Payer: Medicare Other | Admitting: Diagnostic Neuroimaging

## 2023-07-15 ENCOUNTER — Encounter: Payer: Self-pay | Admitting: Diagnostic Neuroimaging

## 2023-07-15 VITALS — BP 128/60 | HR 71 | Ht 69.0 in | Wt 198.6 lb

## 2023-07-15 DIAGNOSIS — G3184 Mild cognitive impairment, so stated: Secondary | ICD-10-CM | POA: Diagnosis not present

## 2023-07-15 DIAGNOSIS — G7000 Myasthenia gravis without (acute) exacerbation: Secondary | ICD-10-CM

## 2023-07-15 MED ORDER — PYRIDOSTIGMINE BROMIDE 60 MG PO TABS: 30.0000 mg | ORAL_TABLET | Freq: Three times a day (TID) | ORAL | 4 refills | Status: DC

## 2023-07-15 MED ORDER — AZATHIOPRINE 50 MG PO TABS: 100.0000 mg | ORAL_TABLET | Freq: Every day | ORAL | 4 refills | Status: DC

## 2023-07-15 NOTE — Progress Notes (Signed)
 [facility]  PATIENT: Oscar Jacobs DOB: 1940-12-04  REFERRING CLINICIAN: Rodrigo Ran, MD  HISTORY FROM: patient and wife REASON FOR VISIT: follow up   HISTORICAL  CHIEF COMPLAINT:  Chief Complaint  Patient presents with   Follow-up    Pt in room 7. Wife and daughter in room. Here for Myasthenia gravis follow. Pt said left eye doesn't open wide as the right eye. Pt said memory is stable has not worsened.    HISTORY OF PRESENT ILLNESS:   UPDATE (07/15/23, VRP): Since last visit, doing well. Symptoms are stable. No alleviating or aggravating factors. Tolerating meds.    UPDATE (01/14/23, VRP): Since last visit, MG stable. However, here for memory loss eval. Wife noted mild sxs x 2 years. Short term recall, navigation, recent events, remembering names and financial transactions have been more difficult and causing some marital strain. Able to maintain personal hygiene, bathing, dressing. Able to drive short distances. Still manages some of his own bank accounts.   UPDATE (02/18/22, VRP): Since last visit, doing well. Symptoms are stable. Had mild COVID in Sept 2023. No other issues.   UPDATE (02/12/21, VRP): Since last visit, doing well. Symptoms are stable. Tolerating mestinon and imuran. Labs stable.   UPDATE (06/12/20, VRP): Since last visit, doing well. Symptoms are stable from MG standpoint. No alleviating or aggravating factors. Tolerating mestinon and imuran.    UPDATE (10/11/19, VRP): Since last visit, doing well. Symptoms are stable. Imuran 100mg  daily due to low plts; now 106 in Feb 2021.    UPDATE (11/25/17, VRP): Since last visit, doing well. Symptoms are mild. Severity is mild. No alleviating or aggravating factors. Tolerating meds. Has had new PSA elevation, and biopsy (low grade --> monitoring for now).   UPDATE 12/18/16: Since last visit, doing well. Tolerating meds. Not SOB, ptosis or generalized weakness.  UPDATE 02/09/16: Since last visit, doing  well. No new symptoms. Tolerating medications. Staying active with yard work.   UPDATE 08/09/15: Since last visit, doing well. Cardiology consult and workup completed and pt reassured. MG is stable. Tolerating meds.   UPDATE 02/08/15: Since last visit, doing well. No new events. Tolerating imuran and mestinon. Today has some low heart rate (not normal) and irregular rhythm. No chest pain or shortness of breath. Family hx of cardiac arrhythmia in brother and brother's son.   UPDATE 08/09/14: Since last visit, doing well. No new sxs. Stable on imuran and mestinon. No weakness or SOB.   UPDATE 02/07/14: Since last visit, MG is stable. No SOB, weakness, slurred speech or trouble swallowing. Has skin CA removed on leg (recurrent from 3 years ago). Has annual dermatology eval. Tolerating imuran 150mg  daily + mestinon 30mg  TID. Doing better with OSA and CPAP.  UPDATE 04/09/13: Continues to do well on imuran 150mg  daily. No side effects. Has been to PCP (Dr. Waynard Edwards) for annual physical; PSA slightly elevated and has follow up. Patient has gained some weight (he thinks due to inactivity). Otherwise no concerns. No SOB, weakness, numbness.  UPDATE 12/29/12: Doing well on imuran 150mg  daily. No new sxs. No SOB. No weakness. Working in the yard.  UPDATE 11/06/12: Doing well on imuran 100mg  daily. No MG flares. Left ptosis is better. No mania or mood disturbances. Last labs were improved. Taking mestinon 30mg  TID. Sometimes has diarrhea, and then takes 15mg  dose.  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 He was kindly and expertly managed by medical and  neurohospitalists, and tapered off prednisone, treated with IVIG, and then started on imuran. Doing well now. Mania is almost fully resolved. Still a bit "hyper" per wife, but much improved. Platelets trended down near end of hospital stay (last 120). Here for follow up labs as  well.  UPDATE 09/10/12: Since last visit, doing much better on prednisone. Now on prednisone 60mg  daily. Taking calcium + Vit D, omeprazole. No side effects, except for slightly "buzzy" mood. Sleep apnea better. Strength improved. Almost back to normal. Some left ptosis persists. Some chewing difficulty towards end of meals.  UPDATE 08/13/12: Since last visit, EMG and lab testing confirm myasthenia gravis diagnosis. I prescribed Mestinon for patient. He has had a positive response. He tried 30 mg 3 times a day, but now takes 15 mg 6 times a day, do to see increasing secretions and diarrhea. Medications lasting approximately 4 hours. No shortness of breath. Continues to have left ptosis, some neck weakness and left arm weakness. Some mild slurred speech. No trouble swallowing.  PRIOR HPI (08/06/12): 83 year old right-handed male with history of hypertension, seasonal allergies, here for evaluation of possible myasthenia gravis. 6 weeks ago patient noted left eyelid drooping. No double vision. Over the next few weeks he had increasing weakness in keeping his head upright as well as difficulty with talking and chewing. Since this past Saturday symptoms have been significantly worse. He denies any shortness of breath or choking problems. He has mild weakness in his arms and no weakness in his legs. No headache, confusion, chest pain. No similar symptoms in the past. Patient does have diagnosis of sleep apnea, but he has not used his CPAP machine last 2 or 3 years.   REVIEW OF SYSTEMS: Full 14 system review of systems performed and negative: as per HPI.    ALLERGIES: Allergies  Allergen Reactions   Prednisone Other (See Comments)    manic   Penicillins Rash   Risperidone And Related Other (See Comments)    hyperactivity    HOME MEDICATIONS:  Outpatient Encounter Medications as of 07/15/2023  Medication Sig   calcium-vitamin D (OSCAL WITH D) 500-200 MG-UNIT per tablet Take 1 tablet by mouth 2 (two)  times daily.   cholecalciferol (VITAMIN D) 1000 UNITS tablet Take 500 Units by mouth daily.    co-enzyme Q-10 30 MG capsule Take 30 mg by mouth 3 (three) times daily.   Cyanocobalamin (VITAMIN B-12 CR PO) Take 1,250 mg by mouth daily.   Multiple Vitamin (MULTIVITAMIN WITH MINERALS) TABS Take 1 tablet by mouth daily.   ramipril (ALTACE) 10 MG capsule Take 10 mg by mouth daily.   rosuvastatin (CRESTOR) 20 MG tablet Take 20 mg by mouth daily.   [DISCONTINUED] azaTHIOprine (IMURAN) 50 MG tablet TAKE 2 TABLETS BY MOUTH DAILY   [DISCONTINUED] pyridostigmine (MESTINON) 60 MG tablet Take 0.5 tablets (30 mg total) by mouth 3 (three) times daily.   azaTHIOprine (IMURAN) 50 MG tablet Take 2 tablets (100 mg total) by mouth daily.   pyridostigmine (MESTINON) 60 MG tablet Take 0.5 tablets (30 mg total) by mouth 3 (three) times daily.   No facility-administered encounter medications on file as of 07/15/2023.     PAST MEDICAL HISTORY: Past Medical History:  Diagnosis Date   CPAP (continuous positive airway pressure) dependence    GERD (gastroesophageal reflux disease)    Hypertension    Myasthenia gravis (HCC)    Sleep apnea    Squamous carcinoma     PAST SURGICAL HISTORY: Past Surgical History:  Procedure Laterality Date   COLONOSCOPY     Skin cancer removal     multiple    FAMILY HISTORY: Family History  Problem Relation Age of Onset   Heart disease Mother    Cancer - Other Father    Heart disease Sister    Heart disease Brother    Bipolar disorder Sister    Bipolar disorder Sister        suicide   Healthy Brother     SOCIAL HISTORY:  Social History   Socioeconomic History   Marital status: Married    Spouse name: [name] Doug Bucklin   Number of children: 3   Years of education: MD   Highest education level: Not on file  Occupational History   Occupation: Retired    Comment: Radiology MD  Tobacco Use   Smoking status: Never   Smokeless tobacco: Never  Substance and  Sexual Activity   Alcohol use: Yes    Comment: seldom   Drug use: No   Sexual activity: Not on file  Other Topics Concern   Not on file  Social History Narrative   Patient is married ([name]) and lives at home with his wife.   Patient has three adult children.   Patient is retired, Marine scientist.   Married.  Ambulates unassisted.   Caffeine Use- 4 cups daily.   Patient is right-handed.   Patient has a college education.   Social Drivers of Corporate investment banker Strain: Not on file  Food Insecurity: Not on file  Transportation Needs: Not on file  Physical Activity: Not on file  Stress: Not on file  Social Connections: Not on file  Intimate Partner Violence: Not on file     PHYSICAL EXAM  GENERAL EXAM/CONSTITUTIONAL: Vitals:  Vitals:   07/15/23 1352  BP: 128/60  Pulse: 71  Weight: 198 lb 9.6 oz (90.1 kg)  Height: 5\' 9"  (1.753 m)    Body mass index is 29.33 kg/m. Wt Readings from Last 3 Encounters:  07/15/23 198 lb 9.6 oz (90.1 kg)  05/28/23 193 lb (87.5 kg)  01/14/23 198 lb 9.6 oz (90.1 kg)   Patient is in no distress; well developed, nourished and groomed; neck is supple  CARDIOVASCULAR: Examination of carotid arteries is normal; no carotid bruits Regular rate and rhythm, no murmurs Examination of peripheral vascular system by observation and palpation is normal  EYES: Ophthalmoscopic exam of optic discs and posterior segments is normal; no papilledema or hemorrhages No results found.  MUSCULOSKELETAL: Gait, strength, tone, movements noted in Neurologic exam below  NEUROLOGIC: MENTAL STATUS:     01/14/2023    8:20 AM  MMSE - Mini Mental State Exam  Orientation to time 5  Orientation to Place 5  Registration 3  Attention/ Calculation 1  Recall 0  Language- name 2 objects 2  Language- repeat 1  Language- follow 3 step command 3  Language- read & follow direction 1  Write a sentence 1  Copy design 1  Total score 23   awake, alert,  oriented to person, place and time recent and remote memory intact normal attention and concentration language fluent, comprehension intact, naming intact fund of knowledge appropriate  CRANIAL NERVE:  2nd, 3rd, 4th, 6th - pupils equal and reactive to light, visual fields full to confrontation, extraocular muscles intact, no nystagmus --> EXCEPT MILD LEFT PTOSIS 5th - facial sensation symmetric 7th - facial strength symmetric 8th - hearing intact 9th - palate elevates symmetrically, uvula midline 11th -  shoulder shrug symmetric 12th - tongue protrusion midline  MOTOR:  normal bulk and tone, full strength in the BUE, BLE  SENSORY:  normal and symmetric to light touch  COORDINATION:  finger-nose-finger, fine finger movements normal  REFLEXES:  deep tendon reflexes TRACE and symmetric  GAIT/STATION:  narrow based gait     DIAGNOSTIC DATA (LABS, IMAGING, TESTING) - I reviewed patient records, labs, notes, testing and imaging myself where available.  Lab Results  Component Value Date   WBC 4.6 04/07/2019   HGB 11.9 (L) 04/07/2019   HCT 33.9 (L) 04/07/2019   MCV 102 (H) 04/07/2019   PLT 123 (L) 04/07/2019      Latest Ref Rng & Units 02/23/2019    9:11 AM 08/01/2015   10:03 AM 08/03/2014   10:56 AM  CMP  Glucose 65 - 99 mg/dL 295  93  92   BUN 8 - 27 mg/dL 22  28  22    Creatinine 0.76 - 1.27 mg/dL 6.21  3.08  6.57   Sodium 134 - 144 mmol/L 140  141  140   Potassium 3.5 - 5.2 mmol/L 5.1  4.6  4.8   Chloride 96 - 106 mmol/L 104  101  102   CO2 20 - 29 mmol/L 22  23  22    Calcium 8.6 - 10.2 mg/dL 9.4  9.6  9.1   Total Protein 6.0 - 8.5 g/dL 7.0  6.7  6.9   Total Bilirubin 0.0 - 1.2 mg/dL 0.5  0.7  0.6   Alkaline Phos 39 - 117 IU/L 60  50  50   AST 0 - 40 IU/L 24  24  20    ALT 0 - 44 IU/L 15  13  17      08/05/12 AchR binding 33.80 (h), AchR blocking 36% (h), AchR modulating 38% (h), anti-striational (1:80 (h)  08/05/12 B12 643, TSH 4.07, FT4 1.1, BUN/Cr  21/1.7  08/06/12 CT chest - no thymoma, small nodules within the thyroid gland are likely benign, left lower lobe pulmonary nodule, Bosniak I cyst of the right kidney is partially imaged, coronary artery calcifications.  08/06/12 EMG/NCS  1. Decremental response on repetitive nerve stimulation study of greater than 30%, consistent with neuromuscular junction disorder such as myasthenia gravis. 2. No evidence of underlying myopathy or motor neuropathy. 3. Decreased motor unit recruitment of the left flexor carpi radialis muscle may indicate underlying cervical radiculopathy (C6, C7), but not confirmed on paraspinal muscle testing or nerve conduction studies.  01/21/14 CBC (WBC 4.5, Plt 207, LFTs normal)  09/22/12 MRI brain No acute intracranial abnormality.  Sinusitis.   01/18/23 MRI brain (without) demonstrating: -Diffuse pachymeningeal thickening measuring up to 4 mm in thickness.  4 to 5 mm of cerebellar tonsillar ectopia.  May be related to intracranial hypotension.  Other causes such as autoimmune, inflammatory or infectious etiologies are less likely. -Chronic pansinusitis changes. -Remainder of brain parenchyma is unremarkable.  01/14/23 ATN panel Component Ref Range & Units (hover) 6 mo ago  A -- Beta-amyloid 42/40 Ratio 0.111  Beta-amyloid 42 27.90  Beta-amyloid 40 251.04  T -- p-tau181 2.52 High   N -- NfL, Plasma 3.44  ATN SUMMARY Comment  Comment:                        A- T+ N- A high pTau181 concentration was observed. A normal beta-amyloid 42/40 ratio and normal concentration of NfL were observed at this time. These results are not consistent with  the presence of Alzheimer's- related pathology, but may indicate pathology of another condition.     ASSESSMENT AND PLAN  83 y.o. year old male  has a past medical history of CPAP (continuous positive airway pressure) dependence, GERD (gastroesophageal reflux disease), Hypertension, Myasthenia gravis (HCC), Sleep apnea, and  Squamous carcinoma. here with myasthenia gravis based on history and exam, EMG and lab testing. Did well with mestinon and prednisone until he developed steroid psychosis/mania. Now transitioned to imuran and doing well since July 2014.  Problem List: Myasthenia gravis - stable Steroid psychosis/mania - RESOLVED Thrombocytopenia - improved Bradycardia and irregular heart rate - RESOLVED  Dx:  No diagnosis found.     PLAN:  MEMORY LOSS (MCI vs mild dementia; since ~2022) - consider memantine 10mg  at bedtime; increase to twice a day after 1-2 weeks - safety / supervision issues reviewed - daily physical activity / exercise (at least 15-30 minutes) - eat more plants / vegetables - increase social activities, brain stimulation, games, puzzles, hobbies, crafts, arts, music - aim for at least 7-8 hours sleep per night (or more) - avoid smoking and alcohol - caution with medications, finances, driving; needs more supervision  MYASTHENIA GRAVIS (established, stable) - stable; continue pyridostigmine 30mg  three times a day + imuran 100mg  daily - monitor labs per PCP (for imuran) q6 months  OSA --> resolved  - stable; per Dr. Vickey Huger  Meds ordered this encounter  Medications   pyridostigmine (MESTINON) 60 MG tablet    Sig: Take 0.5 tablets (30 mg total) by mouth 3 (three) times daily.    Dispense:  150 tablet    Refill:  4   azaTHIOprine (IMURAN) 50 MG tablet    Sig: Take 2 tablets (100 mg total) by mouth daily.    Dispense:  200 tablet    Refill:  4   Return in about 6 months (around 01/15/2024).     Suanne Marker, MD 07/15/2023, 2:21 PM Certified in Neurology, Neurophysiology and Neuroimaging  [facility] [address] [place], [place] [address] [phone]

## 2023-07-25 ENCOUNTER — Encounter (HOSPITAL_BASED_OUTPATIENT_CLINIC_OR_DEPARTMENT_OTHER): Payer: Self-pay | Admitting: Emergency Medicine

## 2023-07-25 ENCOUNTER — Emergency Department (HOSPITAL_BASED_OUTPATIENT_CLINIC_OR_DEPARTMENT_OTHER): Admission: EM | Admit: 2023-07-25 | Discharge: 2023-07-25 | Disposition: A | Discharge status: Home / Self Care

## 2023-07-25 ENCOUNTER — Other Ambulatory Visit: Payer: Self-pay

## 2023-07-25 DIAGNOSIS — R220 Localized swelling, mass and lump, head: Secondary | ICD-10-CM | POA: Diagnosis present

## 2023-07-25 DIAGNOSIS — K047 Periapical abscess without sinus: Secondary | ICD-10-CM | POA: Diagnosis not present

## 2023-07-25 HISTORY — DX: Malignant neoplasm of prostate: C61

## 2023-07-25 MED ORDER — CLINDAMYCIN HCL 300 MG PO CAPS: 300.0000 mg | ORAL_CAPSULE | Freq: Four times a day (QID) | ORAL | 0 refills | Status: DC

## 2023-07-25 MED ORDER — CLINDAMYCIN HCL 150 MG PO CAPS
300.0000 mg | ORAL_CAPSULE | Freq: Once | ORAL | Status: AC
  Administered 2023-07-25: 300 mg via ORAL
  Filled 2023-07-25: qty 2

## 2023-07-25 NOTE — Discharge Instructions (Signed)
 Please take the clindamycin as prescribed.  You may call the oral surgeons at the number provided to see who can get you in.  If you have worsening swelling or any difficulty breathing or swallowing please return to the emergency department for reevaluation.

## 2023-07-25 NOTE — ED Triage Notes (Signed)
 Pt via pov from home with left sided facial swelling today. Pt reports that he has some issues with teeth on his left side and is working through those issues with his dentist. Pt denies any difficulty breathing or swallowing or pain; his wife was concerned for his asymmetry today. Pt has myasthenia gravis. Pt alert & oriented, nad noted.

## 2023-07-25 NOTE — ED Provider Notes (Signed)
 [place] EMERGENCY DEPARTMENT AT [facility] Provider Note   CSN: [id number] Arrival date & time: 07/25/23  1140     History  Chief Complaint  Patient presents with   Facial Swelling    Oscar Jacobs is a 83 y.o. male.  83 year old male with past medical history of myasthenia gravis and hyperlipidemia presenting to the emergency department today with left-sided facial swelling.  The patient saw his dentist a few weeks ago and was told that he would likely need his left upper and lower molar pulled.  They did discuss possible referral to an oral surgeon.  He was not referred at the time but plan was for this in the future potentially.  The patient started noticing some swelling on the left side of his face yesterday evening.  This was worse this morning.  The patient denies any difficulty breathing or swallowing.  He came to the emergency department today for further evaluation regarding this due to these ongoing symptoms.        Home Medications Prior to Admission medications   Medication Sig Start Date End Date Taking? Authorizing Provider  clindamycin (CLEOCIN) 300 MG capsule Take 1 capsule (300 mg total) by mouth 4 (four) times daily. 07/25/23  Yes Durwin Glaze, MD  azaTHIOprine (IMURAN) 50 MG tablet Take 2 tablets (100 mg total) by mouth daily. 07/15/23 11/26/24  Penumalli, Glenford Bayley, MD  calcium-vitamin D (OSCAL WITH D) 500-200 MG-UNIT per tablet Take 1 tablet by mouth 2 (two) times daily.    [provider]  cholecalciferol (VITAMIN D) 1000 UNITS tablet Take 500 Units by mouth daily.     [provider]  co-enzyme Q-10 30 MG capsule Take 30 mg by mouth 3 (three) times daily.    [provider]  Cyanocobalamin (VITAMIN B-12 CR PO) Take 1,250 mg by mouth daily.    [provider]  Multiple Vitamin (MULTIVITAMIN WITH MINERALS) TABS Take 1 tablet by mouth daily.    [provider]  pyridostigmine (MESTINON) 60 MG tablet Take 0.5  tablets (30 mg total) by mouth 3 (three) times daily. 07/15/23 11/26/24  Penumalli, Glenford Bayley, MD  ramipril (ALTACE) 10 MG capsule Take 10 mg by mouth daily.    [provider]  rosuvastatin (CRESTOR) 20 MG tablet Take 20 mg by mouth daily. 08/03/19   [provider]      Allergies    Prednisone, Penicillins, and Risperidone and related    Review of Systems   Review of Systems  HENT:  Positive for dental problem.   All other systems reviewed and are negative.   Physical Exam Updated Vital Signs BP 137/67   Pulse 62   Temp 98.1 F (36.7 C)   Resp 18   Ht 5\' 9"  (1.753 m)   Wt 90.1 kg   SpO2 97%   BMI 29.33 kg/m  Physical Exam Vitals and nursing note reviewed.   Gen: NAD Eyes: PERRL, EOMI HEENT: gingival swelling noted around the upper and lower molars on the left side, he does not have any significant swelling of the posterior oropharynx, there is no trismus, no significant submandibular swelling is noted, there is mild swelling over the left maxillofacial region Neck: trachea midline Resp: clear to auscultation bilaterally Card: RRR, no murmurs, rubs, or gallops Abd: nontender, nondistended Extremities: no calf tenderness, no edema Skin: no rashes Psyc: acting appropriately   ED Results / Procedures / Treatments   Labs (all labs ordered are listed, but only abnormal  results are displayed) Labs Reviewed - No data to display  EKG None  Radiology No results found.  Procedures Procedures    Medications Ordered in ED Medications  clindamycin (CLEOCIN) capsule 300 mg (has no administration in time range)    ED Course/ Medical Decision Making/ A&P                                 Medical Decision Making 83 year old male presenting to the emergency department today with left-sided facial swelling.  I do think this is likely secondary to his known odontogenic issues.  The patient has no trismus or no significant swelling of the submandibular region  so suspicion for deep space soft tissue infection is low at this time.  The patient does have a penicillin allergy.  I will cover him with clindamycin for dental infection which should also cover for any preseptal cellulitis as well.  I think that he is stable for follow-up with oral surgery.  I will give him a referral.  At this time symptoms do seem relatively mild so we will hold off on any imaging at this time.  Risk Prescription drug management.           Final Clinical Impression(s) / ED Diagnoses Final diagnoses:  Dental infection    Rx / DC Orders ED Discharge Orders          Ordered    clindamycin (CLEOCIN) 300 MG capsule  4 times daily        07/25/23 1416              Durwin Glaze, MD 07/25/23 1416

## 2023-07-25 NOTE — ED Notes (Signed)
 Reviewed AVS/discharge instruction with patient. Time allotted for and all questions answered. Patient is agreeable for d/c and escorted to ed exit by staff.

## 2023-09-18 ENCOUNTER — Encounter: Payer: Self-pay | Admitting: Cardiovascular Disease

## 2023-11-14 ENCOUNTER — Ambulatory Visit (HOSPITAL_COMMUNITY)
Admission: RE | Admit: 2023-11-14 | Discharge: 2023-11-14 | Disposition: A | Discharge status: Home / Self Care | Source: Ambulatory Visit | Attending: Cardiovascular Disease | Admitting: Cardiovascular Disease

## 2023-11-14 DIAGNOSIS — R0989 Other specified symptoms and signs involving the circulatory and respiratory systems: Secondary | ICD-10-CM | POA: Diagnosis present

## 2023-11-16 ENCOUNTER — Ambulatory Visit: Payer: Self-pay | Admitting: Cardiovascular Disease

## 2023-11-16 DIAGNOSIS — I6522 Occlusion and stenosis of left carotid artery: Secondary | ICD-10-CM

## 2024-01-26 ENCOUNTER — Ambulatory Visit: Admitting: Diagnostic Neuroimaging

## 2024-01-26 ENCOUNTER — Encounter: Payer: Self-pay | Admitting: Diagnostic Neuroimaging

## 2024-01-26 VITALS — BP 133/66 | HR 60 | Ht 69.0 in | Wt 190.0 lb

## 2024-01-26 DIAGNOSIS — G7000 Myasthenia gravis without (acute) exacerbation: Secondary | ICD-10-CM | POA: Diagnosis not present

## 2024-01-26 DIAGNOSIS — G3184 Mild cognitive impairment, so stated: Secondary | ICD-10-CM

## 2024-01-26 MED ORDER — PYRIDOSTIGMINE BROMIDE 60 MG PO TABS: 30.0000 mg | ORAL_TABLET | Freq: Three times a day (TID) | ORAL | 4 refills | Status: AC

## 2024-01-26 MED ORDER — AZATHIOPRINE 50 MG PO TABS: 100.0000 mg | ORAL_TABLET | Freq: Every day | ORAL | 4 refills | Status: AC

## 2024-01-26 NOTE — Progress Notes (Signed)
 [facility]  PATIENT: Oscar Jacobs DOB: [birth year]  REFERRING CLINICIAN: Shayne Anes, MD  HISTORY FROM: patient and wife REASON FOR VISIT: follow up   HISTORICAL  CHIEF COMPLAINT:  Chief Complaint  Patient presents with   MG    RM 7 alone Pt is well and stable, reports no new MG concerns.     HISTORY OF PRESENT ILLNESS:   UPDATE (01/26/24, VRP): Since last visit, doing well and tolerating meds. Wife and daughters visiting [place] now. Patient getting help from some other family now.   UPDATE (07/15/23, VRP): Since last visit, doing well. Symptoms are stable. No alleviating or aggravating factors. Tolerating meds.    UPDATE (01/14/23, VRP): Since last visit, MG stable. However, here for memory loss eval. Wife noted mild sxs x 2 years. Short term recall, navigation, recent events, remembering names and financial transactions have been more difficult and causing some marital strain. Able to maintain personal hygiene, bathing, dressing. Able to drive short distances. Still manages some of his own bank accounts.   UPDATE (02/18/22, VRP): Since last visit, doing well. Symptoms are stable. Had mild COVID in Sept 2023. No other issues.   UPDATE (02/12/21, VRP): Since last visit, doing well. Symptoms are stable. Tolerating mestinon  and imuran . Labs stable.   UPDATE (06/12/20, VRP): Since last visit, doing well. Symptoms are stable from MG standpoint. No alleviating or aggravating factors. Tolerating mestinon  and imuran .    UPDATE (10/11/19, VRP): Since last visit, doing well. Symptoms are stable. Imuran  100mg  daily due to low plts; now 106 in Feb 2021.    UPDATE (11/25/17, VRP): Since last visit, doing well. Symptoms are mild. Severity is mild. No alleviating or aggravating factors. Tolerating meds. Has had new PSA elevation, and biopsy (low grade --> monitoring for now).   UPDATE 12/18/16: Since last visit, doing well. Tolerating meds. Not SOB, ptosis or generalized  weakness.  UPDATE 02/09/16: Since last visit, doing well. No new symptoms. Tolerating medications. Staying active with yard work.   UPDATE 08/09/15: Since last visit, doing well. Cardiology consult and workup completed and pt reassured. MG is stable. Tolerating meds.   UPDATE 02/08/15: Since last visit, doing well. No new events. Tolerating imuran  and mestinon . Today has some low heart rate (not normal) and irregular rhythm. No chest pain or shortness of breath. Family hx of cardiac arrhythmia in brother and brother's son.   UPDATE 08/09/14: Since last visit, doing well. No new sxs. Stable on imuran  and mestinon . No weakness or SOB.   UPDATE 02/07/14: Since last visit, MG is stable. No SOB, weakness, slurred speech or trouble swallowing. Has skin CA removed on leg (recurrent from 3 years ago). Has annual dermatology eval. Tolerating imuran  150mg  daily + mestinon  30mg  TID. Doing better with OSA and CPAP.  UPDATE 04/09/13: Continues to do well on imuran  150mg  daily. No side effects. Has been to PCP (Dr. Shayne) for annual physical; PSA slightly elevated and has follow up. Patient has gained some weight (he thinks due to inactivity). Otherwise no concerns. No SOB, weakness, numbness.  UPDATE 12/29/12: Doing well on imuran  150mg  daily. No new sxs. No SOB. No weakness. Working in the yard.  UPDATE 11/06/12: Doing well on imuran  100mg  daily. No MG flares. Left ptosis is better. No mania or mood disturbances. Last labs were improved. Taking mestinon  30mg  TID. Sometimes has diarrhea, and then takes 15mg  dose.  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 He  was kindly and expertly managed by medical and neurohospitalists, and tapered off prednisone , treated with IVIG, and then started on imuran . Doing well now. Mania is almost fully resolved. Still a bit hyper per wife, but much improved. Platelets trended down near end of hospital  stay (last 120). Here for follow up labs as well.  UPDATE 09/10/12: Since last visit, doing much better on prednisone . Now on prednisone  60mg  daily. Taking calcium  + Vit D, omeprazole. No side effects, except for slightly buzzy mood. Sleep apnea better. Strength improved. Almost back to normal. Some left ptosis persists. Some chewing difficulty towards end of meals.  UPDATE 08/13/12: Since last visit, EMG and lab testing confirm myasthenia gravis diagnosis. I prescribed Mestinon  for patient. He has had a positive response. He tried 30 mg 3 times a day, but now takes 15 mg 6 times a day, do to see increasing secretions and diarrhea. Medications lasting approximately 4 hours. No shortness of breath. Continues to have left ptosis, some neck weakness and left arm weakness. Some mild slurred speech. No trouble swallowing.  PRIOR HPI (08/06/12): 83 year old right-handed male with history of hypertension, seasonal allergies, here for evaluation of possible myasthenia gravis. 6 weeks ago patient noted left eyelid drooping. No double vision. Over the next few weeks he had increasing weakness in keeping his head upright as well as difficulty with talking and chewing. Since this past Saturday symptoms have been significantly worse. He denies any shortness of breath or choking problems. He has mild weakness in his arms and no weakness in his legs. No headache, confusion, chest pain. No similar symptoms in the past. Patient does have diagnosis of sleep apnea, but he has not used his CPAP machine last 2 or 3 years.   REVIEW OF SYSTEMS: Full 14 system review of systems performed and negative: as per HPI.    ALLERGIES: Allergies  Allergen Reactions   Prednisone  Other (See Comments)    manic   Penicillins Rash   Risperidone  And Paliperidone Other (See Comments)    hyperactivity    HOME MEDICATIONS:  Outpatient Encounter Medications as of 01/26/2024  Medication Sig   calcium -vitamin D  (OSCAL WITH D) 500-200  MG-UNIT per tablet Take 1 tablet by mouth 2 (two) times daily.   cholecalciferol  (VITAMIN D ) 1000 UNITS tablet Take 500 Units by mouth daily.    co-enzyme Q-10 30 MG capsule Take 30 mg by mouth 3 (three) times daily.   Cyanocobalamin (VITAMIN B-12 CR PO) Take 1,250 mg by mouth daily.   Multiple Vitamin (MULTIVITAMIN WITH MINERALS) TABS Take 1 tablet by mouth daily.   ramipril  (ALTACE ) 10 MG capsule Take 10 mg by mouth daily.   rosuvastatin (CRESTOR) 20 MG tablet Take 20 mg by mouth daily.   [DISCONTINUED] azaTHIOprine  (IMURAN ) 50 MG tablet Take 2 tablets (100 mg total) by mouth daily.   [DISCONTINUED] pyridostigmine  (MESTINON ) 60 MG tablet Take 0.5 tablets (30 mg total) by mouth 3 (three) times daily.   azaTHIOprine  (IMURAN ) 50 MG tablet Take 2 tablets (100 mg total) by mouth daily.   pyridostigmine  (MESTINON ) 60 MG tablet Take 0.5 tablets (30 mg total) by mouth 3 (three) times daily.   [DISCONTINUED] clindamycin  (CLEOCIN ) 300 MG capsule Take 1 capsule (300 mg total) by mouth 4 (four) times daily.   No facility-administered encounter medications on file as of 01/26/2024.     PAST MEDICAL HISTORY: Past Medical History:  Diagnosis Date   CPAP (continuous positive airway pressure) dependence    GERD (gastroesophageal reflux disease)  Hypertension    Myasthenia gravis (HCC)    Prostate cancer (HCC)    Sleep apnea    Squamous carcinoma     PAST SURGICAL HISTORY: Past Surgical History:  Procedure Laterality Date   COLONOSCOPY     Skin cancer removal     multiple    FAMILY HISTORY: Family History  Problem Relation Age of Onset   Heart disease Mother    Cancer - Other Father    Heart disease Sister    Heart disease Brother    Bipolar disorder Sister    Bipolar disorder Sister        suicide   Healthy Brother     SOCIAL HISTORY:  Social History   Socioeconomic History   Marital status: Married    Spouse name: [name] Lambert Jeanty   Number of children: 3   Years of  education: MD   Highest education level: Not on file  Occupational History   Occupation: Retired    Comment: Radiology MD  Tobacco Use   Smoking status: Never   Smokeless tobacco: Never  Vaping Use   Vaping status: Never Used  Substance and Sexual Activity   Alcohol  use: Yes    Comment: seldom   Drug use: No   Sexual activity: Not on file  Other Topics Concern   Not on file  Social History Narrative   Patient is married ([name]) and lives at home with his wife.   Patient has three adult children.   Patient is retired, Marine scientist.   Married.  Ambulates unassisted.   Caffeine Use- 4 cups daily.   Patient is right-handed.   Patient has a college education.   Social Drivers of Corporate investment banker Strain: Not on file  Food Insecurity: Not on file  Transportation Needs: Not on file  Physical Activity: Not on file  Stress: Not on file  Social Connections: Not on file  Intimate Partner Violence: Not on file     PHYSICAL EXAM  GENERAL EXAM/CONSTITUTIONAL: Vitals:  Vitals:   01/26/24 1401  BP: 133/66  Pulse: 60  Weight: 190 lb (86.2 kg)  Height: 5' 9 (1.753 m)    Body mass index is 28.06 kg/m. Wt Readings from Last 3 Encounters:  01/26/24 190 lb (86.2 kg)  07/25/23 198 lb 10.2 oz (90.1 kg)  07/15/23 198 lb 9.6 oz (90.1 kg)   Patient is in no distress; well developed, nourished and groomed; neck is supple  CARDIOVASCULAR: Examination of carotid arteries is normal; no carotid bruits Regular rate and rhythm, no murmurs Examination of peripheral vascular system by observation and palpation is normal  EYES: Ophthalmoscopic exam of optic discs and posterior segments is normal; no papilledema or hemorrhages No results found.  MUSCULOSKELETAL: Gait, strength, tone, movements noted in Neurologic exam below  NEUROLOGIC: MENTAL STATUS:     01/14/2023    8:20 AM  MMSE - Mini Mental State Exam  Orientation to time 5  Orientation to Place 5   Registration 3  Attention/ Calculation 1  Recall 0  Language- name 2 objects 2  Language- repeat 1  Language- follow 3 step command 3  Language- read & follow direction 1  Write a sentence 1  Copy design 1  Total score 23   awake, alert, oriented to person, place and time recent and remote memory intact normal attention and concentration language fluent, comprehension intact, naming intact fund of knowledge appropriate  CRANIAL NERVE:  2nd, 3rd, 4th, 6th - pupils equal and  reactive to light, visual fields full to confrontation, extraocular muscles intact, no nystagmus --> EXCEPT MILD LEFT PTOSIS 5th - facial sensation symmetric 7th - facial strength symmetric 8th - hearing intact 9th - palate elevates symmetrically, uvula midline 11th - shoulder shrug symmetric 12th - tongue protrusion midline  MOTOR:  normal bulk and tone, full strength in the BUE, BLE  SENSORY:  normal and symmetric to light touch  COORDINATION:  finger-nose-finger, fine finger movements normal  REFLEXES:  deep tendon reflexes TRACE and symmetric  GAIT/STATION:  narrow based gait     DIAGNOSTIC DATA (LABS, IMAGING, TESTING) - I reviewed patient records, labs, notes, testing and imaging myself where available.  Lab Results  Component Value Date   WBC 4.6 04/07/2019   HGB 11.9 (L) 04/07/2019   HCT 33.9 (L) 04/07/2019   MCV 102 (H) 04/07/2019   PLT 123 (L) 04/07/2019      Latest Ref Rng & Units 02/23/2019    9:11 AM 08/01/2015   10:03 AM 08/03/2014   10:56 AM  CMP  Glucose 65 - 99 mg/dL 895  93  92   BUN 8 - 27 mg/dL 22  28  22    Creatinine 0.76 - 1.27 mg/dL 8.56  8.60  8.68   Sodium 134 - 144 mmol/L 140  141  140   Potassium 3.5 - 5.2 mmol/L 5.1  4.6  4.8   Chloride 96 - 106 mmol/L 104  101  102   CO2 20 - 29 mmol/L 22  23  22    Calcium  8.6 - 10.2 mg/dL 9.4  9.6  9.1   Total Protein 6.0 - 8.5 g/dL 7.0  6.7  6.9   Total Bilirubin 0.0 - 1.2 mg/dL 0.5  0.7  0.6   Alkaline Phos 39 - 117  IU/L 60  50  50   AST 0 - 40 IU/L 24  24  20    ALT 0 - 44 IU/L 15  13  17      08/05/12 AchR binding 33.80 (h), AchR blocking 36% (h), AchR modulating 38% (h), anti-striational (1:80 (h)  08/05/12 B12 643, TSH 4.07, FT4 1.1, BUN/Cr 21/1.7  08/06/12 CT chest - no thymoma, small nodules within the thyroid  gland are likely benign, left lower lobe pulmonary nodule, Bosniak I cyst of the right kidney is partially imaged, coronary artery calcifications.  08/06/12 EMG/NCS  1. Decremental response on repetitive nerve stimulation study of greater than 30%, consistent with neuromuscular junction disorder such as myasthenia gravis. 2. No evidence of underlying myopathy or motor neuropathy. 3. Decreased motor unit recruitment of the left flexor carpi radialis muscle may indicate underlying cervical radiculopathy (C6, C7), but not confirmed on paraspinal muscle testing or nerve conduction studies.  01/21/14 CBC (WBC 4.5, Plt 207, LFTs normal)  09/22/12 MRI brain No acute intracranial abnormality.  Sinusitis.   01/18/23 MRI brain (without) demonstrating: -Diffuse pachymeningeal thickening measuring up to 4 mm in thickness.  4 to 5 mm of cerebellar tonsillar ectopia.  May be related to intracranial hypotension.  Other causes such as autoimmune, inflammatory or infectious etiologies are less likely. -Chronic pansinusitis changes. -Remainder of brain parenchyma is unremarkable.  01/14/23 ATN panel Component Ref Range & Units (hover) 6 mo ago  A -- Beta-amyloid 42/40 Ratio 0.111  Beta-amyloid 42 27.90  Beta-amyloid 40 251.04  T -- p-tau181 2.52 High   N -- NfL, Plasma 3.44  ATN SUMMARY Comment  Comment:  A- T+ N- A high [id number] concentration was observed. A normal beta-amyloid 42/40 ratio and normal concentration of NfL were observed at this time. These results are not consistent with the presence of Alzheimer's- related pathology, but may indicate pathology of another condition.      ASSESSMENT AND PLAN  83 y.o. year old male  has a past medical history of CPAP (continuous positive airway pressure) dependence, GERD (gastroesophageal reflux disease), Hypertension, Myasthenia gravis (HCC), Prostate cancer (HCC), Sleep apnea, and Squamous carcinoma. here with myasthenia gravis based on history and exam, EMG and lab testing. Did well with mestinon  and prednisone  until he developed steroid psychosis/mania. Now transitioned to imuran  and doing well since July 2014.  Problem List: Myasthenia gravis - stable Steroid psychosis/mania - RESOLVED Thrombocytopenia - improved Bradycardia and irregular heart rate - RESOLVED  Dx:  No diagnosis found.     PLAN:  MYASTHENIA GRAVIS (established, stable) - stable; continue pyridostigmine  30mg  three times a day + imuran  100mg  daily - monitor labs per PCP (for imuran ) q6 months  MEMORY LOSS (MCI vs mild dementia; since ~2022) - consider memantine 10mg  at bedtime; increase to twice a day after 1-2 weeks - safety / supervision issues reviewed - daily physical activity / exercise (at least 15-30 minutes) - eat more plants / vegetables - increase social activities, brain stimulation, games, puzzles, hobbies, crafts, arts, music - aim for at least 7-8 hours sleep per night (or more) - avoid smoking and alcohol  - caution with medications, finances, driving; needs more supervision  OSA --> resolved  - stable; per Dr. Chalice  Meds ordered this encounter  Medications   pyridostigmine  (MESTINON ) 60 MG tablet    Sig: Take 0.5 tablets (30 mg total) by mouth 3 (three) times daily.    Dispense:  150 tablet    Refill:  4   azaTHIOprine  (IMURAN ) 50 MG tablet    Sig: Take 2 tablets (100 mg total) by mouth daily.    Dispense:  180 tablet    Refill:  4   Return in about 6 months (around 07/25/2024).     EDUARD FABIENE HANLON, MD 01/26/2024, 2:25 PM Certified in Neurology, Neurophysiology and Neuroimaging  [facility] [address], [address] [place], [place] [address] [phone]

## 2024-06-29 ENCOUNTER — Ambulatory Visit: Admitting: Diagnostic Neuroimaging

## 2024-07-26 ENCOUNTER — Ambulatory Visit: Admitting: Diagnostic Neuroimaging
